# Patient Record
Sex: Female | Born: 1955 | Race: White | Hispanic: No | Smoking: Current every day smoker
Health system: Southern US, Community
[De-identification: ages and names within clinical notes are randomized; demographics above are authoritative.]

## PROBLEM LIST (undated history)

## (undated) DIAGNOSIS — K219 Gastro-esophageal reflux disease without esophagitis: Secondary | ICD-10-CM

## (undated) DIAGNOSIS — F32A Depression, unspecified: Secondary | ICD-10-CM

## (undated) DIAGNOSIS — Z22322 Carrier or suspected carrier of Methicillin resistant Staphylococcus aureus: Secondary | ICD-10-CM

## (undated) DIAGNOSIS — F419 Anxiety disorder, unspecified: Secondary | ICD-10-CM

## (undated) DIAGNOSIS — K589 Irritable bowel syndrome without diarrhea: Secondary | ICD-10-CM

## (undated) DIAGNOSIS — I8390 Asymptomatic varicose veins of unspecified lower extremity: Secondary | ICD-10-CM

## (undated) DIAGNOSIS — R0602 Shortness of breath: Secondary | ICD-10-CM

## (undated) DIAGNOSIS — J189 Pneumonia, unspecified organism: Secondary | ICD-10-CM

## (undated) DIAGNOSIS — N200 Calculus of kidney: Secondary | ICD-10-CM

## (undated) DIAGNOSIS — B182 Chronic viral hepatitis C: Secondary | ICD-10-CM

## (undated) DIAGNOSIS — L409 Psoriasis, unspecified: Secondary | ICD-10-CM

## (undated) DIAGNOSIS — K7460 Unspecified cirrhosis of liver: Secondary | ICD-10-CM

## (undated) DIAGNOSIS — F329 Major depressive disorder, single episode, unspecified: Secondary | ICD-10-CM

## (undated) DIAGNOSIS — M1990 Unspecified osteoarthritis, unspecified site: Secondary | ICD-10-CM

## (undated) DIAGNOSIS — M797 Fibromyalgia: Secondary | ICD-10-CM

## (undated) DIAGNOSIS — I10 Essential (primary) hypertension: Secondary | ICD-10-CM

## (undated) HISTORY — PX: COLONOSCOPY W/ BIOPSIES AND POLYPECTOMY: SHX1376

## (undated) HISTORY — PX: DIAGNOSTIC LAPAROSCOPY: SUR761

## (undated) HISTORY — PX: APPENDECTOMY: SHX54

## (undated) HISTORY — PX: ABDOMINAL HYSTERECTOMY: SHX81

## (undated) HISTORY — PX: HYSTEROSCOPY: SHX211

---

## 2001-06-13 ENCOUNTER — Emergency Department (HOSPITAL_COMMUNITY): Admission: EM | Admit: 2001-06-13 | Discharge: 2001-06-13 | Payer: Self-pay | Admitting: *Deleted

## 2001-06-13 ENCOUNTER — Encounter: Payer: Self-pay | Admitting: *Deleted

## 2001-08-30 ENCOUNTER — Emergency Department (HOSPITAL_COMMUNITY): Admission: EM | Admit: 2001-08-30 | Discharge: 2001-08-30 | Payer: Self-pay | Admitting: Emergency Medicine

## 2002-04-11 ENCOUNTER — Emergency Department (HOSPITAL_COMMUNITY): Admission: EM | Admit: 2002-04-11 | Discharge: 2002-04-11 | Payer: Self-pay | Admitting: *Deleted

## 2002-10-23 ENCOUNTER — Emergency Department (HOSPITAL_COMMUNITY): Admission: EM | Admit: 2002-10-23 | Discharge: 2002-10-24 | Payer: Self-pay | Admitting: *Deleted

## 2002-10-23 ENCOUNTER — Encounter: Payer: Self-pay | Admitting: *Deleted

## 2003-05-30 ENCOUNTER — Emergency Department (HOSPITAL_COMMUNITY): Admission: EM | Admit: 2003-05-30 | Discharge: 2003-05-30 | Payer: Self-pay | Admitting: Emergency Medicine

## 2007-07-27 ENCOUNTER — Emergency Department (HOSPITAL_COMMUNITY): Admission: EM | Admit: 2007-07-27 | Discharge: 2007-07-28 | Payer: Self-pay | Admitting: Emergency Medicine

## 2010-01-22 ENCOUNTER — Emergency Department (HOSPITAL_COMMUNITY): Admission: EM | Admit: 2010-01-22 | Discharge: 2010-01-22 | Payer: Self-pay | Admitting: Emergency Medicine

## 2010-01-24 ENCOUNTER — Emergency Department (HOSPITAL_COMMUNITY): Admission: EM | Admit: 2010-01-24 | Discharge: 2010-01-24 | Payer: Self-pay | Admitting: Emergency Medicine

## 2010-01-26 ENCOUNTER — Emergency Department (HOSPITAL_COMMUNITY): Admission: EM | Admit: 2010-01-26 | Discharge: 2010-01-27 | Payer: Self-pay | Admitting: Emergency Medicine

## 2010-01-29 ENCOUNTER — Emergency Department (HOSPITAL_COMMUNITY): Admission: EM | Admit: 2010-01-29 | Discharge: 2010-01-30 | Payer: Self-pay | Admitting: Emergency Medicine

## 2010-02-12 ENCOUNTER — Emergency Department (HOSPITAL_COMMUNITY): Admission: EM | Admit: 2010-02-12 | Discharge: 2010-02-12 | Payer: Self-pay | Admitting: Emergency Medicine

## 2010-02-12 ENCOUNTER — Ambulatory Visit: Payer: Self-pay | Admitting: Vascular Surgery

## 2010-02-12 ENCOUNTER — Ambulatory Visit (HOSPITAL_COMMUNITY): Admission: RE | Admit: 2010-02-12 | Discharge: 2010-02-12 | Payer: Self-pay | Admitting: Emergency Medicine

## 2010-02-12 ENCOUNTER — Encounter (INDEPENDENT_AMBULATORY_CARE_PROVIDER_SITE_OTHER): Payer: Self-pay | Admitting: Emergency Medicine

## 2010-10-20 LAB — DIFFERENTIAL
Basophils Absolute: 0.1 10*3/uL (ref 0.0–0.1)
Basophils Relative: 1 % (ref 0–1)
Lymphocytes Relative: 24 % (ref 12–46)
Monocytes Absolute: 0.9 10*3/uL (ref 0.1–1.0)
Neutro Abs: 7.5 10*3/uL (ref 1.7–7.7)
Neutrophils Relative %: 67 % (ref 43–77)

## 2010-10-20 LAB — POCT I-STAT, CHEM 8
BUN: 9 mg/dL (ref 6–23)
Calcium, Ion: 1.09 mmol/L — ABNORMAL LOW (ref 1.12–1.32)
Chloride: 105 mEq/L (ref 96–112)
Creatinine, Ser: 0.6 mg/dL (ref 0.4–1.2)
Glucose, Bld: 93 mg/dL (ref 70–99)
HCT: 48 % — ABNORMAL HIGH (ref 36.0–46.0)
Hemoglobin: 16.3 g/dL — ABNORMAL HIGH (ref 12.0–15.0)
Potassium: 4.5 mEq/L (ref 3.5–5.1)
Sodium: 139 mEq/L (ref 135–145)
TCO2: 24 mmol/L (ref 0–100)

## 2010-10-20 LAB — CBC
HCT: 45.9 % (ref 36.0–46.0)
Hemoglobin: 15 g/dL (ref 12.0–15.0)
MCV: 92.4 fL (ref 78.0–100.0)
Platelets: 194 10*3/uL (ref 150–400)
RDW: 14 % (ref 11.5–15.5)

## 2010-10-20 LAB — COMPREHENSIVE METABOLIC PANEL
Albumin: 3.8 g/dL (ref 3.5–5.2)
BUN: 8 mg/dL (ref 6–23)
Creatinine, Ser: 0.65 mg/dL (ref 0.4–1.2)
Glucose, Bld: 126 mg/dL — ABNORMAL HIGH (ref 70–99)
Total Bilirubin: 1.4 mg/dL — ABNORMAL HIGH (ref 0.3–1.2)
Total Protein: 8.8 g/dL — ABNORMAL HIGH (ref 6.0–8.3)

## 2010-10-20 LAB — CULTURE, ROUTINE-ABSCESS

## 2010-11-29 ENCOUNTER — Emergency Department (HOSPITAL_COMMUNITY): Payer: Self-pay

## 2010-11-29 ENCOUNTER — Emergency Department (HOSPITAL_COMMUNITY)
Admission: EM | Admit: 2010-11-29 | Discharge: 2010-11-29 | Disposition: A | Discharge status: Home / Self Care | Payer: Self-pay | Attending: Emergency Medicine | Admitting: Emergency Medicine

## 2010-11-29 DIAGNOSIS — Y92009 Unspecified place in unspecified non-institutional (private) residence as the place of occurrence of the external cause: Secondary | ICD-10-CM | POA: Insufficient documentation

## 2010-11-29 DIAGNOSIS — W108XXA Fall (on) (from) other stairs and steps, initial encounter: Secondary | ICD-10-CM | POA: Insufficient documentation

## 2010-11-29 DIAGNOSIS — S8990XA Unspecified injury of unspecified lower leg, initial encounter: Secondary | ICD-10-CM | POA: Insufficient documentation

## 2010-11-29 DIAGNOSIS — M25469 Effusion, unspecified knee: Secondary | ICD-10-CM | POA: Insufficient documentation

## 2011-03-10 ENCOUNTER — Encounter: Payer: Self-pay | Admitting: Oncology

## 2011-03-10 ENCOUNTER — Emergency Department (HOSPITAL_COMMUNITY): Payer: Self-pay

## 2011-03-10 ENCOUNTER — Emergency Department (HOSPITAL_COMMUNITY)
Admission: EM | Admit: 2011-03-10 | Discharge: 2011-03-10 | Disposition: A | Discharge status: Home / Self Care | Payer: Self-pay | Attending: Emergency Medicine | Admitting: Emergency Medicine

## 2011-03-10 DIAGNOSIS — J438 Other emphysema: Secondary | ICD-10-CM | POA: Insufficient documentation

## 2011-03-10 DIAGNOSIS — S63619A Unspecified sprain of unspecified finger, initial encounter: Secondary | ICD-10-CM

## 2011-03-10 DIAGNOSIS — Z760 Encounter for issue of repeat prescription: Secondary | ICD-10-CM | POA: Insufficient documentation

## 2011-03-10 DIAGNOSIS — K589 Irritable bowel syndrome without diarrhea: Secondary | ICD-10-CM | POA: Insufficient documentation

## 2011-03-10 DIAGNOSIS — M79609 Pain in unspecified limb: Secondary | ICD-10-CM | POA: Insufficient documentation

## 2011-03-10 DIAGNOSIS — S6390XA Sprain of unspecified part of unspecified wrist and hand, initial encounter: Secondary | ICD-10-CM | POA: Insufficient documentation

## 2011-03-10 HISTORY — DX: Psoriasis, unspecified: L40.9

## 2011-03-10 HISTORY — DX: Unspecified osteoarthritis, unspecified site: M19.90

## 2011-03-10 HISTORY — DX: Chronic viral hepatitis C: B18.2

## 2011-03-10 HISTORY — DX: Gastro-esophageal reflux disease without esophagitis: K21.9

## 2011-03-10 HISTORY — DX: Irritable bowel syndrome, unspecified: K58.9

## 2011-03-10 HISTORY — DX: Carrier or suspected carrier of methicillin resistant Staphylococcus aureus: Z22.322

## 2011-03-10 MED ORDER — IBUPROFEN 800 MG PO TABS
800.0000 mg | ORAL_TABLET | Freq: Once | ORAL | Status: AC
  Administered 2011-03-10: 800 mg via ORAL
  Filled 2011-03-10: qty 1

## 2011-03-10 MED ORDER — OXYCODONE HCL 5 MG PO TABS: 5.0000 mg | ORAL_TABLET | ORAL | Status: AC | PRN

## 2011-03-10 MED ORDER — ALBUTEROL SULFATE HFA 108 (90 BASE) MCG/ACT IN AERS: 1.0000 | INHALATION_SPRAY | Freq: Four times a day (QID) | RESPIRATORY_TRACT | Status: DC | PRN

## 2011-03-10 MED ORDER — OXYCODONE HCL 5 MG PO TABS
5.0000 mg | ORAL_TABLET | Freq: Once | ORAL | Status: AC
  Administered 2011-03-10: 5 mg via ORAL
  Filled 2011-03-10: qty 1

## 2011-03-10 MED ORDER — CEPHALEXIN 500 MG PO CAPS: 500.0000 mg | ORAL_CAPSULE | Freq: Four times a day (QID) | ORAL | Status: AC
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
## 2011-03-10 NOTE — ED Provider Notes (Signed)
Medical screening examination/treatment/procedure(s) were performed by non-physician practitioner and as supervising physician I was immediately available for consultation/collaboration.   Tammatha Cobb M Stephania Macfarlane, DO 03/10/11 1723 

## 2012-08-24 ENCOUNTER — Emergency Department (HOSPITAL_COMMUNITY): Payer: Medicaid Other

## 2012-08-24 ENCOUNTER — Encounter (HOSPITAL_COMMUNITY): Payer: Self-pay | Admitting: *Deleted

## 2012-08-24 ENCOUNTER — Emergency Department (HOSPITAL_COMMUNITY)
Admission: EM | Admit: 2012-08-24 | Discharge: 2012-08-24 | Disposition: A | Discharge status: Home / Self Care | Payer: Medicaid Other | Attending: Emergency Medicine | Admitting: Emergency Medicine

## 2012-08-24 DIAGNOSIS — Z872 Personal history of diseases of the skin and subcutaneous tissue: Secondary | ICD-10-CM | POA: Insufficient documentation

## 2012-08-24 DIAGNOSIS — R197 Diarrhea, unspecified: Secondary | ICD-10-CM | POA: Insufficient documentation

## 2012-08-24 DIAGNOSIS — R109 Unspecified abdominal pain: Secondary | ICD-10-CM | POA: Insufficient documentation

## 2012-08-24 DIAGNOSIS — K219 Gastro-esophageal reflux disease without esophagitis: Secondary | ICD-10-CM | POA: Insufficient documentation

## 2012-08-24 DIAGNOSIS — Z8614 Personal history of Methicillin resistant Staphylococcus aureus infection: Secondary | ICD-10-CM | POA: Insufficient documentation

## 2012-08-24 DIAGNOSIS — Z8739 Personal history of other diseases of the musculoskeletal system and connective tissue: Secondary | ICD-10-CM | POA: Insufficient documentation

## 2012-08-24 DIAGNOSIS — M545 Low back pain, unspecified: Secondary | ICD-10-CM | POA: Insufficient documentation

## 2012-08-24 DIAGNOSIS — B182 Chronic viral hepatitis C: Secondary | ICD-10-CM | POA: Insufficient documentation

## 2012-08-24 DIAGNOSIS — J449 Chronic obstructive pulmonary disease, unspecified: Secondary | ICD-10-CM

## 2012-08-24 DIAGNOSIS — J4489 Other specified chronic obstructive pulmonary disease: Secondary | ICD-10-CM | POA: Insufficient documentation

## 2012-08-24 DIAGNOSIS — F17200 Nicotine dependence, unspecified, uncomplicated: Secondary | ICD-10-CM | POA: Insufficient documentation

## 2012-08-24 DIAGNOSIS — K589 Irritable bowel syndrome without diarrhea: Secondary | ICD-10-CM | POA: Insufficient documentation

## 2012-08-24 DIAGNOSIS — R112 Nausea with vomiting, unspecified: Secondary | ICD-10-CM | POA: Insufficient documentation

## 2012-08-24 LAB — COMPREHENSIVE METABOLIC PANEL
ALT: 71 U/L — ABNORMAL HIGH (ref 0–35)
AST: 95 U/L — ABNORMAL HIGH (ref 0–37)
Alkaline Phosphatase: 90 U/L (ref 39–117)
CO2: 26 mEq/L (ref 19–32)
Calcium: 9.3 mg/dL (ref 8.4–10.5)
Chloride: 101 mEq/L (ref 96–112)
GFR calc Af Amer: >90 mL/min (ref >90)
GFR calc non Af Amer: >90 mL/min (ref >90)
Glucose, Bld: 89 mg/dL (ref 70–99)
Potassium: 4.1 mEq/L (ref 3.5–5.1)
Sodium: 137 mEq/L (ref 135–145)

## 2012-08-24 LAB — CBC WITH DIFFERENTIAL/PLATELET
Basophils Absolute: 0 10*3/uL (ref 0.0–0.1)
Eosinophils Relative: 1 % (ref 0–5)
Lymphocytes Relative: 33 % (ref 12–46)
Lymphs Abs: 1.5 10*3/uL (ref 0.7–4.0)
MCV: 94 fL (ref 78.0–100.0)
Neutro Abs: 2.7 10*3/uL (ref 1.7–7.7)
Neutrophils Relative %: 57 % (ref 43–77)
Platelets: 109 10*3/uL — ABNORMAL LOW (ref 150–400)
RBC: 4.53 MIL/uL (ref 3.87–5.11)
RDW: 14.2 % (ref 11.5–15.5)
WBC: 4.7 10*3/uL (ref 4.0–10.5)

## 2012-08-24 LAB — D-DIMER, QUANTITATIVE: D-Dimer, Quant: <0.27 ug/mL-FEU (ref 0.00–0.48)

## 2012-08-24 LAB — URINALYSIS, ROUTINE W REFLEX MICROSCOPIC
Hgb urine dipstick: NEGATIVE
Leukocytes, UA: NEGATIVE
Nitrite: NEGATIVE
Protein, ur: NEGATIVE mg/dL
Specific Gravity, Urine: 1.025 (ref 1.005–1.030)
Urobilinogen, UA: 2 mg/dL — ABNORMAL HIGH (ref 0.0–1.0)

## 2012-08-24 MED ORDER — IBUPROFEN 800 MG PO TABS: 800.0000 mg | ORAL_TABLET | Freq: Three times a day (TID) | ORAL | Status: DC

## 2012-08-24 MED ORDER — MORPHINE SULFATE 4 MG/ML IJ SOLN
4.0000 mg | Freq: Once | INTRAMUSCULAR | Status: AC
  Administered 2012-08-24: 4 mg via INTRAVENOUS
  Filled 2012-08-24: qty 1

## 2012-08-24 MED ORDER — ONDANSETRON HCL 4 MG/2ML IJ SOLN
4.0000 mg | Freq: Once | INTRAMUSCULAR | Status: AC
  Administered 2012-08-24: 4 mg via INTRAVENOUS
  Filled 2012-08-24: qty 2

## 2012-08-24 MED ORDER — OXYCODONE-ACETAMINOPHEN 5-325 MG PO TABS: 2.0000 | ORAL_TABLET | ORAL | Status: DC | PRN

## 2012-08-24 MED ORDER — SODIUM CHLORIDE 0.9 % IV BOLUS (SEPSIS)
1000.0000 mL | Freq: Once | INTRAVENOUS | Status: AC
  Administered 2012-08-24: 1000 mL via INTRAVENOUS

## 2012-08-24 MED ORDER — ALBUTEROL SULFATE HFA 108 (90 BASE) MCG/ACT IN AERS
2.0000 | INHALATION_SPRAY | Freq: Once | RESPIRATORY_TRACT | Status: AC
  Administered 2012-08-24: 2 via RESPIRATORY_TRACT
  Filled 2012-08-24: qty 6.7

## 2012-08-24 MED ORDER — ALBUTEROL SULFATE HFA 108 (90 BASE) MCG/ACT IN AERS: 2.0000 | INHALATION_SPRAY | RESPIRATORY_TRACT | Status: DC | PRN

## 2012-08-24 NOTE — ED Notes (Signed)
abd pain, and pain in knees,  Cough, no vomiting in last 3 days.  Diarrhea today.  No  injury

## 2012-08-24 NOTE — ED Notes (Signed)
MD at bedside. 

## 2012-08-24 NOTE — ED Provider Notes (Signed)
History  This chart was scribed for Glynn Octave, MD by Erskine Emery, ED Scribe. This patient was seen in room [facility]16A[facility] and the patient's care was started at 15:19.   CSN: [id number]  Arrival date & time 08/24/12  1133   First MD Initiated Contact with Patient 08/24/12 1519      Chief Complaint  Patient presents with  . Abdominal Pain    (Consider location/radiation/quality/duration/timing/severity/associated sxs/prior treatment) The history is provided by the patient. No language interpreter was used.  Kristen Marsh is a 57 y.o. female who presents to the Emergency Department complaining of intermittent pain in the lower back that radiates in a straight line with sharp pain into the abdomen for about 3 days. Pt reports the pain is worse in the mornings. She also reports some back pain that radiates into the shoulders and chest that is constant for the last couple days. Pt reports some headache when waking every morning, a cough, emesis (last episode was Saturday--4 days ago), and weakness but denies any associated fever, dysuira, hematuria, vaginal bleeding or vaginal discharge, or known associated injury. She has a h/o COPD, emphysema, arthritis, right knee pain, hepatitis C (from shooting up a few years ago), bronchitis, and kidneys stones many years ago. She thinks she may have DM but has never been formally diagnosed. She had an evaluation for possible cholecyst infection a few years ago but they did not operate. She has had an appendectomy and hysterectomy but she still has her ovaries.  Pt just got medicaid and doesn't have PCP yet.  Past Medical History  Diagnosis Date  . Osteoarthritis   . Hep C w/o coma, chronic   . Emphysema   . GERD (gastroesophageal reflux disease)   . IBS (irritable bowel syndrome)   . Psoriasis   . MRSA carrier     Past Surgical History  Procedure Date  . Hysteroscopy   . Appendectomy   . Abdominal hysterectomy     History reviewed. No  pertinent family history.  History  Substance Use Topics  . Smoking status: Current Every Day Smoker  . Smokeless tobacco: Not on file  . Alcohol Use: Yes    OB History    Grav Para Term Preterm Abortions TAB SAB Ect Mult Living                  Review of Systems A complete 10 system review of systems was obtained and all systems are negative except as noted in the HPI and PMH.    Allergies  Hydrocodone-acetaminophen  Home Medications   Current Outpatient Rx  Name  Route  Sig  Dispense  Refill  . ALBUTEROL SULFATE HFA 108 (90 BASE) MCG/ACT IN AERS   Inhalation   Inhale 1-2 puffs into the lungs every 6 (six) hours as needed for wheezing.   1 Inhaler   0   . ASPIRIN-SALICYLAMIDE-CAFFEINE 650-195-33.3 MG PO PACK   Oral   Take 1 packet by mouth daily as needed. For pain         . CIMETIDINE 200 MG PO TABS   Oral   Take 200 mg by mouth daily as needed. For acid reducer         . ESOMEPRAZOLE MAGNESIUM 40 MG PO CPDR   Oral   Take 40 mg by mouth daily before breakfast.           BP 152/84  Pulse 82  Temp 98 F (36.7 C) (Oral)  Resp 20  SpO2 95%  Physical Exam  Nursing note and vitals reviewed. Constitutional: She is oriented to person, place, and time. She appears well-developed and well-nourished. No distress.  HENT:  Head: Normocephalic and atraumatic.  Eyes: EOM are normal. Pupils are equal, round, and reactive to light.  Neck: Normal range of motion. Neck supple. No tracheal deviation present.  Cardiovascular: Normal rate.   Pulmonary/Chest: Effort normal. No respiratory distress. She has no wheezes. She exhibits tenderness.       Lungs are clear. Reproducible right sided chest pain.  Abdominal: Soft. She exhibits no distension. There is tenderness.       Right CVA tenderness. Hepatamegaly with RUQ tenderness.  Musculoskeletal: Normal range of motion. She exhibits no edema.       5/5 strength in bilateral lower extremities. Ankle plantar and  dorsiflexion intact. Great toe extension intact bilaterally. +2 DP and PT pulses. +2 patellar reflexes bilaterally. Normal gait.  Neurological: She is alert and oriented to person, place, and time.  Skin: Skin is warm and dry.  Psychiatric: She has a normal mood and affect.    ED Course  Procedures (including critical care time) DIAGNOSTIC STUDIES: Oxygen Saturation is 95% on room air, adequate by my interpretation.    COORDINATION OF CARE: 15:25--I evaluated the patient and we discussed a treatment plan including X-ray of the chest, cholecyst ultrasound, urinalysis, blood work, and IV nausea medication to which the pt agreed. Pt requests an inhaler for her cough and a list of doctors that could be her new PCP.  17:51--I rechecked the pt and notified her of the results of her exams, including no pneumonia or kidney stones. I told her she needs to stop smoking and get a PCP. I explained that we would send her home with some pain medication and antiinflammatories.   Labs Reviewed  URINALYSIS, ROUTINE W REFLEX MICROSCOPIC - Abnormal; Notable for the following:    Urobilinogen, UA 2.0 (*)     All other components within normal limits  CBC WITH DIFFERENTIAL - Abnormal; Notable for the following:    Platelets 109 (*)     All other components within normal limits  COMPREHENSIVE METABOLIC PANEL - Abnormal; Notable for the following:    Total Protein 8.5 (*)     AST 95 (*)     ALT 71 (*)     Total Bilirubin 1.3 (*)     All other components within normal limits  LIPASE, BLOOD  TROPONIN I  D-DIMER, QUANTITATIVE   Dg Chest 2 View  08/24/2012  *RADIOLOGY REPORT*  Clinical Data: Cough  CHEST - 2 VIEW  Comparison: None.  Findings: Cardiomediastinal silhouette is unremarkable.  No acute infiltrate or pleural effusion.  No pulmonary edema.  Bony thorax is unremarkable.  IMPRESSION: No active disease.   Original Report Authenticated By: Natasha Mead, M.D.    US Abdomen Limited Ruq  08/24/2012   *RADIOLOGY REPORT*  Clinical Data:  57 year old female with right-sided abdominal pain.  LIMITED ABDOMINAL ULTRASOUND - RIGHT UPPER QUADRANT  Comparison:  None  Findings:  Gallbladder:  A tiny amount of sludge is noted. There is no evidence of cholelithiasis or cholecystitis.  Common bile duct:  There is no evidence of intrahepatic or extrahepatic biliary dilatation.  The visualized CBD is unremarkable measuring 1.6 mm in greatest diameter.  Liver:  Mildly heterogeneous increased parenchymal echogenicity is noted. The liver is enlarged measuring 19 cm in cranial caudal dimension. No focal hepatic masses are identified.  There is no evidence  of ascites within the upper right abdomen.  IMPRESSION: No evidence of acute abnormality.  Tiny amount of gallbladder sludge without evidence of cholecystitis or cholelithiasis.  Hepatomegaly with probable fatty infiltration of the liver.                    Original Report Authenticated By: Harmon Pier, M.D.      No diagnosis found.    MDM  3 history of right flank pain it radiates to the lower abdomen associated with nausea and loose stools. Denies dysuria, hematuria, vaginal bleeding or discharge. Remote history of kidney stones.  Coughing with chest pain, hx COPD. Atypical for ACS and PE. Hepatomegaly with hepatitis C history. Neuro exam nonfocal.   Urinalysis negative. No evidence of hematuria. Suspect back pain musculoskeletal as it is reproducible. She has equal strength in her lower extremities, no bowel or bladder incontinence, no saddle anesthesia or other red flags. Abdomen is soft and benign. Ultrasound was obtained given her hepatomegaly. Gallbladder appears to be normal.  Chest x-ray is clear she has a history of COPD without acute exacerbation. We'll continue her home medications. Referral for PCP given.   Date: 08/24/2012  Rate: 70  Rhythm: sinus arrhythmia  QRS Axis: normal  Intervals: normal  ST/T Wave abnormalities: normal  Conduction  Disutrbances:none  Narrative Interpretation:   Old EKG Reviewed: unchanged   I personally performed the services described in this documentation, which was scribed in my presence. The recorded information has been reviewed and is accurate.      Glynn Octave, MD 08/24/12 [id number]

## 2013-01-14 ENCOUNTER — Ambulatory Visit (INDEPENDENT_AMBULATORY_CARE_PROVIDER_SITE_OTHER): Payer: Medicaid Other | Admitting: General Practice

## 2013-01-14 ENCOUNTER — Encounter: Payer: Self-pay | Admitting: General Practice

## 2013-01-14 VITALS — BP 156/91 | HR 78 | Temp 97.4°F | Ht 62.75 in | Wt 140.0 lb

## 2013-01-14 DIAGNOSIS — J309 Allergic rhinitis, unspecified: Secondary | ICD-10-CM

## 2013-01-14 DIAGNOSIS — R079 Chest pain, unspecified: Secondary | ICD-10-CM

## 2013-01-14 DIAGNOSIS — Z7689 Persons encountering health services in other specified circumstances: Secondary | ICD-10-CM

## 2013-01-14 DIAGNOSIS — Z7189 Other specified counseling: Secondary | ICD-10-CM

## 2013-01-14 DIAGNOSIS — M129 Arthropathy, unspecified: Secondary | ICD-10-CM

## 2013-01-14 DIAGNOSIS — M1990 Unspecified osteoarthritis, unspecified site: Secondary | ICD-10-CM

## 2013-01-14 LAB — COMPLETE METABOLIC PANEL WITH GFR
ALT: 93 U/L — ABNORMAL HIGH (ref 0–35)
AST: 120 U/L — ABNORMAL HIGH (ref 0–37)
Alkaline Phosphatase: 98 U/L (ref 39–117)
CO2: 26 mEq/L (ref 19–32)
Creat: 0.67 mg/dL (ref 0.50–1.10)
GFR, Est African American: >89 mL/min
Total Bilirubin: 1.2 mg/dL (ref 0.3–1.2)

## 2013-01-14 LAB — POCT CBC
Lymph, poc: 2.6 (ref 0.6–3.4)
MCH, POC: 31.5 pg — AB (ref 27–31.2)
MCHC: 34.6 g/dL (ref 31.8–35.4)
MPV: 7.9 fL (ref 0–99.8)
POC Granulocyte: 7 — AB (ref 2–6.9)
POC LYMPH PERCENT: 25.7 %L (ref 10–50)
Platelet Count, POC: 133 10*3/uL — AB (ref 142–424)
RDW, POC: 15 %
WBC: 10.1 10*3/uL (ref 4.6–10.2)

## 2013-01-14 LAB — VITAMIN B12: Vitamin B-12: 905 pg/mL (ref 211–911)

## 2013-01-14 MED ORDER — MELOXICAM 7.5 MG PO TABS: 7.5000 mg | ORAL_TABLET | Freq: Every day | ORAL | Status: DC

## 2013-01-14 MED ORDER — OMEPRAZOLE 20 MG PO CPDR: 20.0000 mg | DELAYED_RELEASE_CAPSULE | Freq: Every day | ORAL | Status: DC

## 2013-01-14 MED ORDER — CETIRIZINE HCL 10 MG PO TABS: 10.0000 mg | ORAL_TABLET | Freq: Every day | ORAL | Status: DC

## 2013-01-14 NOTE — Patient Instructions (Signed)
Degenerative Arthritis You have osteoarthritis. This is the wear and tear arthritis that comes with aging. It is also called degenerative arthritis. This is common in people past middle age. It is caused by stress on the joints. The large weight bearing joints of the lower extremities are most often affected. The knees, hips, back, neck, and hands can become painful, swollen, and stiff. This is the most common type of arthritis. It comes on with age, carrying too much weight, or from an injury. Treatment includes resting the sore joint until the pain and swelling improve. Crutches or a walker may be needed for severe flares. Only take over-the-counter or prescription medicines for pain, discomfort, or fever as directed by your caregiver. Local heat therapy may improve motion. Cortisone shots into the joint are sometimes used to reduce pain and swelling during flares. Osteoarthritis is usually not crippling and progresses slowly. There are things you can do to decrease pain:  Avoid high impact activities.  Exercise regularly.  Low impact exercises such as walking, biking and swimming help to keep the muscles strong and keep normal joint function.  Stretching helps to keep your range of motion.  Lose weight if you are overweight. This reduces joint stress. In severe cases when you have pain at rest or increasing disability, joint surgery may be helpful. See your caregiver for follow-up treatment as recommended.  SEEK IMMEDIATE MEDICAL CARE IF:   You have severe joint pain.  Marked swelling and redness in your joint develops.  You develop a high fever. Document Released: 07/21/2005 Document Revised: 10/13/2011 Document Reviewed: 12/21/2006 [facility] Patient Information 2014 [place], [place]. Allergic Rhinitis Allergic rhinitis is when the mucous membranes in the nose respond to allergens. Allergens are particles in the air that cause your body to have an allergic reaction. This causes you to  release allergic antibodies. Through a chain of events, these eventually cause you to release histamine into the blood stream (hence the use of antihistamines). Although meant to be protective to the body, it is this release that causes your discomfort, such as frequent sneezing, congestion and an itchy runny nose.  CAUSES  The pollen allergens may come from grasses, trees, and weeds. This is seasonal allergic rhinitis, or "hay fever." Other allergens cause year-round allergic rhinitis (perennial allergic rhinitis) such as house dust mite allergen, pet dander and mold spores.  SYMPTOMS   Nasal stuffiness (congestion).  Runny, itchy nose with sneezing and tearing of the eyes.  There is often an itching of the mouth, eyes and ears. It cannot be cured, but it can be controlled with medications. DIAGNOSIS  If you are unable to determine the offending allergen, skin or blood testing may find it. TREATMENT   Avoid the allergen.  Medications and allergy shots (immunotherapy) can help.  Hay fever may often be treated with antihistamines in pill or nasal spray forms. Antihistamines block the effects of histamine. There are over-the-counter medicines that may help with nasal congestion and swelling around the eyes. Check with your caregiver before taking or giving this medicine. If the treatment above does not work, there are many new medications your caregiver can prescribe. Stronger medications may be used if initial measures are ineffective. Desensitizing injections can be used if medications and avoidance fails. Desensitization is when a patient is given ongoing shots until the body becomes less sensitive to the allergen. Make sure you follow up with your caregiver if problems continue. SEEK MEDICAL CARE IF:   You develop fever (more than 100.5  F (38.1 C).  You develop a cough that does not stop easily (persistent).  You have shortness of breath.  You start wheezing.  Symptoms interfere  with normal daily activities. Document Released: 04/15/2001 Document Revised: 10/13/2011 Document Reviewed: 10/25/2008 [facility] Patient Information 2014 [place], [place].

## 2013-01-14 NOTE — Progress Notes (Signed)
  Subjective:    Patient ID: Kristen Marsh, female    DOB: Sep 04, 1955, 57 y.o.   MRN: [id number]  HPI Presents today for establishment of care. She denies having a primary physician in many years and has recently been visiting urgent cares/emergency rooms. She reports being COPD and is currently using proventil. She reports smoking one pack daily. She denies trying any otc treatment for smoking cessation. Reports having a burning sensation after eating. Also belching. She reports a history of GERD. Reports having a headache at times, runny nose, and periodic watering of eyes. She reports taking antihistamines in the past. Reports being more stiff in morning and begins to loosen up as day goes on. Reports being seen recently by spine center/ortho and [facility]. She is currently receiving physical therapy, per patient for back pain.  Reports having a history of being a daily drinker of alcohol. Currently reports drinking mixed drink twice weekly.    Review of Systems  Constitutional: Negative for fever and chills.  HENT: Positive for neck pain and neck stiffness.        Reports seeing an orthopedic for neck and back pain, reports being effective  Respiratory: Positive for cough. Negative for chest tightness.   Cardiovascular: Negative for chest pain and palpitations.  Gastrointestinal: Negative for abdominal pain and blood in stool.  Genitourinary: Negative for dysuria, hematuria, flank pain and difficulty urinating.  Musculoskeletal: Positive for back pain.  Neurological: Negative for dizziness, weakness, numbness and headaches.       Objective:   Physical Exam  Constitutional: She is oriented to person, place, and time. She appears well-developed and well-nourished.  HENT:  Head: Normocephalic and atraumatic.  Right Ear: External ear normal.  Left Ear: External ear normal.  Eyes: EOM are normal.  Cardiovascular: Normal rate, regular rhythm and normal heart sounds.    Pulmonary/Chest: Effort normal. No respiratory distress. She exhibits no tenderness.  Abdominal: Soft. Bowel sounds are normal. She exhibits no distension. There is no tenderness.  Musculoskeletal:  Some deformity noted to fingers from arthritis. weak grip with bilateral hands/fingers,   Neurological: She is alert and oriented to person, place, and time.  Skin: Skin is warm and dry.  Psychiatric: She has a normal mood and affect.          Assessment & Plan:  -Provider will request medical records from [facility] in [place] and [place]  -Smoking cessation discussed (needs to try OTC treatment prior to prescription)  1. Establishing care with new doctor, encounter for - omeprazole (PRILOSEC) 20 MG capsule; Take 1 capsule (20 mg total) by mouth daily.  Dispense: 30 capsule; Refill: 0 - POCT CBC - COMPLETE METABOLIC PANEL WITH GFR - Vitamin D 25 hydroxy - Vitamin B12 - NMR Lipoprofile with Lipids  2. Osteoarthrosis, unspecified whether generalized or localized, involving lower leg - meloxicam (MOBIC) 7.5 MG tablet; Take 1 tablet (7.5 mg total) by mouth daily.  Dispense: 30 tablet; Refill: 0  3. Allergic rhinitis - cetirizine (ZYRTEC ALLERGY) 10 MG tablet; Take 1 tablet (10 mg total) by mouth daily.  Dispense: 30 tablet; Refill: 0  -discussed healthy eating habits -discussed regular exercise -will follow up after medical records received -RTO if symptoms worsen -Patient verbalized understanding -Coralie Keens, FNP-C

## 2013-01-17 LAB — NMR LIPOPROFILE WITH LIPIDS
HDL Size: 10.8 nm (ref >=9.2)
LDL (calc): 34 mg/dL (ref <100)
LDL Particle Number: <300 nmol/L (ref <1000)
Large VLDL-P: 2.5 nmol/L (ref <=2.7)
Triglycerides: 45 mg/dL (ref <150)
VLDL Size: 67.5 nm — ABNORMAL HIGH (ref <=46.6)

## 2013-01-24 ENCOUNTER — Telehealth: Payer: Self-pay | Admitting: General Practice

## 2013-01-25 NOTE — Telephone Encounter (Signed)
Derry Skill working on

## 2013-01-25 NOTE — Telephone Encounter (Signed)
Kristen Marsh working on this 

## 2013-02-01 ENCOUNTER — Other Ambulatory Visit: Payer: Self-pay | Admitting: General Practice

## 2013-02-01 ENCOUNTER — Telehealth: Payer: Self-pay | Admitting: General Practice

## 2013-02-01 DIAGNOSIS — M1990 Unspecified osteoarthritis, unspecified site: Secondary | ICD-10-CM

## 2013-02-01 MED ORDER — MELOXICAM 15 MG PO TABS: 15.0000 mg | ORAL_TABLET | Freq: Every day | ORAL | Status: DC

## 2013-02-01 NOTE — Telephone Encounter (Signed)
Please advise 

## 2013-02-01 NOTE — Telephone Encounter (Signed)
Please inform patient that she is taking mobic and it has been increased to 15mg  daily. She shouldn't take both ibuprofen and mobic.

## 2013-02-01 NOTE — Telephone Encounter (Signed)
Spoke with niece and she is aware

## 2013-02-14 ENCOUNTER — Ambulatory Visit: Payer: Medicaid Other | Admitting: General Practice

## 2013-02-15 ENCOUNTER — Other Ambulatory Visit: Payer: Self-pay | Admitting: General Practice

## 2013-02-15 DIAGNOSIS — R748 Abnormal levels of other serum enzymes: Secondary | ICD-10-CM

## 2013-02-15 NOTE — Progress Notes (Signed)
Patient states that she has an appt on Friday and wants to wait until then.

## 2013-02-17 ENCOUNTER — Other Ambulatory Visit: Payer: Self-pay | Admitting: General Practice

## 2013-02-18 ENCOUNTER — Telehealth: Payer: Self-pay | Admitting: General Practice

## 2013-02-18 ENCOUNTER — Ambulatory Visit (INDEPENDENT_AMBULATORY_CARE_PROVIDER_SITE_OTHER): Payer: Medicaid Other | Admitting: General Practice

## 2013-02-18 ENCOUNTER — Encounter: Payer: Self-pay | Admitting: General Practice

## 2013-02-18 VITALS — BP 157/96 | HR 88 | Temp 98.2°F | Wt 150.0 lb

## 2013-02-18 DIAGNOSIS — Z7689 Persons encountering health services in other specified circumstances: Secondary | ICD-10-CM

## 2013-02-18 DIAGNOSIS — M797 Fibromyalgia: Secondary | ICD-10-CM

## 2013-02-18 DIAGNOSIS — M1990 Unspecified osteoarthritis, unspecified site: Secondary | ICD-10-CM

## 2013-02-18 DIAGNOSIS — J309 Allergic rhinitis, unspecified: Secondary | ICD-10-CM

## 2013-02-18 DIAGNOSIS — K219 Gastro-esophageal reflux disease without esophagitis: Secondary | ICD-10-CM

## 2013-02-18 DIAGNOSIS — Z7189 Other specified counseling: Secondary | ICD-10-CM

## 2013-02-18 DIAGNOSIS — IMO0001 Reserved for inherently not codable concepts without codable children: Secondary | ICD-10-CM

## 2013-02-18 DIAGNOSIS — M129 Arthropathy, unspecified: Secondary | ICD-10-CM

## 2013-02-18 DIAGNOSIS — R748 Abnormal levels of other serum enzymes: Secondary | ICD-10-CM

## 2013-02-18 DIAGNOSIS — I10 Essential (primary) hypertension: Secondary | ICD-10-CM

## 2013-02-18 MED ORDER — PREGABALIN 75 MG PO CAPS: 75.0000 mg | ORAL_CAPSULE | Freq: Two times a day (BID) | ORAL | Status: DC

## 2013-02-18 MED ORDER — HYDROCHLOROTHIAZIDE 25 MG PO TABS: 25.0000 mg | ORAL_TABLET | Freq: Every day | ORAL | Status: DC

## 2013-02-18 MED ORDER — OMEPRAZOLE 20 MG PO CPDR: 20.0000 mg | DELAYED_RELEASE_CAPSULE | Freq: Every day | ORAL | Status: DC

## 2013-02-18 MED ORDER — CETIRIZINE HCL 10 MG PO TABS: 10.0000 mg | ORAL_TABLET | Freq: Every day | ORAL | Status: DC

## 2013-02-18 NOTE — Patient Instructions (Addendum)

## 2013-02-18 NOTE — Progress Notes (Signed)
  Subjective:    Patient ID: Kristen Marsh, female    DOB: 08-27-1955, 57 y.o.   MRN: [id number]  HPI Patient presents today for follow up. She reports being seen by rheumatologist and has a follow up appointment on August 4. Patient reports rheumatologist gave her an injection in left shoulder for pain, but no oral medication. Patient reports having an appointment with a GI specialist next week. She reports having pain from fibromyalgia and she is taking was taking ultram, but it made bad and swell. She reports the swelling in her face has resolved. She reports stopping the tramadol. Reports pain to be 10 on 1-10 scale.     Review of Systems  Constitutional: Negative for fever and chills.  HENT: Negative for neck pain.   Respiratory: Negative for chest tightness and shortness of breath.   Cardiovascular: Negative for chest pain and palpitations.  Gastrointestinal: Positive for abdominal pain. Negative for nausea, vomiting, blood in stool and abdominal distention.       Periodic abdominal pain, but scheduled to see GI next week  Musculoskeletal: Positive for myalgias and arthralgias.       Generalized body aches from arthritis and fibromyalgia  Neurological: Negative for dizziness, weakness and headaches.       Objective:   Physical Exam  Constitutional: She is oriented to person, place, and time. She appears well-developed and well-nourished.  HENT:  Head: Normocephalic and atraumatic.  Right Ear: External ear normal.  Left Ear: External ear normal.  Mouth/Throat: Oropharynx is clear and moist.  Cardiovascular: Normal rate, regular rhythm and normal heart sounds.   Pulmonary/Chest: Effort normal and breath sounds normal. No respiratory distress. She exhibits no tenderness.  Musculoskeletal: She exhibits tenderness.  Generalized pain noted upon palpation   Neurological: She is alert and oriented to person, place, and time.  Skin: Skin is warm and dry.  Psychiatric: She has a normal  mood and affect.          Assessment & Plan:  1. Allergic rhinitis  2. Essential hypertension, benign - hydrochlorothiazide (HYDRODIURIL) 25 MG tablet; Take 1 tablet (25 mg total) by mouth daily.  Dispense: 30 tablet; Refill: 0  3. GERD (gastroesophageal reflux disease) - omeprazole (PRILOSEC) 20 MG capsule; Take 1 capsule (20 mg total) by mouth daily.  Dispense: 30 capsule; Refill: 3  4. Arthritis  5. Fibromyalgia - pregabalin (LYRICA) 75 MG capsule; Take 1 capsule (75 mg total) by mouth 2 (two) times daily.  Dispense: 60 capsule; Refill: 0  6. Elevated liver enzymes - Acute Hep Panel & Hep B Surface Ab -Maintain blood pressure diary -RTO for follow up in one week for evaluation of lyrica -side effects discussed about lyrica -healthy eating habits discussed -Patient and caregiver verbalized understanding Coralie Keens, FNP-C

## 2013-02-19 LAB — ACUTE HEP PANEL AND HEP B SURFACE AB: Hep B C IgM: NEGATIVE

## 2013-02-22 NOTE — Telephone Encounter (Signed)
Please inform patient that Lyrica was prescribed for her on 7/18, during last visit. thx

## 2013-02-22 NOTE — Telephone Encounter (Signed)
Pt aware.

## 2013-03-03 ENCOUNTER — Ambulatory Visit: Payer: Medicaid Other | Admitting: General Practice

## 2013-06-07 ENCOUNTER — Other Ambulatory Visit: Payer: Self-pay

## 2013-06-07 DIAGNOSIS — I10 Essential (primary) hypertension: Secondary | ICD-10-CM

## 2013-06-07 MED ORDER — HYDROCHLOROTHIAZIDE 25 MG PO TABS: 25.0000 mg | ORAL_TABLET | Freq: Every day | ORAL | Status: DC

## 2013-08-08 ENCOUNTER — Ambulatory Visit: Payer: Medicaid Other | Admitting: General Practice

## 2013-08-16 ENCOUNTER — Encounter: Payer: Self-pay | Admitting: General Practice

## 2013-08-16 ENCOUNTER — Ambulatory Visit (INDEPENDENT_AMBULATORY_CARE_PROVIDER_SITE_OTHER): Payer: Medicaid Other

## 2013-08-16 ENCOUNTER — Encounter (INDEPENDENT_AMBULATORY_CARE_PROVIDER_SITE_OTHER): Payer: Self-pay

## 2013-08-16 ENCOUNTER — Ambulatory Visit (INDEPENDENT_AMBULATORY_CARE_PROVIDER_SITE_OTHER): Payer: Medicaid Other | Admitting: General Practice

## 2013-08-16 VITALS — BP 149/86 | HR 109 | Temp 98.4°F | Ht 62.75 in | Wt 167.0 lb

## 2013-08-16 DIAGNOSIS — R5381 Other malaise: Secondary | ICD-10-CM

## 2013-08-16 DIAGNOSIS — R109 Unspecified abdominal pain: Secondary | ICD-10-CM

## 2013-08-16 DIAGNOSIS — J029 Acute pharyngitis, unspecified: Secondary | ICD-10-CM

## 2013-08-16 DIAGNOSIS — M797 Fibromyalgia: Secondary | ICD-10-CM

## 2013-08-16 DIAGNOSIS — R5383 Other fatigue: Secondary | ICD-10-CM

## 2013-08-16 DIAGNOSIS — IMO0001 Reserved for inherently not codable concepts without codable children: Secondary | ICD-10-CM

## 2013-08-16 DIAGNOSIS — IMO0002 Reserved for concepts with insufficient information to code with codable children: Secondary | ICD-10-CM

## 2013-08-16 DIAGNOSIS — R252 Cramp and spasm: Secondary | ICD-10-CM

## 2013-08-16 DIAGNOSIS — R1011 Right upper quadrant pain: Secondary | ICD-10-CM

## 2013-08-16 DIAGNOSIS — K7460 Unspecified cirrhosis of liver: Secondary | ICD-10-CM

## 2013-08-16 DIAGNOSIS — K219 Gastro-esophageal reflux disease without esophagitis: Secondary | ICD-10-CM

## 2013-08-16 LAB — POCT UA - MICROSCOPIC ONLY
CASTS, UR, LPF, POC: NEGATIVE
CRYSTALS, UR, HPF, POC: NEGATIVE
Mucus, UA: NEGATIVE
YEAST UA: NEGATIVE

## 2013-08-16 LAB — POCT URINALYSIS DIPSTICK
Bilirubin, UA: NEGATIVE
GLUCOSE UA: NEGATIVE
Ketones, UA: NEGATIVE
Leukocytes, UA: NEGATIVE
NITRITE UA: NEGATIVE
SPEC GRAV UA: 1.01
UROBILINOGEN UA: NEGATIVE
pH, UA: 6

## 2013-08-16 MED ORDER — KETOROLAC TROMETHAMINE 60 MG/2ML IM SOLN
60.0000 mg | Freq: Once | INTRAMUSCULAR | Status: AC
  Administered 2013-08-16: 60 mg via INTRAMUSCULAR

## 2013-08-16 NOTE — Progress Notes (Signed)
Subjective:    Patient ID: Kristen Marsh, female    DOB: 05-06-1956, 58 y.o.   MRN: [id number]  Abdominal Pain This is a recurrent problem. Episode onset: onset 3-4 months ago. The onset quality is sudden. The problem occurs 2 to 4 times per day. The problem has been waxing and waning. The pain is located in the RUQ. The pain is at a severity of 6/10. The quality of the pain is sharp and aching. The abdominal pain radiates to the right flank and left flank. Pertinent negatives include no constipation, diarrhea, fever, frequency, headaches or nausea. The pain is aggravated by coughing. The pain is relieved by nothing. There is no history of colon cancer or GERD.       Review of Systems  Constitutional: Negative for fever and chills.  Respiratory: Negative for chest tightness and shortness of breath.   Cardiovascular: Negative for chest pain and palpitations.  Gastrointestinal: Positive for abdominal pain. Negative for nausea, diarrhea and constipation.  Genitourinary: Negative for frequency.  Neurological: Negative for dizziness, weakness and headaches.       Objective:   Physical Exam  Constitutional: She is oriented to person, place, and time. She appears well-developed and well-nourished.  Cardiovascular: Regular rhythm and normal heart sounds.  Tachycardia present.   Pulmonary/Chest: Effort normal and breath sounds normal. No respiratory distress. She exhibits no tenderness.  Abdominal: Soft. Bowel sounds are normal. She exhibits no distension. There is tenderness in the right upper quadrant.  Neurological: She is alert and oriented to person, place, and time.  Skin: Skin is warm and dry.  Psychiatric: She has a normal mood and affect.      Results for orders placed in visit on 08/16/13  POCT URINALYSIS DIPSTICK      Result Value Range   Color, UA gold     Clarity, UA clear     Glucose, UA negative     Bilirubin, UA negative     Ketones, UA negative     Spec Grav, UA  1.010     Blood, UA trace     pH, UA 6.0     Protein, UA trace     Urobilinogen, UA negative     Nitrite, UA negative     Leukocytes, UA Negative    POCT UA - MICROSCOPIC ONLY      Result Value Range   WBC, Ur, HPF, POC 1-3     RBC, urine, microscopic rare     Bacteria, U Microscopic few     Mucus, UA negative     Epithelial cells, urine per micros few     Crystals, Ur, HPF, POC negative     Casts, Ur, LPF, POC negative     Yeast, UA negative     [facility] reading (PRIMARY) by Erby Pian, FNP-C, no acute changes on abdominal xray. [facility] reading (PRIMARY) by Erby Pian, FNP-C, no active disease process noted on chest xray.    Assessment & Plan:  1. Abdominal pain, right upper quadrant  - DG Chest 2 View; Future - DG Abd 1 View; Future - ketorolac (TORADOL) injection 60 mg; Inject 2 mLs (60 mg total) into the muscle once. - NM Hepatobiliary; Future  2. Flank pain  - POCT urinalysis dipstick - POCT UA - Microscopic Only  3. Muscle cramps  - CMP14+EGFR  4. Sore throat  - POCT rapid strep A  5. Malaise and fatigue  - Thyroid Panel With TSH - Vitamin B12 -  RTO if symptoms worsen -Maintain scheduled appointments with gastroenterology and rheumatology -may seek emergency medical treatment  -Patient and guardian verbalized understanding Erby Pian, FNP-C

## 2013-08-16 NOTE — Patient Instructions (Signed)
Abdominal Pain, Adult °Many things can cause abdominal pain. Usually, abdominal pain is not caused by a disease and will improve without treatment. It can often be observed and treated at home. Your health care provider will do a physical exam and possibly order blood tests and X-rays to help determine the seriousness of your pain. However, in many cases, more time must pass before a clear cause of the pain can be found. Before that point, your health care provider may not know if you need more testing or further treatment. °HOME CARE INSTRUCTIONS  °Monitor your abdominal pain for any changes. The following actions may help to alleviate any discomfort you are experiencing: °· Only take over-the-counter or prescription medicines as directed by your health care provider. °· Do not take laxatives unless directed to do so by your health care provider. °· Try a clear liquid diet (broth, tea, or water) as directed by your health care provider. Slowly move to a bland diet as tolerated. °SEEK MEDICAL CARE IF: °· You have unexplained abdominal pain. °· You have abdominal pain associated with nausea or diarrhea. °· You have pain when you urinate or have a bowel movement. °· You experience abdominal pain that wakes you in the night. °· You have abdominal pain that is worsened or improved by eating food. °· You have abdominal pain that is worsened with eating fatty foods. °SEEK IMMEDIATE MEDICAL CARE IF:  °· Your pain does not go away within 2 hours. °· You have a fever. °· You keep throwing up (vomiting). °· Your pain is felt only in portions of the abdomen, such as the right side or the left lower portion of the abdomen. °· You pass bloody or black tarry stools. °MAKE SURE YOU: °· Understand these instructions.   °· Will watch your condition.   °· Will get help right away if you are not doing well or get worse.   °Document Released: 04/30/2005 Document Revised: 05/11/2013 Document Reviewed: 03/30/2013 °Exit[employer]® Patient  Information ©2014 [employer]. ° °

## 2013-08-18 DIAGNOSIS — K219 Gastro-esophageal reflux disease without esophagitis: Secondary | ICD-10-CM | POA: Insufficient documentation

## 2013-08-18 DIAGNOSIS — M797 Fibromyalgia: Secondary | ICD-10-CM | POA: Insufficient documentation

## 2013-08-18 DIAGNOSIS — K7460 Unspecified cirrhosis of liver: Secondary | ICD-10-CM | POA: Insufficient documentation

## 2013-08-18 DIAGNOSIS — M5030 Other cervical disc degeneration, unspecified cervical region: Secondary | ICD-10-CM | POA: Insufficient documentation

## 2013-08-18 DIAGNOSIS — R252 Cramp and spasm: Secondary | ICD-10-CM | POA: Insufficient documentation

## 2013-08-18 LAB — CMP14+EGFR
ALBUMIN: 3.4 g/dL — AB (ref 3.5–5.5)
ALT: 137 IU/L — AB (ref 0–32)
AST: 274 IU/L — ABNORMAL HIGH (ref 0–40)
Albumin/Globulin Ratio: 0.8 — ABNORMAL LOW (ref 1.1–2.5)
Alkaline Phosphatase: 137 IU/L — ABNORMAL HIGH (ref 39–117)
BILIRUBIN TOTAL: 1.5 mg/dL — AB (ref 0.0–1.2)
BUN/Creatinine Ratio: 11 (ref 9–23)
BUN: 7 mg/dL (ref 6–24)
CALCIUM: 8.7 mg/dL (ref 8.7–10.2)
CHLORIDE: 102 mmol/L (ref 97–108)
CO2: 25 mmol/L (ref 18–29)
Creatinine, Ser: 0.65 mg/dL (ref 0.57–1.00)
GFR calc non Af Amer: 100 mL/min/{1.73_m2} (ref >59)
GFR, EST AFRICAN AMERICAN: 116 mL/min/{1.73_m2} (ref >59)
GLUCOSE: 88 mg/dL (ref 65–99)
Globulin, Total: 4.5 g/dL (ref 1.5–4.5)
POTASSIUM: 4.4 mmol/L (ref 3.5–5.2)
Sodium: 139 mmol/L (ref 134–144)
TOTAL PROTEIN: 7.9 g/dL (ref 6.0–8.5)

## 2013-08-19 LAB — THYROID PANEL WITH TSH
FREE THYROXINE INDEX: 2.1 (ref 1.2–4.9)
T3 Uptake Ratio: 27 % (ref 24–39)
T4, Total: 7.9 ug/dL (ref 4.5–12.0)
TSH: 1.99 u[IU]/mL (ref 0.450–4.500)

## 2013-08-19 LAB — SPECIMEN STATUS REPORT

## 2013-08-19 LAB — VITAMIN B12: Vitamin B-12: >1999 pg/mL — ABNORMAL HIGH (ref 211–946)

## 2013-08-24 ENCOUNTER — Telehealth: Payer: Self-pay | Admitting: General Practice

## 2013-08-25 ENCOUNTER — Other Ambulatory Visit: Payer: Self-pay

## 2013-08-25 DIAGNOSIS — R109 Unspecified abdominal pain: Secondary | ICD-10-CM

## 2013-08-31 ENCOUNTER — Telehealth: Payer: Self-pay | Admitting: *Deleted

## 2013-09-06 ENCOUNTER — Encounter (HOSPITAL_COMMUNITY)
Admission: RE | Admit: 2013-09-06 | Discharge: 2013-09-06 | Disposition: A | Discharge status: Home / Self Care | Payer: Medicaid Other | Source: Ambulatory Visit | Attending: General Practice | Admitting: General Practice

## 2013-09-06 DIAGNOSIS — R109 Unspecified abdominal pain: Secondary | ICD-10-CM

## 2013-09-06 MED ORDER — TECHNETIUM TC 99M MEBROFENIN IV KIT
5.5000 | PACK | Freq: Once | INTRAVENOUS | Status: AC | PRN
  Administered 2013-09-06: 6 via INTRAVENOUS

## 2013-09-06 MED ORDER — SINCALIDE 5 MCG IJ SOLR: 0.0200 ug/kg | Freq: Once | INTRAMUSCULAR | Status: DC

## 2013-09-08 ENCOUNTER — Telehealth: Payer: Self-pay | Admitting: General Practice

## 2013-09-08 NOTE — Telephone Encounter (Signed)
Aware,no results yet.

## 2013-10-03 ENCOUNTER — Telehealth: Payer: Self-pay | Admitting: General Practice

## 2013-10-03 NOTE — Telephone Encounter (Signed)
Patient stated she was extremely bloated having a lot of abdominal pain and bruising really bad. I advised patient to go to the ER for evaluation.

## 2013-10-04 ENCOUNTER — Emergency Department (HOSPITAL_COMMUNITY)
Admission: EM | Admit: 2013-10-04 | Discharge: 2013-10-04 | Disposition: A | Discharge status: Home / Self Care | Payer: Medicaid Other | Attending: Emergency Medicine | Admitting: Emergency Medicine

## 2013-10-04 ENCOUNTER — Encounter (HOSPITAL_COMMUNITY): Payer: Self-pay | Admitting: Emergency Medicine

## 2013-10-04 DIAGNOSIS — Z90710 Acquired absence of both cervix and uterus: Secondary | ICD-10-CM | POA: Insufficient documentation

## 2013-10-04 DIAGNOSIS — F17200 Nicotine dependence, unspecified, uncomplicated: Secondary | ICD-10-CM | POA: Insufficient documentation

## 2013-10-04 DIAGNOSIS — K219 Gastro-esophageal reflux disease without esophagitis: Secondary | ICD-10-CM | POA: Insufficient documentation

## 2013-10-04 DIAGNOSIS — K7460 Unspecified cirrhosis of liver: Secondary | ICD-10-CM | POA: Insufficient documentation

## 2013-10-04 DIAGNOSIS — Z872 Personal history of diseases of the skin and subcutaneous tissue: Secondary | ICD-10-CM | POA: Insufficient documentation

## 2013-10-04 DIAGNOSIS — IMO0001 Reserved for inherently not codable concepts without codable children: Secondary | ICD-10-CM | POA: Insufficient documentation

## 2013-10-04 DIAGNOSIS — Z9089 Acquired absence of other organs: Secondary | ICD-10-CM | POA: Insufficient documentation

## 2013-10-04 DIAGNOSIS — Z8709 Personal history of other diseases of the respiratory system: Secondary | ICD-10-CM | POA: Insufficient documentation

## 2013-10-04 DIAGNOSIS — M1990 Unspecified osteoarthritis, unspecified site: Secondary | ICD-10-CM | POA: Insufficient documentation

## 2013-10-04 DIAGNOSIS — R109 Unspecified abdominal pain: Secondary | ICD-10-CM

## 2013-10-04 DIAGNOSIS — Z8619 Personal history of other infectious and parasitic diseases: Secondary | ICD-10-CM | POA: Insufficient documentation

## 2013-10-04 DIAGNOSIS — Z79899 Other long term (current) drug therapy: Secondary | ICD-10-CM | POA: Insufficient documentation

## 2013-10-04 DIAGNOSIS — Z8614 Personal history of Methicillin resistant Staphylococcus aureus infection: Secondary | ICD-10-CM | POA: Insufficient documentation

## 2013-10-04 DIAGNOSIS — Z791 Long term (current) use of non-steroidal anti-inflammatories (NSAID): Secondary | ICD-10-CM | POA: Insufficient documentation

## 2013-10-04 HISTORY — DX: Fibromyalgia: M79.7

## 2013-10-04 LAB — COMPREHENSIVE METABOLIC PANEL
ALT: 60 U/L — ABNORMAL HIGH (ref 0–35)
AST: 132 U/L — ABNORMAL HIGH (ref 0–37)
Albumin: 2.7 g/dL — ABNORMAL LOW (ref 3.5–5.2)
Alkaline Phosphatase: 155 U/L — ABNORMAL HIGH (ref 39–117)
BUN: 8 mg/dL (ref 6–23)
CO2: 30 mEq/L (ref 19–32)
Calcium: 8.6 mg/dL (ref 8.4–10.5)
Chloride: 105 mEq/L (ref 96–112)
Creatinine, Ser: 0.58 mg/dL (ref 0.50–1.10)
GFR calc Af Amer: >90 mL/min (ref >90)
GFR calc non Af Amer: >90 mL/min (ref >90)
Glucose, Bld: 95 mg/dL (ref 70–99)
Potassium: 4.5 mEq/L (ref 3.7–5.3)
Sodium: 140 mEq/L (ref 137–147)
Total Bilirubin: 1.7 mg/dL — ABNORMAL HIGH (ref 0.3–1.2)
Total Protein: 8.2 g/dL (ref 6.0–8.3)

## 2013-10-04 LAB — CBC WITH DIFFERENTIAL/PLATELET
Basophils Absolute: 0 10*3/uL (ref 0.0–0.1)
Basophils Relative: 0 % (ref 0–1)
Eosinophils Absolute: 0.1 10*3/uL (ref 0.0–0.7)
Eosinophils Relative: 1 % (ref 0–5)
HCT: 40.7 % (ref 36.0–46.0)
Hemoglobin: 13.5 g/dL (ref 12.0–15.0)
Lymphocytes Relative: 17 % (ref 12–46)
Lymphs Abs: 1.4 10*3/uL (ref 0.7–4.0)
MCH: 32.1 pg (ref 26.0–34.0)
MCHC: 33.2 g/dL (ref 30.0–36.0)
MCV: 96.9 fL (ref 78.0–100.0)
Monocytes Absolute: 0.9 10*3/uL (ref 0.1–1.0)
Monocytes Relative: 11 % (ref 3–12)
Neutro Abs: 6.1 10*3/uL (ref 1.7–7.7)
Neutrophils Relative %: 71 % (ref 43–77)
Platelets: 94 10*3/uL — ABNORMAL LOW (ref 150–400)
RBC: 4.2 MIL/uL (ref 3.87–5.11)
RDW: 16.4 % — ABNORMAL HIGH (ref 11.5–15.5)
WBC: 8.5 10*3/uL (ref 4.0–10.5)

## 2013-10-04 LAB — LIPASE, BLOOD: Lipase: 44 U/L (ref 11–59)

## 2013-10-04 LAB — AMYLASE: Amylase: 63 U/L (ref 0–105)

## 2013-10-04 MED ORDER — HYDROMORPHONE HCL PF 1 MG/ML IJ SOLN
1.0000 mg | Freq: Once | INTRAMUSCULAR | Status: AC
  Administered 2013-10-04: 1 mg via INTRAMUSCULAR
  Filled 2013-10-04: qty 1

## 2013-10-04 MED ORDER — POTASSIUM CHLORIDE CRYS ER 20 MEQ PO TBCR
20.0000 meq | EXTENDED_RELEASE_TABLET | Freq: Once | ORAL | Status: AC
  Administered 2013-10-04: 20 meq via ORAL
  Filled 2013-10-04: qty 1

## 2013-10-04 MED ORDER — FUROSEMIDE 40 MG PO TABS
40.0000 mg | ORAL_TABLET | Freq: Once | ORAL | Status: AC
  Administered 2013-10-04: 40 mg via ORAL
  Filled 2013-10-04: qty 1

## 2013-10-04 MED ORDER — FUROSEMIDE 20 MG PO TABS: 20.0000 mg | ORAL_TABLET | Freq: Two times a day (BID) | ORAL | Status: DC

## 2013-10-04 MED ORDER — POTASSIUM CHLORIDE 20 MEQ PO PACK: 20.0000 meq | PACK | Freq: Once | ORAL | Status: DC

## 2013-10-04 MED ORDER — ONDANSETRON 4 MG PO TBDP
4.0000 mg | ORAL_TABLET | Freq: Once | ORAL | Status: AC
  Administered 2013-10-04: 4 mg via ORAL
  Filled 2013-10-04: qty 1

## 2013-10-04 NOTE — ED Provider Notes (Signed)
CSN: [id number]     Arrival date & time 10/04/13  1332 History   First MD Initiated Contact with Patient 10/04/13 1543     Chief Complaint  Patient presents with  . Abdominal Pain     (Consider location/radiation/quality/duration/timing/severity/associated sxs/prior Treatment) HPI   58 year old female with abdominal pain. Past history of cirrhosis due to hepatitis C diagnosed 20y ago. She has never undergone treatment for hepatitis C virus. She was diagnosed with cirrhosis earlier this year by CT scan. More recently, she underwent abdominal ultrasound in September 2014 which showed cirrhosis without ascites or suspicious liver lesions. Upper endoscopy was performed on March 17, 2013 which revealed portal hypertensive gastropathy and gastritis. There was no evidence of gastric or esophageal varices. Gastric biopsies did not reveal evidence of Helicobacter pylori. Colonoscopy was performed at that time and revealed extensive diverticulosis and a sessile serrated polyp which was removed.  She did have a very slightly elevated alpha-fetoprotein of 11.8. Subsequent MRI abdomen in December 2014 which showed cirrhotic changes, no concerning lesions otherwise.  Her AST and ALTs have also been chronically elevated. Normal hepatobiliary scan with normal ejection fraction 09/2013. Followed by GI in [facility]. Has been having persistent abdominal pain. Pain diffuse, but worse in upper abdomen. Doesn't particularly localize.   Past Medical History  Diagnosis Date  . Osteoarthritis   . Hep C w/o coma, chronic   . Emphysema   . GERD (gastroesophageal reflux disease)   . IBS (irritable bowel syndrome)   . Psoriasis   . MRSA carrier   . Fibromyalgia    Past Surgical History  Procedure Laterality Date  . Hysteroscopy    . Appendectomy    . Abdominal hysterectomy     Family History  Problem Relation Age of Onset  . Cancer Mother     esophageal  . Cancer Brother    History  Substance Use Topics   . Smoking status: Current Every Day Smoker  . Smokeless tobacco: Not on file  . Alcohol Use: Yes   OB History   Grav Para Term Preterm Abortions TAB SAB Ect Mult Living                 Review of Systems  All systems reviewed and negative, other than as noted in HPI.   Allergies  Hydrocodone-acetaminophen; Meloxicam; and Montelukast  Home Medications   Current Outpatient Rx  Name  Route  Sig  Dispense  Refill  . albuterol (PROVENTIL HFA;VENTOLIN HFA) 108 (90 BASE) MCG/ACT inhaler   Inhalation   Inhale 2 puffs into the lungs every 4 (four) hours as needed for wheezing.   1 Inhaler   0   . Aspirin-Salicylamide-Caffeine (BC FAST PAIN RELIEF) 650-195-33.3 MG PACK   Oral   Take 1 packet by mouth daily as needed. For pain         . cimetidine (TAGAMET) 200 MG tablet   Oral   Take 200 mg by mouth daily as needed. For acid reducer         . esomeprazole (NEXIUM) 40 MG capsule   Oral   Take 40 mg by mouth daily before breakfast.         . hydrochlorothiazide (HYDRODIURIL) 25 MG tablet   Oral   Take 1 tablet (25 mg total) by mouth daily.   30 tablet   2   . ibuprofen (ADVIL,MOTRIN) 800 MG tablet   Oral   Take 1 tablet (800 mg total) by mouth 3 (three) times daily.  21 tablet   0   . meloxicam (MOBIC) 15 MG tablet   Oral   Take 1 tablet (15 mg total) by mouth daily.   30 tablet   3   . omeprazole (PRILOSEC) 20 MG capsule   Oral   Take 1 capsule (20 mg total) by mouth daily.   30 capsule   3   . oxyCODONE-acetaminophen (PERCOCET/ROXICET) 5-325 MG per tablet   Oral   Take 2 tablets by mouth every 4 (four) hours as needed for pain.   15 tablet   0   . pregabalin (LYRICA) 75 MG capsule   Oral   Take 1 capsule (75 mg total) by mouth 2 (two) times daily.   60 capsule   0    BP 148/75  Pulse 109  Temp(Src) 98.1 F (36.7 C) (Oral)  Resp 20  Ht 5\' 2"  (1.575 m)  Wt 174 lb (78.926 kg)  BMI 31.82 kg/m2  SpO2 99% Physical Exam  Nursing note and  vitals reviewed. Constitutional: She appears well-developed and well-nourished. No distress.  HENT:  Head: Normocephalic and atraumatic.  Eyes: Conjunctivae are normal. Right eye exhibits no discharge. Left eye exhibits no discharge.  Neck: Neck supple.  Cardiovascular: Normal rate, regular rhythm and normal heart sounds.  Exam reveals no gallop and no friction rub.   No murmur heard. Pulmonary/Chest: Effort normal and breath sounds normal. No respiratory distress.  Abdominal: Soft. She exhibits distension. There is tenderness.  Mild diffuse tenderness with rebound or guarding. Distended.  Musculoskeletal: She exhibits no edema and no tenderness.  Trace, pitting lower extremity edema  Neurological: She is alert.  Skin: Skin is warm and dry.  spider angiomas  Psychiatric: She has a normal mood and affect. Her behavior is normal. Thought content normal.    ED Course  Procedures (including critical care time) Labs Review Labs Reviewed  CBC WITH DIFFERENTIAL - Abnormal; Notable for the following:    RDW 16.4 (*)    Platelets 94 (*)    All other components within normal limits  COMPREHENSIVE METABOLIC PANEL - Abnormal; Notable for the following:    Albumin 2.7 (*)    AST 132 (*)    ALT 60 (*)    Alkaline Phosphatase 155 (*)    Total Bilirubin 1.7 (*)    All other components within normal limits  AMYLASE  LIPASE, BLOOD   Imaging Review No results found.   EKG Interpretation None      MDM   Final diagnoses:  Abdominal pain  Cirrhosis of liver    [age]-year-old female with abdominal pain and distention. Somewhat chronic in nature. Extensive workup as noted in history of present illness. Workup today fairly unremarkable considering this. Symptoms improved with treatment. We'll give a short course of diuretic for her mild peripheral edema. Outpatient follow back up with her PCP or gastroenterologist. Return precautions were discussed.    Virgel Manifold, MD 10/07/13 [phone]

## 2013-10-04 NOTE — ED Notes (Signed)
RUQ pain for 1 month , seeing Md and has had mult tests for this. N/v, last bm to day  Frequent belching.

## 2013-10-04 NOTE — ED Notes (Addendum)
Pt c/o intermittent RUQ pain x1 month. Pt describes pain and tightness and "twisting". Pt reports normal BMs and urinary symptoms. Pt states she has been seeing GI doctor but "they are unable to find out what's wrong". Pt denies v/d.

## 2013-10-04 NOTE — Discharge Instructions (Signed)
Abdominal Pain, Adult Many things can cause abdominal pain. Usually, abdominal pain is not caused by a disease and will improve without treatment. It can often be observed and treated at home. Your health care provider will do a physical exam and possibly order blood tests and X-rays to help determine the seriousness of your pain. However, in many cases, more time must pass before a clear cause of the pain can be found. Before that point, your health care provider may not know if you need more testing or further treatment. HOME CARE INSTRUCTIONS  Monitor your abdominal pain for any changes. The following actions may help to alleviate any discomfort you are experiencing:  Only take over-the-counter or prescription medicines as directed by your health care provider.  Do not take laxatives unless directed to do so by your health care provider.  Try a clear liquid diet (broth, tea, or water) as directed by your health care provider. Slowly move to a bland diet as tolerated. SEEK MEDICAL CARE IF:  You have unexplained abdominal pain.  You have abdominal pain associated with nausea or diarrhea.  You have pain when you urinate or have a bowel movement.  You experience abdominal pain that wakes you in the night.  You have abdominal pain that is worsened or improved by eating food.  You have abdominal pain that is worsened with eating fatty foods. SEEK IMMEDIATE MEDICAL CARE IF:   Your pain does not go away within 2 hours.  You have a fever.  You keep throwing up (vomiting).  Your pain is felt only in portions of the abdomen, such as the right side or the left lower portion of the abdomen.  You pass bloody or black tarry stools. MAKE SURE YOU:  Understand these instructions.   Will watch your condition.   Will get help right away if you are not doing well or get worse.  Document Released: 04/30/2005 Document Revised: 05/11/2013 Document Reviewed: 03/30/2013 [facility] Patient  Information 2014 [place].  Cirrhosis Cirrhosis is a condition of scarring of the liver which is caused when the liver has tried repairing itself following damage. This damage may come from a previous infection such as one of the forms of hepatitis (usually hepatitis C), or the damage may come from being injured by toxins. The main toxin that causes this damage is alcohol. The scarring of the liver from use of alcohol is irreversible. That means the liver cannot return to normal even though alcohol is not used any more. The main danger of hepatitis C infection is that it may cause long-lasting (chronic) liver disease, and this also may lead to cirrhosis. This complication is progressive and irreversible. CAUSES  Prior to available blood tests, hepatitis C could be contracted by blood transfusions. Since testing of blood has improved, this is now unlikely. This infection can also be contracted through intravenous drug use and the sharing of needles. It can also be contracted through sexual relationships. The injury caused by alcohol comes from too much use. It is not a few drinks that poison the liver, but years of misuse. Usually there will be some signs and symptoms early with scarring of the liver that suggest the development of better habits. Alcohol should never be used while using acetaminophen. A small dose of both taken together may cause irreversible damage to the liver. HOME CARE INSTRUCTIONS  There is no specific treatment for cirrhosis. However, there are things you can do to avoid making the condition worse.  Rest  as needed.  Eat a well-balanced diet. Your caregiver can help you with suggestions.  Vitamin supplements including vitamins A, K, D, and thiamine can help.  A low-salt diet, water restriction, or diuretic medicine may be needed to reduce fluid retention.  Avoid alcohol. This can be extremely toxic if combined with acetaminophen.  Avoid drugs which are toxic to the  liver. Some of these include isoniazid, methyldopa, acetaminophen, anabolic steroids (muscle-building drugs), erythromycin, and oral contraceptives (birth control pills). Check with your caregiver to make sure medicines you are presently taking will not be harmful.  Periodic blood tests may be required. Follow your caregiver's advice regarding the timing of these.  Milk thistle is an herbal remedy which does protect the liver against toxins. However, it will not help once the liver has been scarred. SEEK MEDICAL CARE IF:  You have increasing fatigue or weakness.  You develop swelling of the hands, feet, legs, or face.  You vomit bright red blood, or a coffee ground appearing material.  You have blood in your stools, or the stools turn black and tarry.  You have a fever.  You develop loss of appetite, or have nausea and vomiting.  You develop jaundice.  You develop easy bruising or bleeding.  You have worsening of any of the problems you are concerned about. Document Released: 07/21/2005 Document Revised: 10/13/2011 Document Reviewed: 03/08/2008 [facility] Patient Information 2014 [place].

## 2013-10-14 ENCOUNTER — Telehealth: Payer: Self-pay | Admitting: Family Medicine

## 2013-10-14 NOTE — Telephone Encounter (Signed)
appt given for in the am  

## 2013-10-15 ENCOUNTER — Ambulatory Visit (INDEPENDENT_AMBULATORY_CARE_PROVIDER_SITE_OTHER): Payer: Medicaid Other | Admitting: Nurse Practitioner

## 2013-10-15 VITALS — BP 178/94 | HR 111 | Temp 97.6°F | Ht 62.0 in | Wt 177.6 lb

## 2013-10-15 DIAGNOSIS — J449 Chronic obstructive pulmonary disease, unspecified: Secondary | ICD-10-CM

## 2013-10-15 DIAGNOSIS — F1020 Alcohol dependence, uncomplicated: Secondary | ICD-10-CM

## 2013-10-15 DIAGNOSIS — K7460 Unspecified cirrhosis of liver: Secondary | ICD-10-CM

## 2013-10-15 DIAGNOSIS — IMO0001 Reserved for inherently not codable concepts without codable children: Secondary | ICD-10-CM

## 2013-10-15 DIAGNOSIS — I10 Essential (primary) hypertension: Secondary | ICD-10-CM

## 2013-10-15 DIAGNOSIS — K219 Gastro-esophageal reflux disease without esophagitis: Secondary | ICD-10-CM

## 2013-10-15 MED ORDER — METHYLPREDNISOLONE ACETATE 80 MG/ML IJ SUSP
80.0000 mg | Freq: Once | INTRAMUSCULAR | Status: AC
  Administered 2013-10-15: 80 mg via INTRAMUSCULAR

## 2013-10-15 MED ORDER — OMEPRAZOLE 40 MG PO CPDR: 40.0000 mg | DELAYED_RELEASE_CAPSULE | Freq: Every day | ORAL | Status: DC

## 2013-10-15 MED ORDER — FUROSEMIDE 40 MG PO TABS: 40.0000 mg | ORAL_TABLET | Freq: Every day | ORAL | Status: DC

## 2013-10-15 MED ORDER — ALBUTEROL SULFATE HFA 108 (90 BASE) MCG/ACT IN AERS: 2.0000 | INHALATION_SPRAY | RESPIRATORY_TRACT | Status: DC | PRN

## 2013-10-15 MED ORDER — PREDNISONE 20 MG PO TABS: ORAL_TABLET | ORAL | Status: DC

## 2013-10-15 MED ORDER — BUDESONIDE-FORMOTEROL FUMARATE 160-4.5 MCG/ACT IN AERO: 2.0000 | INHALATION_SPRAY | Freq: Two times a day (BID) | RESPIRATORY_TRACT | Status: DC

## 2013-10-15 MED ORDER — LISINOPRIL 40 MG PO TABS: 40.0000 mg | ORAL_TABLET | Freq: Every day | ORAL | Status: DC

## 2013-10-15 NOTE — Patient Instructions (Signed)
Cirrhosis  Cirrhosis is a condition of scarring of the liver which is caused when the liver has tried repairing itself following damage. This damage may come from a previous infection such as one of the forms of hepatitis (usually hepatitis C), or the damage may come from being injured by toxins. The main toxin that causes this damage is alcohol. The scarring of the liver from use of alcohol is irreversible. That means the liver cannot return to normal even though alcohol is not used any more. The main danger of hepatitis C infection is that it may cause long-lasting (chronic) liver disease, and this also may lead to cirrhosis. This complication is progressive and irreversible.  CAUSES   Prior to available blood tests, hepatitis C could be contracted by blood transfusions. Since testing of blood has improved, this is now unlikely. This infection can also be contracted through intravenous drug use and the sharing of needles. It can also be contracted through sexual relationships. The injury caused by alcohol comes from too much use. It is not a few drinks that poison the liver, but years of misuse. Usually there will be some signs and symptoms early with scarring of the liver that suggest the development of better habits. Alcohol should never be used while using acetaminophen. A small dose of both taken together may cause irreversible damage to the liver.  HOME CARE INSTRUCTIONS   There is no specific treatment for cirrhosis. However, there are things you can do to avoid making the condition worse.  · Rest as needed.  · Eat a well-balanced diet. Your caregiver can help you with suggestions.  · Vitamin supplements including vitamins A, K, D, and thiamine can help.  · A low-salt diet, water restriction, or diuretic medicine may be needed to reduce fluid retention.  · Avoid alcohol. This can be extremely toxic if combined with acetaminophen.  · Avoid drugs which are toxic to the liver. Some of these include isoniazid,  methyldopa, acetaminophen, anabolic steroids (muscle-building drugs), erythromycin, and oral contraceptives (birth control pills). Check with your caregiver to make sure medicines you are presently taking will not be harmful.  · Periodic blood tests may be required. Follow your caregiver's advice regarding the timing of these.  · Milk thistle is an herbal remedy which does protect the liver against toxins. However, it will not help once the liver has been scarred.  SEEK MEDICAL CARE IF:  · You have increasing fatigue or weakness.  · You develop swelling of the hands, feet, legs, or face.  · You vomit bright red blood, or a coffee ground appearing material.  · You have blood in your stools, or the stools turn black and tarry.  · You have a fever.  · You develop loss of appetite, or have nausea and vomiting.  · You develop jaundice.  · You develop easy bruising or bleeding.  · You have worsening of any of the problems you are concerned about.  Document Released: 07/21/2005 Document Revised: 10/13/2011 Document Reviewed: 03/08/2008  ExitCare® Patient Information ©2014 [employer].

## 2013-10-15 NOTE — Addendum Note (Signed)
Addended by: Chevis Pretty on: 10/15/2013 08:53 AM   Modules accepted: Orders

## 2013-10-15 NOTE — Progress Notes (Signed)
   Subjective:    Patient ID: Kristen Marsh, female    DOB: Jul 04, 1956, 58 y.o.   MRN: [id number]  HPI  Patient brought in by Niece- Started having SOB last night- has been coughing for awhile and actually lost her voice last week. SHe has a long history of COPD and only inhaler she has is albuterol. She is gaining weight for chirrosis of the liver and still continues to drink alcohol- consumes 2-4 24oz of beer a day.    Review of Systems  Constitutional: Positive for appetite change. Negative for fever and chills.  HENT: Positive for sore throat and voice change. Negative for congestion and rhinorrhea.   Respiratory: Positive for cough, shortness of breath and wheezing.   Cardiovascular: Negative.   Gastrointestinal: Negative.   Genitourinary: Negative.   All other systems reviewed and are negative.       Objective:   Physical Exam  Constitutional: She is oriented to person, place, and time. She appears well-developed and well-nourished.  HENT:  Right Ear: External ear normal.  Left Ear: External ear normal.  Nose: Nose normal.  Mouth/Throat: Oropharynx is clear and moist.  Eyes: Pupils are equal, round, and reactive to light.  Neck: Normal range of motion. Neck supple.  Cardiovascular: Regular rhythm and normal heart sounds.   tachycardia  Pulmonary/Chest: She has no wheezes (with dininished breath sounds bil lower lobes.).  Abdominal: Soft. Bowel sounds are normal. There is tenderness (diffuse).  Ascites noted  Neurological: She is alert and oriented to person, place, and time.  Skin: Skin is warm and dry.  Psychiatric: She has a normal mood and affect. Her behavior is normal. Judgment and thought content normal.   BP 178/94  Pulse 111  Temp(Src) 97.6 F (36.4 C) (Oral)  Ht 5\' 2"  (1.575 m)  Wt 177 lb 9.6 oz (80.559 kg)  BMI 32.48 kg/m2  SpO2 95%        Assessment & Plan:  1. COPD bronchitis Albuterol HFA as needed - albuterol (PROVENTIL HFA;VENTOLIN HFA) 108  (90 BASE) MCG/ACT inhaler; Inhale 2 puffs into the lungs every 4 (four) hours as needed for wheezing.  Dispense: 1 Inhaler; Refill: 0 - budesonide-formoterol (SYMBICORT) 160-4.5 MCG/ACT inhaler; Inhale 2 puffs into the lungs 2 (two) times daily.  Dispense: 6 Inhaler; Refill: 3 - methylPREDNISolone acetate (DEPO-MEDROL) injection 80 mg; Inject 1 mL (80 mg total) into the muscle once.  2. Cirrhosis of liver Keep follow up appointments with GI doctor Daily weights - furosemide (LASIX) 40 MG tablet; Take 1 tablet (40 mg total) by mouth daily.  Dispense: 30 tablet; Refill: 3  3. Alcoholism /alcohol abuse STOP DRINKING  4. Hypertension Avoid NA+ in diet - lisinopril (PRINIVIL,ZESTRIL) 40 MG tablet; Take 1 tablet (40 mg total) by mouth daily.  Dispense: 30 tablet; Refill: 3  5. GERD (gastroesophageal reflux disease) Avoid spicy and fatty foods - omeprazole (PRILOSEC) 40 MG capsule; Take 1 capsule (40 mg total) by mouth daily.  Dispense: 30 capsule; Refill: 3  Follow up [place], FNP

## 2013-10-17 ENCOUNTER — Telehealth: Payer: Self-pay | Admitting: Nurse Practitioner

## 2013-10-17 ENCOUNTER — Ambulatory Visit: Payer: Medicaid Other | Admitting: Nurse Practitioner

## 2013-10-17 NOTE — Telephone Encounter (Signed)
appt for wed

## 2013-10-19 ENCOUNTER — Ambulatory Visit (INDEPENDENT_AMBULATORY_CARE_PROVIDER_SITE_OTHER): Payer: Medicaid Other | Admitting: Nurse Practitioner

## 2013-10-19 ENCOUNTER — Encounter: Payer: Self-pay | Admitting: Nurse Practitioner

## 2013-10-19 VITALS — BP 104/63 | HR 98 | Temp 97.2°F | Ht <= 58 in | Wt 169.4 lb

## 2013-10-19 DIAGNOSIS — F1020 Alcohol dependence, uncomplicated: Secondary | ICD-10-CM

## 2013-10-19 DIAGNOSIS — J040 Acute laryngitis: Secondary | ICD-10-CM

## 2013-10-19 DIAGNOSIS — I10 Essential (primary) hypertension: Secondary | ICD-10-CM | POA: Insufficient documentation

## 2013-10-19 DIAGNOSIS — IMO0001 Reserved for inherently not codable concepts without codable children: Secondary | ICD-10-CM

## 2013-10-19 DIAGNOSIS — J449 Chronic obstructive pulmonary disease, unspecified: Secondary | ICD-10-CM

## 2013-10-19 DIAGNOSIS — K7460 Unspecified cirrhosis of liver: Secondary | ICD-10-CM

## 2013-10-19 MED ORDER — HYDROXYZINE PAMOATE 25 MG PO CAPS: 25.0000 mg | ORAL_CAPSULE | Freq: Three times a day (TID) | ORAL | Status: DC | PRN

## 2013-10-19 NOTE — Progress Notes (Signed)
   Subjective:    Patient ID: Kristen Marsh, female    DOB: 1956-06-01, 58 y.o.   MRN: [id number]  HPI  Patient brought in by niece again today for follow up- She was seen 10/15/13 with: - COPD exacerbation- was given symbicort inhaler to be used BID and a steroid shot- she is still hoarce today - her breathing is much better- slight cough- hasn't need albuterol but has been doing breathing treatment. -Hypertension- was put on lisinopril 40 mg and blood pressure 656 Systolic today. No side effects. - alchololism- patient says that she has only had 2 small beers since last seen -cirrhosis of liver with ascites- was given lasix to be taken daily- patient has lost 4 lbs since her visit and says that her belly isn't as tight.    Review of Systems  HENT: Positive for voice change.   Respiratory: Negative.   Cardiovascular: Negative.   Genitourinary: Negative.   Musculoskeletal: Negative.   Neurological: Negative.   Psychiatric/Behavioral: Negative.   All other systems reviewed and are negative.       Objective:   Physical Exam  Constitutional: She is oriented to person, place, and time. She appears well-developed and well-nourished.  HENT:  Right Ear: External ear normal.  Left Ear: External ear normal.  Nose: Nose normal.  Mouth/Throat: Oropharynx is clear and moist.  Voice is a whisper  Eyes: Pupils are equal, round, and reactive to light.  Neck: Normal range of motion. Neck supple.  Cardiovascular: Normal rate, regular rhythm and normal heart sounds.   Pulmonary/Chest: Effort normal and breath sounds normal.  Musculoskeletal: Normal range of motion.  Neurological: She is alert and oriented to person, place, and time.  Skin: Skin is warm.  Psychiatric: She has a normal mood and affect. Her behavior is normal. Judgment and thought content normal.    BP 104/63  Pulse 98  Temp(Src) 97.2 F (36.2 C) (Oral)  Ht 2' (0.61 m)  Wt 169 lb 6.4 oz (76.839 kg)  BMI 206.50 kg/m2  PF  62 L/min       Assessment & Plan:  1. COPD bronchitis Continue symbicort as rx Nebulizer as needed  2. Cirrhosis of liver Continue lasix as rx Avoid alcohol  3. Alcoholism /alcohol abuse Meds ordered this encounter  Medications  . nortriptyline (PAMELOR) 25 MG capsule    Sig: Take 25 mg by mouth at bedtime.  . hydrOXYzine (VISTARIL) 25 MG capsule    Sig: Take 1 capsule (25 mg total) by mouth 3 (three) times daily as needed.    Dispense:  90 capsule    Refill:  0    Order Specific Question:  Supervising Provider    Answer:  Chipper Herb [[id number]]  vistaril will help with shakes Suggested anabue but patient refuses for now  4. Hypertension Continue lisinpril as ordered Low NA diet  5. Laryngitis Limit talking - Ambulatory referral to ENT     STOP SMOKING Orders Placed This Encounter  Procedures  . CMP14+EGFR  . NMR, lipoprofile  . Ambulatory referral to ENT    Referral Priority:  Routine    Referral Type:  Consultation    Referral Reason:  Specialty Services Required    Requested Specialty:  Otolaryngology    Number of Visits Requested:  [place], [place]

## 2013-10-19 NOTE — Patient Instructions (Signed)

## 2013-10-21 LAB — CMP14+EGFR
ALK PHOS: 143 IU/L — AB (ref 39–117)
ALT: 62 IU/L — AB (ref 0–32)
AST: 113 IU/L — ABNORMAL HIGH (ref 0–40)
Albumin/Globulin Ratio: 0.7 — ABNORMAL LOW (ref 1.1–2.5)
Albumin: 3.3 g/dL — ABNORMAL LOW (ref 3.5–5.5)
BILIRUBIN TOTAL: 1.3 mg/dL — AB (ref 0.0–1.2)
BUN / CREAT RATIO: 16 (ref 9–23)
BUN: 12 mg/dL (ref 6–24)
CHLORIDE: 100 mmol/L (ref 97–108)
CO2: 22 mmol/L (ref 18–29)
Calcium: 8.1 mg/dL — ABNORMAL LOW (ref 8.7–10.2)
Creatinine, Ser: 0.75 mg/dL (ref 0.57–1.00)
GFR calc non Af Amer: 89 mL/min/{1.73_m2} (ref >59)
GFR, EST AFRICAN AMERICAN: 103 mL/min/{1.73_m2} (ref >59)
Globulin, Total: 4.8 g/dL — ABNORMAL HIGH (ref 1.5–4.5)
Glucose: 104 mg/dL — ABNORMAL HIGH (ref 65–99)
Potassium: 3.4 mmol/L — ABNORMAL LOW (ref 3.5–5.2)
Sodium: 138 mmol/L (ref 134–144)
Total Protein: 8.1 g/dL (ref 6.0–8.5)

## 2013-10-21 LAB — NMR, LIPOPROFILE
Cholesterol: 122 mg/dL (ref <200)
HDL Cholesterol by NMR: 60 mg/dL (ref >=40)
HDL Particle Number: 13.7 umol/L — ABNORMAL LOW (ref >=30.5)
LDLC SERPL CALC-MCNC: 53 mg/dL (ref <100)
LP-IR SCORE: 29 (ref <=45)
Small LDL Particle Number: <90 nmol/L (ref <=527)
Triglycerides by NMR: 44 mg/dL (ref <150)

## 2013-11-21 ENCOUNTER — Ambulatory Visit: Payer: Medicaid Other | Admitting: Nurse Practitioner

## 2013-11-29 ENCOUNTER — Telehealth: Payer: Self-pay | Admitting: General Practice

## 2013-11-29 NOTE — Telephone Encounter (Signed)
She does not know what medicine it is that she needs and will call back if needed.

## 2013-11-30 ENCOUNTER — Telehealth: Payer: Self-pay | Admitting: Nurse Practitioner

## 2013-11-30 NOTE — Telephone Encounter (Signed)
Patient aware.

## 2013-11-30 NOTE — Telephone Encounter (Signed)
Can only get pain meds from one doctor will have to wait until they get back to [place].

## 2013-12-07 ENCOUNTER — Telehealth: Payer: Self-pay | Admitting: Nurse Practitioner

## 2013-12-07 NOTE — Telephone Encounter (Signed)
appt given for tomorrow

## 2013-12-08 ENCOUNTER — Ambulatory Visit (INDEPENDENT_AMBULATORY_CARE_PROVIDER_SITE_OTHER): Payer: Medicaid Other | Admitting: Nurse Practitioner

## 2013-12-08 ENCOUNTER — Encounter: Payer: Self-pay | Admitting: Nurse Practitioner

## 2013-12-08 VITALS — BP 93/54 | HR 105 | Temp 98.6°F | Ht 62.0 in | Wt 171.2 lb

## 2013-12-08 DIAGNOSIS — B370 Candidal stomatitis: Secondary | ICD-10-CM

## 2013-12-08 MED ORDER — NYSTATIN 100000 UNIT/ML MT SUSP: 5.0000 mL | Freq: Four times a day (QID) | OROMUCOSAL | Status: DC

## 2013-12-08 NOTE — Progress Notes (Addendum)
   Subjective:    Patient ID: Kristen Marsh, female    DOB: 01-Nov-1955, 58 y.o.   MRN: [id number]  HPI Patient in c/o sore throat- went to er and was given antibioitc and prednisone- Her strep was negativeand her mono was negative- She has an appointment with ENT Monday- She has seen ENT in the past for the samething and they said if it happened again that they would need to do bx. White film in the back of throat.    Review of Systems  Constitutional: Negative.   HENT: Positive for sore throat and voice change.   Respiratory: Negative.   Cardiovascular: Negative.   Psychiatric/Behavioral: Negative.   All other systems reviewed and are negative.      Objective:   Physical Exam  Constitutional: She is oriented to person, place, and time. She appears well-developed and well-nourished.  HENT:  Right Ear: Hearing, tympanic membrane, external ear and ear canal normal.  Left Ear: Hearing, tympanic membrane, external ear and ear canal normal.  Nose: Mucosal edema present.  Mouth/Throat: Oropharynx is clear and moist and mucous membranes are normal.  White film all over the back of her throat. Voice hoarce  Cardiovascular: Normal rate, regular rhythm and normal heart sounds.   Pulmonary/Chest: Effort normal and breath sounds normal.  Neurological: She is alert and oriented to person, place, and time.  Skin: Skin is warm and dry.  Psychiatric: She has a normal mood and affect. Her behavior is normal. Judgment and thought content normal.   BP 93/54  Pulse 105  Temp(Src) 98.6 F (37 C) (Oral)  Ht 5\' 2"  (1.575 m)  Wt 171 lb 3.2 oz (77.656 kg)  BMI 31.31 kg/m2        Assessment & Plan:   1. Oral thrush    Meds ordered this encounter  Medications  . DISCONTD: Hydrocodone-Acetaminophen 5-300 MG TABS    Sig: Take 5-300 mg by mouth.  . furosemide (LASIX) 40 MG tablet    Sig: Take 40 mg by mouth.  . nystatin (MYCOSTATIN) 100000 UNIT/ML suspension    Sig: Take 5 mLs (500,000  Units total) by mouth 4 (four) times daily. Swish and swallow    Dispense:  60 mL    Refill:  0    Order Specific Question:  Supervising Provider    Answer:  Chipper Herb [[id number]]   Eat yogart RTO prn  Mary-Margaret Hassell Done, FNP

## 2013-12-08 NOTE — Patient Instructions (Signed)
Thrush, Adult  Thrush, also called oral candidiasis, is a fungal infection that develops in the mouth and throat and on the tongue. It causes white patches to form on the mouth and tongue. Thrush is most common in older adults, but it can occur at any age.  Many cases of thrush are mild, but this infection can also be more serious. Thrush can be a recurring problem for people who have chronic illnesses or who take medicines that limit the body's ability to fight infection. Because these people have difficulty fighting infections, the fungus that causes thrush can spread throughout the body. This can cause life-threatening blood or organ infections. CAUSES  Thrush is usually caused by a yeast called Candida albicans. This fungus is normally present in small amounts in the mouth and on other mucous membranes. It usually causes no harm. However, when conditions are present that allow the fungus to grow uncontrolled, it invades surrounding tissues and becomes an infection. Less often, other Candida species can also lead to thrush.  RISK FACTORS Thrush is more likely to develop in the following people:  People with an impaired ability to fight infection (weakened immune system).   Older adults.   People with HIV.   People with diabetes.   People with dry mouth (xerostomia).   Pregnant women.   People with poor dental care, especially those who have false teeth.   People who use antibiotic medicines.  SIGNS AND SYMPTOMS  Thrush can be a mild infection that causes no symptoms. If symptoms develop, they may include:   A burning feeling in the mouth and throat. This can occur at the start of a thrush infection.   White patches that adhere to the mouth and tongue. The tissue around the patches may be red, raw, and painful. If rubbed (during tooth brushing, for example), the patches and the tissue of the mouth may bleed easily.   A bad taste in the mouth or difficulty tasting foods.    Cottony feeling in the mouth.   Pain during eating and swallowing. DIAGNOSIS  Your health care provider can usually diagnose thrush by looking in your mouth and asking you questions about your health.  TREATMENT  Medicines that help prevent the growth of fungi (antifungals) are the standard treatment for thrush. These medicines are either applied directly to the affected area (topical) or swallowed (oral). The treatment will depend on the severity of the condition.  Mild Thrush Mild cases of thrush may clear up with the use of an antifungal mouth rinse or lozenges. Treatment usually lasts about 14 days.  Moderate to Severe Thrush  More severe thrush infections that have spread to the esophagus are treated with an oral antifungal medicine. A topical antifungal medicine may also be used.   For some severe infections, a treatment period longer than 14 days may be needed.   Oral antifungal medicines are almost never used during pregnancy because the fetus may be harmed. However, if a pregnant woman has a rare, severe thrush infection that has spread to her blood, oral antifungal medicines may be used. In this case, the risk of harm to the mother and fetus from the severe thrush infection may be greater than the risk posed by the use of antifungal medicines.  Persistent or Recurrent Thrush For cases of thrush that do not go away or keep coming back, treatment may involve the following:   Treatment may be needed twice as long as the symptoms last.   Treatment will   include both oral and topical antifungal medicines.   People with weakened immune systems can take an antifungal medicine on a continuous basis to prevent thrush infections.  It is important to treat conditions that make you more likely to get thrush, such as diabetes or HIV.  HOME CARE INSTRUCTIONS   Only take over-the-counter or prescription medicine as directed by your health care provider. Talk to your health care  provider about an over-the-counter medicine called gentian violet, which kills bacteria and fungi.   Eat plain, unflavored yogurt as directed by your health care provider. Check the label to make sure the yogurt contains live cultures. This yogurt can help healthy bacteria grow in the mouth that can stop the growth of the fungus that causes thrush.   Try these measures to help reduce the discomfort of thrush:   Drink cold liquids such as water or iced tea.   Try flavored ice treats or frozen juices.   Eat foods that are easy to swallow, such as gelatin, ice cream, or custard.   If the patches in your mouth are painful, try drinking from a straw.   Rinse your mouth several times a day with a warm saltwater rinse. You can make the saltwater mixture with 1 tsp (6 g) of salt in 8 fl oz (0.2 L) of warm water.   If you wear dentures, remove the dentures before going to bed, brush them vigorously, and soak them in a cleaning solution as directed by your health care provider.   Women who are breastfeeding should clean their nipples with an antifungal medicine as directed by their health care provider. Dry the nipples after breastfeeding. Applying lanolin-containing body lotion may help relieve nipple soreness.  SEEK MEDICAL CARE IF:  Your symptoms are getting worse or are not improving within 7 days of starting treatment.   You have symptoms of spreading infection, such as white patches on the skin outside of the mouth.   You are nursing and you have redness, burning, or pain in the nipples that is not relieved with treatment.  MAKE SURE YOU:  Understand these instructions.  Will watch your condition.  Will get help right away if you are not doing well or get worse. Document Released: 04/15/2004 Document Revised: 05/11/2013 Document Reviewed: 02/21/2013 ExitCare Patient Information 2014 [employer].  

## 2014-01-19 ENCOUNTER — Telehealth: Payer: Self-pay | Admitting: Nurse Practitioner

## 2014-01-19 ENCOUNTER — Other Ambulatory Visit: Payer: Self-pay | Admitting: Nurse Practitioner

## 2014-01-19 MED ORDER — FUROSEMIDE 40 MG PO TABS: 40.0000 mg | ORAL_TABLET | Freq: Every day | ORAL | Status: DC

## 2014-01-19 NOTE — Telephone Encounter (Signed)
done

## 2014-03-09 ENCOUNTER — Ambulatory Visit (INDEPENDENT_AMBULATORY_CARE_PROVIDER_SITE_OTHER): Payer: Medicaid Other | Admitting: Family

## 2014-03-09 ENCOUNTER — Ambulatory Visit (INDEPENDENT_AMBULATORY_CARE_PROVIDER_SITE_OTHER): Payer: Medicaid Other

## 2014-03-09 ENCOUNTER — Encounter: Payer: Self-pay | Admitting: Family

## 2014-03-09 ENCOUNTER — Telehealth: Payer: Self-pay | Admitting: *Deleted

## 2014-03-09 VITALS — BP 129/82 | HR 93 | Temp 98.8°F | Ht 62.0 in | Wt 157.2 lb

## 2014-03-09 DIAGNOSIS — K5900 Constipation, unspecified: Secondary | ICD-10-CM

## 2014-03-09 DIAGNOSIS — N390 Urinary tract infection, site not specified: Secondary | ICD-10-CM

## 2014-03-09 DIAGNOSIS — R109 Unspecified abdominal pain: Secondary | ICD-10-CM

## 2014-03-09 LAB — POCT UA - MICROSCOPIC ONLY
Casts, Ur, LPF, POC: NEGATIVE
Crystals, Ur, HPF, POC: NEGATIVE
Mucus, UA: NEGATIVE
Yeast, UA: NEGATIVE

## 2014-03-09 LAB — POCT URINALYSIS DIPSTICK
BILIRUBIN UA: NEGATIVE
Blood, UA: NEGATIVE
GLUCOSE UA: NEGATIVE
KETONES UA: NEGATIVE
Leukocytes, UA: NEGATIVE
Nitrite, UA: NEGATIVE
Protein, UA: NEGATIVE
SPEC GRAV UA: 1.01
Urobilinogen, UA: NEGATIVE
pH, UA: 6.5

## 2014-03-09 MED ORDER — NALOXEGOL OXALATE 25 MG PO TABS: 25.0000 mg | ORAL_TABLET | ORAL | Status: DC

## 2014-03-09 MED ORDER — CIPROFLOXACIN HCL 500 MG PO TABS: 500.0000 mg | ORAL_TABLET | Freq: Two times a day (BID) | ORAL | Status: DC

## 2014-03-09 NOTE — Patient Instructions (Signed)
Urinary Tract Infection  Urinary tract infections (UTIs) can develop anywhere along your urinary tract. Your urinary tract is your body's drainage system for removing wastes and extra water. Your urinary tract includes two kidneys, two ureters, a bladder, and a urethra. Your kidneys are a pair of bean-shaped organs. Each kidney is about the size of your fist. They are located below your ribs, one on each side of your spine.  CAUSES  Infections are caused by microbes, which are microscopic organisms, including fungi, viruses, and bacteria. These organisms are so small that they can only be seen through a microscope. Bacteria are the microbes that most commonly cause UTIs.  SYMPTOMS   Symptoms of UTIs may vary by age and gender of the patient and by the location of the infection. Symptoms in young women typically include a frequent and intense urge to urinate and a painful, burning feeling in the bladder or urethra during urination. Older women and men are more likely to be tired, shaky, and weak and have muscle aches and abdominal pain. A fever may mean the infection is in your kidneys. Other symptoms of a kidney infection include pain in your back or sides below the ribs, nausea, and vomiting.  DIAGNOSIS  To diagnose a UTI, your caregiver will ask you about your symptoms. Your caregiver also will ask to provide a urine sample. The urine sample will be tested for bacteria and white blood cells. White blood cells are made by your body to help fight infection.  TREATMENT   Typically, UTIs can be treated with medication. Because most UTIs are caused by a bacterial infection, they usually can be treated with the use of antibiotics. The choice of antibiotic and length of treatment depend on your symptoms and the type of bacteria causing your infection.  HOME CARE INSTRUCTIONS  · If you were prescribed antibiotics, take them exactly as your caregiver instructs you. Finish the medication even if you feel better after  you have only taken some of the medication.  · Drink enough water and fluids to keep your urine clear or pale yellow.  · Avoid caffeine, tea, and carbonated beverages. They tend to irritate your bladder.  · Empty your bladder often. Avoid holding urine for long periods of time.  · Empty your bladder before and after sexual intercourse.  · After a bowel movement, women should cleanse from front to back. Use each tissue only once.  SEEK MEDICAL CARE IF:   · You have back pain.  · You develop a fever.  · Your symptoms do not begin to resolve within 3 days.  SEEK IMMEDIATE MEDICAL CARE IF:   · You have severe back pain or lower abdominal pain.  · You develop chills.  · You have nausea or vomiting.  · You have continued burning or discomfort with urination.  MAKE SURE YOU:   · Understand these instructions.  · Will watch your condition.  · Will get help right away if you are not doing well or get worse.  Document Released: 04/30/2005 Document Revised: 01/20/2012 Document Reviewed: 08/29/2011  ExitCare® Patient Information ©2015 [employer]. This information is not intended to replace advice given to you by your health care provider. Make sure you discuss any questions you have with your health care provider.  Constipation  Constipation is when a person has fewer than three bowel movements a week, has difficulty having a bowel movement, or has stools that are dry, hard, or larger than normal. As people grow   older, constipation is more common. If you try to fix constipation with medicines that make you have a bowel movement (laxatives), the problem may get worse. Long-term laxative use may cause the muscles of the colon to become weak. A low-fiber diet, not taking in enough fluids, and taking certain medicines may make constipation worse.   CAUSES   · Certain medicines, such as antidepressants, pain medicine, iron supplements, antacids, and water pills.    · Certain diseases, such as diabetes, irritable bowel syndrome  (IBS), thyroid disease, or depression.    · Not drinking enough water.    · Not eating enough fiber-rich foods.    · Stress or travel.    · Lack of physical activity or exercise.    · Ignoring the urge to have a bowel movement.    · Using laxatives too much.    SIGNS AND SYMPTOMS   · Having fewer than three bowel movements a week.    · Straining to have a bowel movement.    · Having stools that are hard, dry, or larger than normal.    · Feeling full or bloated.    · Pain in the lower abdomen.    · Not feeling relief after having a bowel movement.    DIAGNOSIS   Your health care provider will take a medical history and perform a physical exam. Further testing may be done for severe constipation. Some tests may include:  · A barium enema X-ray to examine your rectum, colon, and, sometimes, your small intestine.    · A sigmoidoscopy to examine your lower colon.    · A colonoscopy to examine your entire colon.  TREATMENT   Treatment will depend on the severity of your constipation and what is causing it. Some dietary treatments include drinking more fluids and eating more fiber-rich foods. Lifestyle treatments may include regular exercise. If these diet and lifestyle recommendations do not help, your health care provider may recommend taking over-the-counter laxative medicines to help you have bowel movements. Prescription medicines may be prescribed if over-the-counter medicines do not work.   HOME CARE INSTRUCTIONS   · Eat foods that have a lot of fiber, such as fruits, vegetables, whole grains, and beans.  · Limit foods high in fat and processed sugars, such as french fries, hamburgers, cookies, candies, and soda.    · A fiber supplement may be added to your diet if you cannot get enough fiber from foods.    · Drink enough fluids to keep your urine clear or pale yellow.    · Exercise regularly or as directed by your health care provider.    · Go to the restroom when you have the urge to go. Do not hold it.    · Only  take over-the-counter or prescription medicines as directed by your health care provider. Do not take other medicines for constipation without talking to your health care provider first.    SEEK IMMEDIATE MEDICAL CARE IF:   · You have bright red blood in your stool.    · Your constipation lasts for more than 4 days or gets worse.    · You have abdominal or rectal pain.    · You have thin, pencil-like stools.    · You have unexplained weight loss.  MAKE SURE YOU:   · Understand these instructions.  · Will watch your condition.  · Will get help right away if you are not doing well or get worse.  Document Released: 04/18/2004 Document Revised: 07/26/2013 Document Reviewed: 05/02/2013  ExitCare® Patient Information ©2015 [employer]. This information is not intended to replace advice given to you by

## 2014-03-09 NOTE — Progress Notes (Signed)
Subjective:    Patient ID: Kristen Marsh, female    DOB: 21-Jun-1956, 58 y.o.   MRN: [id number]  Abdominal Pain This is a new problem. The current episode started in the past 7 days (Saturday). The onset quality is sudden. The problem occurs constantly. The problem has been gradually worsening. The pain is located in the suprapubic region. The pain is at a severity of 10/10. The pain is moderate. The quality of the pain is sharp. The abdominal pain radiates to the RLQ and LLQ. Associated symptoms include dysuria, frequency, hematuria and nausea. Pertinent negatives include no fever, headaches or vomiting. The pain is aggravated by certain positions, movement and urination. The pain is relieved by being still. She has tried acetaminophen and oral narcotic analgesics for the symptoms. The treatment provided moderate relief.      Review of Systems  Constitutional: Negative.  Negative for fever.  HENT: Negative.   Eyes: Negative.   Respiratory: Negative.  Negative for shortness of breath.   Cardiovascular: Negative.  Negative for palpitations.  Gastrointestinal: Positive for nausea and abdominal pain. Negative for vomiting.  Endocrine: Negative.   Genitourinary: Positive for dysuria, frequency and hematuria.  Musculoskeletal: Negative.   Neurological: Negative.  Negative for headaches.  Hematological: Negative.   Psychiatric/Behavioral: Negative.   All other systems reviewed and are negative.      Objective:   Physical Exam  Vitals reviewed. Constitutional: She is oriented to person, place, and time. She appears well-developed and well-nourished. No distress.  Cardiovascular: Normal rate, regular rhythm, normal heart sounds and intact distal pulses.   No murmur heard. Pulmonary/Chest: Effort normal and breath sounds normal. No respiratory distress. She has no wheezes.  Abdominal: Soft. Bowel sounds are normal. She exhibits no distension. There is no tenderness.  Musculoskeletal:  Normal range of motion. She exhibits no edema and no tenderness.  Neurological: She is alert and oriented to person, place, and time. She has normal reflexes. No cranial nerve deficit.  Skin: Skin is warm and dry.  Psychiatric: She has a normal mood and affect. Her behavior is normal. Judgment and thought content normal.   Results for orders placed in visit on 03/09/14  POCT UA - MICROSCOPIC ONLY      Result Value Ref Range   WBC, Ur, HPF, POC 15-20     RBC, urine, microscopic 5-7     Bacteria, U Microscopic occ     Mucus, UA neg     Epithelial cells, urine per micros mod     Crystals, Ur, HPF, POC neg     Casts, Ur, LPF, POC neg     Yeast, UA neg    POCT URINALYSIS DIPSTICK      Result Value Ref Range   Color, UA yellow     Clarity, UA clear     Glucose, UA neg     Bilirubin, UA neg     Ketones, UA neg     Spec Grav, UA 1.010     Blood, UA neg     pH, UA 6.5     Protein, UA neg     Urobilinogen, UA negative     Nitrite, UA neg     Leukocytes, UA Negative     KUB-Colon full of stool- Preliminary reading by Evelina Dun, FNP [facility]    BP 129/82  Pulse 93  Temp(Src) 98.8 F (37.1 C) (Oral)  Ht 5\' 2"  (1.575 m)  Wt 157 lb 3.2 oz (71.305 kg)  BMI 28.74  kg/m2     Assessment & Plan:  1. Abdominal pain, unspecified site - DG Abd 1 View; Future - POCT UA - Microscopic Only - POCT urinalysis dipstick  2. Urinary tract infection, site not specified -Force fluids AZO over the counter X2 days RTO prn - ciprofloxacin (CIPRO) 500 MG tablet; Take 1 tablet (500 mg total) by mouth 2 (two) times daily.  Dispense: 20 tablet; Refill: 0  3. Constipation, unspecified constipation type -May need to decrease pain medications -Use mag citrate and MOM until pt is cleaned out - Naloxegol Oxalate (MOVANTIK) 25 MG TABS; Take 25 mg by mouth every morning.  Dispense: 30 tablet; Refill: [place], FNP

## 2014-03-09 NOTE — Telephone Encounter (Signed)
Kristen Marsh, [facility] will cover amitiza and linzess, i sent this to them but I am relatively certain they will not cover without her trying these two, i thought you might want to give her something before we hear back from them.  Thanks!

## 2014-03-13 ENCOUNTER — Other Ambulatory Visit (HOSPITAL_COMMUNITY): Payer: Self-pay | Admitting: Otolaryngology

## 2014-03-13 MED ORDER — LUBIPROSTONE 24 MCG PO CAPS: 24.0000 ug | ORAL_CAPSULE | Freq: Two times a day (BID) | ORAL | Status: DC

## 2014-03-13 NOTE — Telephone Encounter (Signed)
Amitiza RX sent to pharmacy

## 2014-03-20 ENCOUNTER — Encounter (HOSPITAL_COMMUNITY): Payer: Self-pay | Admitting: Pharmacy Technician

## 2014-03-20 NOTE — H&P (Signed)
03/22/14 1:26 PM  Kristen Marsh  PREOPERATIVE HISTORY AND PHYSICAL  CHIEF COMPLAINT: nasal crusting, false vocal fold lesions  HISTORY: This is a 58 year old who presented with nasal crusting, false vocal fold lesions and hoarseness.  She now presents for nasal endoscopy with biopsy and direct laryngoscopy with biopsy.  Dr. Simeon Craft, [place] has discussed the risks (bleeding, airway swelling, epistaxis, dental injury, risks of general anesthesia, etc.), benefits, and alternatives of this procedure. The patient understands the risks and would like to proceed with the procedure. The chances of success of the procedure are >50% and the patient understands this. I personally performed an examination of the patient within 24 hours of the procedure.  PAST MEDICAL HISTORY: Past Medical History  Diagnosis Date  . Osteoarthritis   . Hep C w/o coma, chronic   . Emphysema   . GERD (gastroesophageal reflux disease)   . IBS (irritable bowel syndrome)   . Psoriasis   . MRSA carrier   . Fibromyalgia     PAST SURGICAL HISTORY: Past Surgical History  Procedure Laterality Date  . Hysteroscopy    . Appendectomy    . Abdominal hysterectomy      MEDICATIONS: No current facility-administered medications on file prior to encounter.   Current Outpatient Prescriptions on File Prior to Encounter  Medication Sig Dispense Refill  . albuterol (PROVENTIL HFA;VENTOLIN HFA) 108 (90 BASE) MCG/ACT inhaler Inhale 2 puffs into the lungs every 4 (four) hours as needed for wheezing.  1 Inhaler  0  . ciprofloxacin (CIPRO) 500 MG tablet Take 1 tablet (500 mg total) by mouth 2 (two) times daily.  20 tablet  0  . dicyclomine (BENTYL) 20 MG tablet Take 20 mg by mouth every 6 (six) hours.      . furosemide (LASIX) 40 MG tablet Take 1 tablet (40 mg total) by mouth daily.  30 tablet  2  . lubiprostone (AMITIZA) 24 MCG capsule Take 1 capsule (24 mcg total) by mouth 2 (two) times daily with a meal.  60 capsule  6  .  Naloxegol Oxalate (MOVANTIK) 25 MG TABS Take 25 mg by mouth every morning.  30 tablet  6  . nystatin (MYCOSTATIN) 100000 UNIT/ML suspension Take 5 mLs (500,000 Units total) by mouth 4 (four) times daily. Swish and swallow  60 mL  0  . omeprazole (PRILOSEC) 40 MG capsule Take 1 capsule (40 mg total) by mouth daily.  30 capsule  3  . oxyCODONE-acetaminophen (PERCOCET/ROXICET) 5-325 MG per tablet Take 1 tablet by mouth every 6 (six) hours as needed for severe pain.      Marland Kitchen senna-docusate (SENOKOT-S) 8.6-50 MG per tablet Take 1 tablet by mouth daily.        ALLERGIES: Allergies  Allergen Reactions  . Codeine Itching  . Hydrocodone-Acetaminophen Itching  . Meloxicam Swelling    Face swelling  . Montelukast Cough      SOCIAL HISTORY: History   Social History  . Marital Status: Divorced    Spouse Name: N/A    Number of Children: N/A  . Years of Education: N/A   Occupational History  . Not on file.   Social History Main Topics  . Smoking status: Current Every Day Smoker  . Smokeless tobacco: Not on file  . Alcohol Use: Yes  . Drug Use: No  . Sexual Activity: Yes    Birth Control/ Protection: Surgical   Other Topics Concern  . Not on file   Social History Narrative  . No narrative on file  FAMILY HISTORY: Family History  Problem Relation Age of Onset  . Cancer Mother     esophageal  . Cancer Brother     REVIEW OF SYSTEMS:  HEENT:hoarseness, otherwise negative x 12 systems except per HPI   PHYSICAL EXAM:  GENERAL:  NAD VITAL SIGNS:   Filed Vitals:   03/22/14 1150  BP: 120/78  Pulse: 107  Temp: 98.3 F (36.8 C)  Resp: 16   SKIN:  Warm, dry HEENT:  Nasal crusting and hoarseness NECK:  supple LYMPH:  No palpable Lymphadenopathy ABDOMEN:  soft MUSCULOSKELETAL: normal strength PSYCH:  Normal affect NEUROLOGIC:  CN 2-12 intact and symmetric   ASSESSMENT AND PLAN: Plan to proceed with nasal endoscopy with biopsy and direct laryngoscopy with biopsy.  Patient understands the risks, benefits, and alternatives. Informed written consent signed, witnessed, and on chart. Kristen Marsh 1:26 PM 03/22/2014

## 2014-03-21 ENCOUNTER — Encounter (HOSPITAL_COMMUNITY): Payer: Self-pay | Admitting: *Deleted

## 2014-03-21 NOTE — Progress Notes (Signed)
03/21/14 1555  OBSTRUCTIVE SLEEP APNEA  Have you ever been diagnosed with sleep apnea through a sleep study? No  Do you snore loudly (loud enough to be heard through closed doors)?  1  Do you often feel tired, fatigued, or sleepy during the daytime? 1  Has anyone observed you stop breathing during your sleep? 0  Do you have, or are you being treated for high blood pressure? 1  BMI more than 35 kg/m2? 0  Age over 56 years old? 1  Gender: 0  Obstructive Sleep Apnea Score 4

## 2014-03-21 NOTE — Progress Notes (Signed)
Spoke with Linus Orn, CMA to make MD aware that pt stated that she took [facility] powder this morning for a headache.

## 2014-03-21 NOTE — Progress Notes (Signed)
Pt denies chest pain and being under the care of a cardiologist but stated that she gets SOB at times with exertion. Pt stated that she was diagnosed with " high blood pressure but stopped taking the medicine." Pt denied having an EKG within the last year. Pt denies having a stress test, echo, and cardiac cath. Pt made aware to stop  taking Aspirin, vitamins, and herbal medications. Do not take any NSAIDs ie: Ibuprofen, Advil, Naproxen or any medication containing Aspirin.

## 2014-03-22 ENCOUNTER — Ambulatory Visit (HOSPITAL_COMMUNITY): Payer: Medicaid Other | Admitting: Certified Registered Nurse Anesthetist

## 2014-03-22 ENCOUNTER — Encounter (HOSPITAL_COMMUNITY)
Admission: RE | Disposition: A | Discharge status: Home / Self Care | Payer: Self-pay | Source: Ambulatory Visit | Attending: Otolaryngology

## 2014-03-22 ENCOUNTER — Ambulatory Visit (HOSPITAL_COMMUNITY)
Admission: RE | Admit: 2014-03-22 | Discharge: 2014-03-22 | Disposition: A | Discharge status: Home / Self Care | Payer: Medicaid Other | Source: Ambulatory Visit | Attending: Otolaryngology | Admitting: Otolaryngology

## 2014-03-22 ENCOUNTER — Encounter (HOSPITAL_COMMUNITY): Payer: Medicaid Other | Admitting: Certified Registered Nurse Anesthetist

## 2014-03-22 ENCOUNTER — Other Ambulatory Visit: Payer: Self-pay | Admitting: Family Medicine

## 2014-03-22 DIAGNOSIS — B1920 Unspecified viral hepatitis C without hepatic coma: Secondary | ICD-10-CM | POA: Diagnosis not present

## 2014-03-22 DIAGNOSIS — J438 Other emphysema: Secondary | ICD-10-CM | POA: Diagnosis not present

## 2014-03-22 DIAGNOSIS — J310 Chronic rhinitis: Secondary | ICD-10-CM | POA: Diagnosis not present

## 2014-03-22 DIAGNOSIS — J329 Chronic sinusitis, unspecified: Secondary | ICD-10-CM | POA: Diagnosis not present

## 2014-03-22 DIAGNOSIS — Z79899 Other long term (current) drug therapy: Secondary | ICD-10-CM | POA: Diagnosis not present

## 2014-03-22 DIAGNOSIS — R490 Dysphonia: Secondary | ICD-10-CM | POA: Diagnosis present

## 2014-03-22 DIAGNOSIS — J370 Chronic laryngitis: Secondary | ICD-10-CM | POA: Insufficient documentation

## 2014-03-22 DIAGNOSIS — M1990 Unspecified osteoarthritis, unspecified site: Secondary | ICD-10-CM | POA: Diagnosis not present

## 2014-03-22 DIAGNOSIS — IMO0001 Reserved for inherently not codable concepts without codable children: Secondary | ICD-10-CM | POA: Insufficient documentation

## 2014-03-22 DIAGNOSIS — F17200 Nicotine dependence, unspecified, uncomplicated: Secondary | ICD-10-CM | POA: Insufficient documentation

## 2014-03-22 DIAGNOSIS — L408 Other psoriasis: Secondary | ICD-10-CM | POA: Diagnosis not present

## 2014-03-22 DIAGNOSIS — Z885 Allergy status to narcotic agent status: Secondary | ICD-10-CM | POA: Insufficient documentation

## 2014-03-22 DIAGNOSIS — Z90710 Acquired absence of both cervix and uterus: Secondary | ICD-10-CM | POA: Diagnosis not present

## 2014-03-22 DIAGNOSIS — K589 Irritable bowel syndrome without diarrhea: Secondary | ICD-10-CM | POA: Insufficient documentation

## 2014-03-22 DIAGNOSIS — J343 Hypertrophy of nasal turbinates: Secondary | ICD-10-CM | POA: Diagnosis not present

## 2014-03-22 DIAGNOSIS — K219 Gastro-esophageal reflux disease without esophagitis: Secondary | ICD-10-CM | POA: Insufficient documentation

## 2014-03-22 DIAGNOSIS — G4730 Sleep apnea, unspecified: Secondary | ICD-10-CM

## 2014-03-22 DIAGNOSIS — Z888 Allergy status to other drugs, medicaments and biological substances status: Secondary | ICD-10-CM | POA: Diagnosis not present

## 2014-03-22 HISTORY — DX: Anxiety disorder, unspecified: F41.9

## 2014-03-22 HISTORY — DX: Pneumonia, unspecified organism: J18.9

## 2014-03-22 HISTORY — DX: Essential (primary) hypertension: I10

## 2014-03-22 HISTORY — DX: Shortness of breath: R06.02

## 2014-03-22 HISTORY — PX: DIRECT LARYNGOSCOPY: SHX5326

## 2014-03-22 HISTORY — DX: Calculus of kidney: N20.0

## 2014-03-22 LAB — COMPREHENSIVE METABOLIC PANEL
ALT: 38 U/L — ABNORMAL HIGH (ref 0–35)
ANION GAP: 17 — AB (ref 5–15)
AST: 123 U/L — ABNORMAL HIGH (ref 0–37)
Albumin: 2.5 g/dL — ABNORMAL LOW (ref 3.5–5.2)
Alkaline Phosphatase: 113 U/L (ref 39–117)
BUN: 3 mg/dL — ABNORMAL LOW (ref 6–23)
CO2: 21 meq/L (ref 19–32)
CREATININE: 0.55 mg/dL (ref 0.50–1.10)
Calcium: 7.9 mg/dL — ABNORMAL LOW (ref 8.4–10.5)
Chloride: 105 mEq/L (ref 96–112)
GFR calc Af Amer: >90 mL/min (ref >90)
Glucose, Bld: 94 mg/dL (ref 70–99)
Potassium: 3.2 mEq/L — ABNORMAL LOW (ref 3.7–5.3)
Sodium: 143 mEq/L (ref 137–147)
Total Bilirubin: 2.1 mg/dL — ABNORMAL HIGH (ref 0.3–1.2)
Total Protein: 7.4 g/dL (ref 6.0–8.3)

## 2014-03-22 LAB — CBC
HCT: 34.8 % — ABNORMAL LOW (ref 36.0–46.0)
Hemoglobin: 11.7 g/dL — ABNORMAL LOW (ref 12.0–15.0)
MCH: 31 pg (ref 26.0–34.0)
MCHC: 33.6 g/dL (ref 30.0–36.0)
MCV: 92.1 fL (ref 78.0–100.0)
Platelets: 101 10*3/uL — ABNORMAL LOW (ref 150–400)
RBC: 3.78 MIL/uL — AB (ref 3.87–5.11)
RDW: 16.6 % — ABNORMAL HIGH (ref 11.5–15.5)
WBC: 5.4 10*3/uL (ref 4.0–10.5)

## 2014-03-22 SURGERY — LARYNGOSCOPY, DIRECT
Anesthesia: General | Site: Mouth

## 2014-03-22 MED ORDER — MIDAZOLAM HCL 5 MG/5ML IJ SOLN
INTRAMUSCULAR | Status: DC | PRN
  Administered 2014-03-22: 2 mg via INTRAVENOUS

## 2014-03-22 MED ORDER — ONDANSETRON HCL 4 MG/2ML IJ SOLN: 4.0000 mg | Freq: Once | INTRAMUSCULAR | Status: DC | PRN

## 2014-03-22 MED ORDER — CEFAZOLIN SODIUM 1-5 GM-% IV SOLN: 1.0000 g | Freq: Once | INTRAVENOUS | Status: DC

## 2014-03-22 MED ORDER — CEFAZOLIN SODIUM-DEXTROSE 2-3 GM-% IV SOLR
2.0000 g | INTRAVENOUS | Status: AC
  Administered 2014-03-22: 1 g via INTRAVENOUS
  Filled 2014-03-22: qty 50

## 2014-03-22 MED ORDER — LIDOCAINE HCL (CARDIAC) 20 MG/ML IV SOLN
INTRAVENOUS | Status: DC | PRN
  Administered 2014-03-22: 100 mg via INTRAVENOUS

## 2014-03-22 MED ORDER — EPINEPHRINE HCL (NASAL) 0.1 % NA SOLN
NASAL | Status: AC
  Filled 2014-03-22: qty 30

## 2014-03-22 MED ORDER — LIDOCAINE HCL 4 % MT SOLN
OROMUCOSAL | Status: DC | PRN
  Administered 2014-03-22: 4 mL via TOPICAL

## 2014-03-22 MED ORDER — PROPOFOL 10 MG/ML IV BOLUS
INTRAVENOUS | Status: AC
  Filled 2014-03-22: qty 20

## 2014-03-22 MED ORDER — DEXAMETHASONE SODIUM PHOSPHATE 10 MG/ML IJ SOLN
INTRAMUSCULAR | Status: AC
  Filled 2014-03-22: qty 1

## 2014-03-22 MED ORDER — FENTANYL CITRATE 0.05 MG/ML IJ SOLN
INTRAMUSCULAR | Status: DC | PRN
  Administered 2014-03-22 (×5): 50 ug via INTRAVENOUS

## 2014-03-22 MED ORDER — LIDOCAINE HCL (CARDIAC) 20 MG/ML IV SOLN
INTRAVENOUS | Status: AC
  Filled 2014-03-22: qty 5

## 2014-03-22 MED ORDER — EPINEPHRINE HCL (NASAL) 0.1 % NA SOLN
NASAL | Status: DC | PRN
  Administered 2014-03-22: 30 mL via TOPICAL

## 2014-03-22 MED ORDER — NEOSTIGMINE METHYLSULFATE 10 MG/10ML IV SOLN
INTRAVENOUS | Status: DC | PRN
  Administered 2014-03-22: 3 mg via INTRAVENOUS

## 2014-03-22 MED ORDER — HYDROMORPHONE HCL PF 1 MG/ML IJ SOLN: 0.2500 mg | INTRAMUSCULAR | Status: DC | PRN

## 2014-03-22 MED ORDER — GLYCOPYRROLATE 0.2 MG/ML IJ SOLN
INTRAMUSCULAR | Status: DC | PRN
  Administered 2014-03-22: 0.4 mg via INTRAVENOUS

## 2014-03-22 MED ORDER — ONDANSETRON HCL 4 MG/2ML IJ SOLN
INTRAMUSCULAR | Status: DC | PRN
  Administered 2014-03-22 (×2): 4 mg via INTRAVENOUS

## 2014-03-22 MED ORDER — ROCURONIUM BROMIDE 100 MG/10ML IV SOLN
INTRAVENOUS | Status: DC | PRN
  Administered 2014-03-22: 40 mg via INTRAVENOUS

## 2014-03-22 MED ORDER — DEXAMETHASONE SODIUM PHOSPHATE 10 MG/ML IJ SOLN
10.0000 mg | Freq: Once | INTRAMUSCULAR | Status: AC
  Administered 2014-03-22: 10 mg via INTRAVENOUS

## 2014-03-22 MED ORDER — OXYMETAZOLINE HCL 0.05 % NA SOLN
NASAL | Status: AC
Start: 2014-03-22 — End: 2014-03-22
  Filled 2014-03-22: qty 15

## 2014-03-22 MED ORDER — STERILE WATER FOR IRRIGATION IR SOLN
Status: DC | PRN
  Administered 2014-03-22: 1000 mL

## 2014-03-22 MED ORDER — FENTANYL CITRATE 0.05 MG/ML IJ SOLN
INTRAMUSCULAR | Status: AC
  Filled 2014-03-22: qty 5

## 2014-03-22 MED ORDER — OXYCODONE HCL 5 MG/5ML PO SOLN: 5.0000 mg | Freq: Once | ORAL | Status: DC | PRN

## 2014-03-22 MED ORDER — AMINOCAPROIC ACID SOLUTION 5% (50 MG/ML)
5.0000 mL | ORAL | Status: DC
  Filled 2014-03-22: qty 100

## 2014-03-22 MED ORDER — LACTATED RINGERS IV SOLN
INTRAVENOUS | Status: DC | PRN
  Administered 2014-03-22 (×2): via INTRAVENOUS

## 2014-03-22 MED ORDER — PROPOFOL 10 MG/ML IV BOLUS
INTRAVENOUS | Status: DC | PRN
  Administered 2014-03-22: 200 mg via INTRAVENOUS

## 2014-03-22 MED ORDER — MIDAZOLAM HCL 2 MG/2ML IJ SOLN
INTRAMUSCULAR | Status: AC
  Filled 2014-03-22: qty 2

## 2014-03-22 MED ORDER — LACTATED RINGERS IV SOLN
INTRAVENOUS | Status: DC
  Administered 2014-03-22: 50 mL/h via INTRAVENOUS

## 2014-03-22 MED ORDER — ROCURONIUM BROMIDE 50 MG/5ML IV SOLN
INTRAVENOUS | Status: AC
  Filled 2014-03-22: qty 1

## 2014-03-22 MED ORDER — OXYCODONE HCL 5 MG PO TABS: 5.0000 mg | ORAL_TABLET | Freq: Once | ORAL | Status: DC | PRN

## 2014-03-22 SURGICAL SUPPLY — 21 items
CANISTER SUCTION 2500CC (MISCELLANEOUS) ×3 IMPLANT
COAGULATOR SUCT 6 FR SWTCH (ELECTROSURGICAL) ×1
COAGULATOR SUCT SWTCH 10FR 6 (ELECTROSURGICAL) ×2 IMPLANT
CONT SPEC 4OZ CLIKSEAL STRL BL (MISCELLANEOUS) ×3 IMPLANT
COVER MAYO STAND STRL (DRAPES) ×3 IMPLANT
COVER TABLE BACK 60X90 (DRAPES) ×3 IMPLANT
DRAPE PROXIMA HALF (DRAPES) ×3 IMPLANT
GAUZE SPONGE 4X4 12PLY STRL (GAUZE/BANDAGES/DRESSINGS) ×3 IMPLANT
GLOVE SURG SS PI 7.0 STRL IVOR (GLOVE) ×3 IMPLANT
GLOVE SURG SS PI 7.5 STRL IVOR (GLOVE) ×3 IMPLANT
GOWN STRL REUS W/ TWL LRG LVL3 (GOWN DISPOSABLE) ×2 IMPLANT
GOWN STRL REUS W/TWL LRG LVL3 (GOWN DISPOSABLE) ×4
GUARD TEETH (MISCELLANEOUS) ×3 IMPLANT
MARKER SKIN DUAL TIP RULER LAB (MISCELLANEOUS) IMPLANT
NS IRRIG 1000ML POUR BTL (IV SOLUTION) ×3 IMPLANT
PAD ARMBOARD 7.5X6 YLW CONV (MISCELLANEOUS) ×3 IMPLANT
PATTIES SURGICAL .5 X1 (DISPOSABLE) ×3 IMPLANT
SOLUTION ANTI FOG 6CC (MISCELLANEOUS) ×3 IMPLANT
TOWEL OR 17X24 6PK STRL BLUE (TOWEL DISPOSABLE) ×3 IMPLANT
TUBE CONNECTING 12'X1/4 (SUCTIONS) ×1
TUBE CONNECTING 12X1/4 (SUCTIONS) ×2 IMPLANT

## 2014-03-22 NOTE — Anesthesia Procedure Notes (Signed)
Procedure Name: Intubation Date/Time: 03/22/2014 1:59 PM Performed by: Trixie Deis A Pre-anesthesia Checklist: Patient identified, Timeout performed, Emergency Drugs available, Suction available and Patient being monitored Patient Re-evaluated:Patient Re-evaluated prior to inductionOxygen Delivery Method: Circle system utilized Preoxygenation: Pre-oxygenation with 100% oxygen Intubation Type: IV induction Ventilation: Mask ventilation without difficulty and Oral airway inserted - appropriate to patient size Laryngoscope Size: Mac and 3 Grade View: Grade I Tube type: Oral Tube size: 7.0 mm Number of attempts: 1 Airway Equipment and Method: Stylet Placement Confirmation: ETT inserted through vocal cords under direct vision,  breath sounds checked- equal and bilateral and positive ETCO2 Secured at: 21 cm Tube secured with: Tape Dental Injury: Teeth and Oropharynx as per pre-operative assessment

## 2014-03-22 NOTE — Anesthesia Postprocedure Evaluation (Signed)
Anesthesia Post Note  Patient: Kristen Marsh  Procedure(s) Performed: Procedure(s) (LRB): DIRECT LARYNGOSCOPY (N/A)  Anesthesia type: general  Patient location: PACU  Post pain: Pain level controlled  Post assessment: Patient's Cardiovascular Status Stable  Last Vitals:  Filed Vitals:   03/22/14 1515  BP: 163/95  Pulse: 114  Temp: 36.7 C  Resp: 24    Post vital signs: Reviewed and stable  Level of consciousness: sedated  Complications: No apparent anesthesia complications

## 2014-03-22 NOTE — Op Note (Signed)
DATE OF OPERATION: @8 /19/2015 Surgeon: Ruby Cola Procedure Performed: [phone] direct laryngoscopy with biopsy 31237-[id number] nasal endoscopy with inferior turbinate biopsy  PREOPERATIVE DIAGNOSIS: smoker, chronic hoarseness and chronic laryngitis and rhinitis with mucosal changes POSTOPERATIVE DIAGNOSIS: smoker, chronic hoarseness and chronic laryngitis and rhinitis with mucosal changes  SURGEON: Ruby Cola ANESTHESIA: General endotracheal.  ESTIMATED BLOOD LOSS: less than 25 mL.  DRAINS: none SPECIMENS: right false and true vocal cord biopsy-negative for malignancy on frozen section, left false vocal cord biopsy negative for malignancy on frozen, right nasal inferior turbinate biopsy for permanent pathology INDICATIONS: The patient is a 58yo F smoker with chronic hoarseness and chronic laryngitis and rhinitis with mucosal changes that did not resolve with oral steroids. DESCRIPTION OF OPERATION: The patient was brought to the operating room and was placed in the supine position and was placed under general endotracheal anesthesia by anesthesiology.   Direct laryngoscopy was performed using the Dedo laryngoscope and the 27mm rigid endoscope. She was a grade I view. The tongue base and posterior pharynx were normal. The false vocal folds and true vocal folds showed chronic mucosal changes that were improved from her office flexible laryngoscopy. Biopsies were performed using the straight biopsy forceps of the right false and true vocal folds, which were benign on frozen, and the left false vocal fold, also benign on frozen section. Hemostasis was obtained using topical 1:1000 epinephrine pledgets. Once hemostasis was noted the pledgets and the endoscope and Dedo laryngoscope were all removed along with the tooth guard.   Rigid nasal endoscopy was performed bilaterally with the 11mm rigid endoscope. This showed improved chronic inferior turbinate and septal mucosal inflammation. A right inferior  turbinate biopsy was taken using the straight biopsy forceps and sent for permanent pathology. Hemostasis was then obtained with the suction bovie and 0 degree scope.  The patient was turned back to anesthesia and awakened from anesthesia and extubated without difficulty. The patient tolerated the procedure well with no immediate complications and was taken to the postoperative recovery area in good condition.   Dr. Ruby Cola was present and performed the entire procedure. 03/22/2014  3:10 PM Ruby Cola

## 2014-03-22 NOTE — Discharge Instructions (Addendum)
Regular diet, up ad lib, follow up with Dr. Simeon Craft in 1 to 2 weeks. Rx for prednisone taper on chart. Use OTC tylenol as needed for pain.  What to eat:  For your first meals, you should eat lightly; only small meals initially.  If you do not have nausea, you may eat larger meals.  Avoid spicy, greasy and heavy food.    General Anesthesia, Adult, Care After  Refer to this sheet in the next few weeks. These instructions provide you with information on caring for yourself after your procedure. Your health care provider may also give you more specific instructions. Your treatment has been planned according to current medical practices, but problems sometimes occur. Call your health care provider if you have any problems or questions after your procedure.  WHAT TO EXPECT AFTER THE PROCEDURE  After the procedure, it is typical to experience:  Sleepiness.  Nausea and vomiting. HOME CARE INSTRUCTIONS  For the first 24 hours after general anesthesia:  Have a responsible person with you.  Do not drive a car. If you are alone, do not take public transportation.  Do not drink alcohol.  Do not take medicine that has not been prescribed by your health care provider.  Do not sign important papers or make important decisions.  You may resume a normal diet and activities as directed by your health care provider.  Change bandages (dressings) as directed.  If you have questions or problems that seem related to general anesthesia, call the hospital and ask for the anesthetist or anesthesiologist on call. SEEK MEDICAL CARE IF:  You have nausea and vomiting that continue the day after anesthesia.  You develop a rash. SEEK IMMEDIATE MEDICAL CARE IF:  You have difficulty breathing.  You have chest pain.  You have any allergic problems. Document Released: 10/27/2000 Document Revised: 03/23/2013 Document Reviewed: 02/03/2013  [facility] Patient Information 2014 [place], [place].

## 2014-03-22 NOTE — Anesthesia Preprocedure Evaluation (Addendum)
Anesthesia Evaluation  Patient identified by MRN, date of birth, ID band Patient awake    Reviewed: Allergy & Precautions, H&P , NPO status , Patient's Chart, lab work & pertinent test results  Airway Mallampati: I TM Distance: >3 FB Neck ROM: Full    Dental  (+) Teeth Intact, Dental Advisory Given   Pulmonary Current Smoker,  breath sounds clear to auscultation        Cardiovascular hypertension, Pt. on medications Rhythm:Regular Rate:Normal     Neuro/Psych    GI/Hepatic GERD-  Controlled and Medicated,  Endo/Other    Renal/GU      Musculoskeletal   Abdominal   Peds  Hematology   Anesthesia Other Findings   Reproductive/Obstetrics                          Anesthesia Physical Anesthesia Plan  ASA: III  Anesthesia Plan: General   Post-op Pain Management:    Induction: Intravenous  Airway Management Planned: Oral ETT  Additional Equipment:   Intra-op Plan:   Post-operative Plan: Extubation in OR  Informed Consent: I have reviewed the patients History and Physical, chart, labs and discussed the procedure including the risks, benefits and alternatives for the proposed anesthesia with the patient or authorized representative who has indicated his/her understanding and acceptance.   Dental advisory given  Plan Discussed with: CRNA, Anesthesiologist and Surgeon  Anesthesia Plan Comments:         Anesthesia Quick Evaluation

## 2014-03-22 NOTE — Transfer of Care (Signed)
Immediate Anesthesia Transfer of Care Note  Patient: Kristen Marsh  Procedure(s) Performed: Procedure(s) with comments: DIRECT LARYNGOSCOPY (N/A) - With biopsy  Patient Location: PACU  Anesthesia Type:General  Level of Consciousness: awake, alert  and oriented  Airway & Oxygen Therapy: Patient Spontanous Breathing and Patient connected to nasal cannula oxygen  Post-op Assessment: Report given to PACU RN, Post -op Vital signs reviewed and stable and Patient moving all extremities  Post vital signs: Reviewed and stable  Complications: No apparent anesthesia complications

## 2014-03-23 ENCOUNTER — Encounter (HOSPITAL_COMMUNITY): Payer: Self-pay | Admitting: Otolaryngology

## 2014-03-24 ENCOUNTER — Telehealth: Payer: Self-pay | Admitting: *Deleted

## 2014-03-24 DIAGNOSIS — IMO0001 Reserved for inherently not codable concepts without codable children: Secondary | ICD-10-CM

## 2014-03-24 MED ORDER — ALBUTEROL SULFATE HFA 108 (90 BASE) MCG/ACT IN AERS: 2.0000 | INHALATION_SPRAY | RESPIRATORY_TRACT | Status: DC | PRN

## 2014-03-24 NOTE — Telephone Encounter (Signed)
Received fax from pharmacy requesting refill on Proair but current med list states she is on Proventil. Please advise on refill

## 2014-03-24 NOTE — Addendum Note (Signed)
Addended by: Evelina Dun A on: 03/24/2014 04:32 PM   Modules accepted: Orders

## 2014-03-29 ENCOUNTER — Emergency Department (HOSPITAL_COMMUNITY): Payer: Medicaid Other

## 2014-03-29 ENCOUNTER — Encounter (HOSPITAL_COMMUNITY): Payer: Self-pay | Admitting: Emergency Medicine

## 2014-03-29 ENCOUNTER — Emergency Department (HOSPITAL_COMMUNITY)
Admission: EM | Admit: 2014-03-29 | Discharge: 2014-03-29 | Disposition: A | Discharge status: Home / Self Care | Payer: Medicaid Other | Attending: Emergency Medicine | Admitting: Emergency Medicine

## 2014-03-29 DIAGNOSIS — R21 Rash and other nonspecific skin eruption: Secondary | ICD-10-CM | POA: Insufficient documentation

## 2014-03-29 DIAGNOSIS — F17200 Nicotine dependence, unspecified, uncomplicated: Secondary | ICD-10-CM | POA: Diagnosis not present

## 2014-03-29 DIAGNOSIS — R142 Eructation: Secondary | ICD-10-CM

## 2014-03-29 DIAGNOSIS — K589 Irritable bowel syndrome without diarrhea: Secondary | ICD-10-CM | POA: Diagnosis not present

## 2014-03-29 DIAGNOSIS — Z8614 Personal history of Methicillin resistant Staphylococcus aureus infection: Secondary | ICD-10-CM | POA: Diagnosis not present

## 2014-03-29 DIAGNOSIS — Z87442 Personal history of urinary calculi: Secondary | ICD-10-CM | POA: Insufficient documentation

## 2014-03-29 DIAGNOSIS — K219 Gastro-esophageal reflux disease without esophagitis: Secondary | ICD-10-CM | POA: Diagnosis not present

## 2014-03-29 DIAGNOSIS — R109 Unspecified abdominal pain: Secondary | ICD-10-CM | POA: Diagnosis present

## 2014-03-29 DIAGNOSIS — M1990 Unspecified osteoarthritis, unspecified site: Secondary | ICD-10-CM | POA: Insufficient documentation

## 2014-03-29 DIAGNOSIS — R141 Gas pain: Secondary | ICD-10-CM | POA: Insufficient documentation

## 2014-03-29 DIAGNOSIS — Z8701 Personal history of pneumonia (recurrent): Secondary | ICD-10-CM | POA: Diagnosis not present

## 2014-03-29 DIAGNOSIS — Z79899 Other long term (current) drug therapy: Secondary | ICD-10-CM | POA: Diagnosis not present

## 2014-03-29 DIAGNOSIS — Z872 Personal history of diseases of the skin and subcutaneous tissue: Secondary | ICD-10-CM | POA: Insufficient documentation

## 2014-03-29 DIAGNOSIS — J438 Other emphysema: Secondary | ICD-10-CM | POA: Diagnosis not present

## 2014-03-29 DIAGNOSIS — I10 Essential (primary) hypertension: Secondary | ICD-10-CM | POA: Insufficient documentation

## 2014-03-29 DIAGNOSIS — F411 Generalized anxiety disorder: Secondary | ICD-10-CM | POA: Insufficient documentation

## 2014-03-29 DIAGNOSIS — IMO0001 Reserved for inherently not codable concepts without codable children: Secondary | ICD-10-CM | POA: Insufficient documentation

## 2014-03-29 DIAGNOSIS — R188 Other ascites: Secondary | ICD-10-CM | POA: Diagnosis not present

## 2014-03-29 DIAGNOSIS — K7460 Unspecified cirrhosis of liver: Secondary | ICD-10-CM

## 2014-03-29 DIAGNOSIS — B182 Chronic viral hepatitis C: Secondary | ICD-10-CM | POA: Diagnosis not present

## 2014-03-29 DIAGNOSIS — R143 Flatulence: Secondary | ICD-10-CM | POA: Diagnosis not present

## 2014-03-29 DIAGNOSIS — R1084 Generalized abdominal pain: Secondary | ICD-10-CM

## 2014-03-29 LAB — CBC WITH DIFFERENTIAL/PLATELET
BASOS PCT: 0 % (ref 0–1)
Basophils Absolute: 0 10*3/uL (ref 0.0–0.1)
EOS PCT: 0 % (ref 0–5)
Eosinophils Absolute: 0 10*3/uL (ref 0.0–0.7)
HEMATOCRIT: 35.7 % — AB (ref 36.0–46.0)
HEMOGLOBIN: 11.9 g/dL — AB (ref 12.0–15.0)
LYMPHS PCT: 11 % — AB (ref 12–46)
Lymphs Abs: 0.9 10*3/uL (ref 0.7–4.0)
MCH: 30.7 pg (ref 26.0–34.0)
MCHC: 33.3 g/dL (ref 30.0–36.0)
MCV: 92.2 fL (ref 78.0–100.0)
Monocytes Absolute: 0.8 10*3/uL (ref 0.1–1.0)
Monocytes Relative: 9 % (ref 3–12)
Neutro Abs: 6.9 10*3/uL (ref 1.7–7.7)
Neutrophils Relative %: 80 % — ABNORMAL HIGH (ref 43–77)
Platelets: ADEQUATE 10*3/uL (ref 150–400)
RBC: 3.87 MIL/uL (ref 3.87–5.11)
RDW: 17.5 % — ABNORMAL HIGH (ref 11.5–15.5)
SMEAR REVIEW: ADEQUATE
WBC: 8.6 10*3/uL (ref 4.0–10.5)

## 2014-03-29 LAB — URINALYSIS, ROUTINE W REFLEX MICROSCOPIC
BILIRUBIN URINE: NEGATIVE
GLUCOSE, UA: NEGATIVE mg/dL
Hgb urine dipstick: NEGATIVE
Ketones, ur: NEGATIVE mg/dL
LEUKOCYTES UA: NEGATIVE
Nitrite: NEGATIVE
PH: 8.5 — AB (ref 5.0–8.0)
Protein, ur: NEGATIVE mg/dL
SPECIFIC GRAVITY, URINE: 1.015 (ref 1.005–1.030)
Urobilinogen, UA: 1 mg/dL (ref 0.0–1.0)

## 2014-03-29 LAB — COMPREHENSIVE METABOLIC PANEL
ALK PHOS: 105 U/L (ref 39–117)
ALT: 43 U/L — ABNORMAL HIGH (ref 0–35)
AST: 82 U/L — AB (ref 0–37)
Albumin: 2.5 g/dL — ABNORMAL LOW (ref 3.5–5.2)
Anion gap: 12 (ref 5–15)
BILIRUBIN TOTAL: 3.1 mg/dL — AB (ref 0.3–1.2)
BUN: 6 mg/dL (ref 6–23)
CHLORIDE: 103 meq/L (ref 96–112)
CO2: 22 meq/L (ref 19–32)
Calcium: 8.4 mg/dL (ref 8.4–10.5)
Creatinine, Ser: 0.59 mg/dL (ref 0.50–1.10)
GFR calc Af Amer: >90 mL/min (ref >90)
GFR calc non Af Amer: >90 mL/min (ref >90)
Glucose, Bld: 110 mg/dL — ABNORMAL HIGH (ref 70–99)
POTASSIUM: 4.3 meq/L (ref 3.7–5.3)
SODIUM: 137 meq/L (ref 137–147)
Total Protein: 7.2 g/dL (ref 6.0–8.3)

## 2014-03-29 LAB — PROTIME-INR
INR: 1.66 — AB (ref 0.00–1.49)
Prothrombin Time: 19.6 seconds — ABNORMAL HIGH (ref 11.6–15.2)

## 2014-03-29 LAB — AMMONIA: Ammonia: 56 umol/L (ref 11–60)

## 2014-03-29 LAB — LACTIC ACID, PLASMA: Lactic Acid, Venous: 1.9 mmol/L (ref 0.5–2.2)

## 2014-03-29 MED ORDER — HYDROMORPHONE HCL PF 1 MG/ML IJ SOLN
1.0000 mg | INTRAMUSCULAR | Status: DC | PRN
  Administered 2014-03-29: 1 mg via INTRAVENOUS
  Filled 2014-03-29: qty 1

## 2014-03-29 MED ORDER — LACTULOSE 10 GM/15ML PO SOLN: 10.0000 g | Freq: Two times a day (BID) | ORAL | Status: DC

## 2014-03-29 MED ORDER — ONDANSETRON HCL 4 MG/2ML IJ SOLN
4.0000 mg | Freq: Once | INTRAMUSCULAR | Status: AC
  Administered 2014-03-29: 4 mg via INTRAVENOUS
  Filled 2014-03-29: qty 2

## 2014-03-29 MED ORDER — FUROSEMIDE 40 MG PO TABS
40.0000 mg | ORAL_TABLET | Freq: Two times a day (BID) | ORAL | Status: DC
Start: 2014-03-29 — End: 2014-05-22

## 2014-03-29 MED ORDER — SPIRONOLACTONE 25 MG PO TABS: 25.0000 mg | ORAL_TABLET | Freq: Every day | ORAL | Status: DC

## 2014-03-29 NOTE — ED Notes (Signed)
Pt admits to being seen at a couple different facilities for chronic abd pain related to cirrhosis.  Pt states she has been hurting worse for the past several days, denies vomiting or diarrhea

## 2014-03-29 NOTE — Discharge Instructions (Signed)
Follow up with your Primary care physician. Do not stop your chronic medications.  Abdominal Pain Many things can cause abdominal pain. Usually, abdominal pain is not caused by a disease and will improve without treatment. It can often be observed and treated at home. Your health care provider will do a physical exam and possibly order blood tests and X-rays to help determine the seriousness of your pain. However, in many cases, more time must pass before a clear cause of the pain can be found. Before that point, your health care provider may not know if you need more testing or further treatment. HOME CARE INSTRUCTIONS  Monitor your abdominal pain for any changes. The following actions may help to alleviate any discomfort you are experiencing:  Only take over-the-counter or prescription medicines as directed by your health care provider.  Do not take laxatives unless directed to do so by your health care provider.  Try a clear liquid diet (broth, tea, or water) as directed by your health care provider. Slowly move to a bland diet as tolerated. SEEK MEDICAL CARE IF:  You have unexplained abdominal pain.  You have abdominal pain associated with nausea or diarrhea.  You have pain when you urinate or have a bowel movement.  You experience abdominal pain that wakes you in the night.  You have abdominal pain that is worsened or improved by eating food.  You have abdominal pain that is worsened with eating fatty foods.  You have a fever. SEEK IMMEDIATE MEDICAL CARE IF:   Your pain does not go away within 2 hours.  You keep throwing up (vomiting).  Your pain is felt only in portions of the abdomen, such as the right side or the left lower portion of the abdomen.  You pass bloody or black tarry stools. MAKE SURE YOU:  Understand these instructions.   Will watch your condition.   Will get help right away if you are not doing well or get worse.  Document Released: 04/30/2005  Document Revised: 07/26/2013 Document Reviewed: 03/30/2013 [facility] Patient Information 2015 [place], [place]. This information is not intended to replace advice given to you by your health care provider. Make sure you discuss any questions you have with your health care provider.  Cirrhosis Cirrhosis is a condition of scarring of the liver which is caused when the liver has tried repairing itself following damage. This damage may come from a previous infection such as one of the forms of hepatitis (usually hepatitis C), or the damage may come from being injured by toxins. The main toxin that causes this damage is alcohol. The scarring of the liver from use of alcohol is irreversible. That means the liver cannot return to normal even though alcohol is not used any more. The main danger of hepatitis C infection is that it may cause long-lasting (chronic) liver disease, and this also may lead to cirrhosis. This complication is progressive and irreversible. CAUSES  Prior to available blood tests, hepatitis C could be contracted by blood transfusions. Since testing of blood has improved, this is now unlikely. This infection can also be contracted through intravenous drug use and the sharing of needles. It can also be contracted through sexual relationships. The injury caused by alcohol comes from too much use. It is not a few drinks that poison the liver, but years of misuse. Usually there will be some signs and symptoms early with scarring of the liver that suggest the development of better habits. Alcohol should never be used while  using acetaminophen. A small dose of both taken together may cause irreversible damage to the liver. HOME CARE INSTRUCTIONS  There is no specific treatment for cirrhosis. However, there are things you can do to avoid making the condition worse.  Rest as needed.  Eat a well-balanced diet. Your caregiver can help you with suggestions.  Vitamin supplements including vitamins A, K,  D, and thiamine can help.  A low-salt diet, water restriction, or diuretic medicine may be needed to reduce fluid retention.  Avoid alcohol. This can be extremely toxic if combined with acetaminophen.  Avoid drugs which are toxic to the liver. Some of these include isoniazid, methyldopa, acetaminophen, anabolic steroids (muscle-building drugs), erythromycin, and oral contraceptives (birth control pills). Check with your caregiver to make sure medicines you are presently taking will not be harmful.  Periodic blood tests may be required. Follow your caregiver's advice regarding the timing of these.  Milk thistle is an herbal remedy which does protect the liver against toxins. However, it will not help once the liver has been scarred. SEEK MEDICAL CARE IF:  You have increasing fatigue or weakness.  You develop swelling of the hands, feet, legs, or face.  You vomit bright red blood, or a coffee ground appearing material.  You have blood in your stools, or the stools turn black and tarry.  You have a fever.  You develop loss of appetite, or have nausea and vomiting.  You develop jaundice.  You develop easy bruising or bleeding.  You have worsening of any of the problems you are concerned about. Document Released: 07/21/2005 Document Revised: 10/13/2011 Document Reviewed: 03/08/2008 [facility] Patient Information 2015 [place], [place]. This information is not intended to replace advice given to you by your health care provider. Make sure you discuss any questions you have with your health care provider.

## 2014-03-29 NOTE — ED Notes (Signed)
Pt ambulated to BR without difficulty

## 2014-03-29 NOTE — ED Provider Notes (Addendum)
CSN: [id number]     Arrival date & time 03/29/14  2040 History  This chart was scribed for Tanna Furry, MD by Randa Evens, ED Scribe. This patient was seen in room APA15/APA15 and the patient's care was started at 9:15 PM.      Chief Complaint  Patient presents with  . Abdominal Pain    Patient is a 58 y.o. female presenting with abdominal pain. The history is provided by the patient. No language interpreter was used.  Abdominal Pain Associated symptoms: diarrhea   Associated symptoms: no chest pain, no chills, no cough, no dysuria, no fatigue, no fever, no hematuria, no nausea, no shortness of breath, no sore throat and no vomiting    HPI Comments: Kristen Marsh is a 58 y.o. female who presents to the Emergency Department complaining of abdominal pain. She states she has associated abdominal distention and yellow diarrhea. She states she has a Hx of cirrhosis of the liver 2 months ago. She sates she was taking prescribed medication but recently ran out 1 week ago. She states when she takes the medication her chronic abdominal problems are well controlled. She states he liver disease is from hepatitis C.  She states she recetly had an UTI 2 weeks ago. She states she recently had a Ct of her abdomen at [facility].  She denies leg swelling   Past Medical History  Diagnosis Date  . Osteoarthritis   . Hep C w/o coma, chronic   . Emphysema   . GERD (gastroesophageal reflux disease)   . IBS (irritable bowel syndrome)   . Psoriasis   . MRSA carrier   . Fibromyalgia   . Shortness of breath     with exertion  . Hypertension   . Pneumonia     As a child  . Anxiety   . Kidney stones    Past Surgical History  Procedure Laterality Date  . Hysteroscopy    . Appendectomy    . Abdominal hysterectomy    . Diagnostic laparoscopy      exploratory lap  . Colonoscopy w/ biopsies and polypectomy    . Direct laryngoscopy N/A 03/22/2014    Procedure: DIRECT LARYNGOSCOPY;  Surgeon: Ruby Cola,  MD;  Location: [facility] OR;  Service: ENT;  Laterality: N/A;  With biopsy   Family History  Problem Relation Age of Onset  . Cancer Mother     esophageal  . Cancer Brother    History  Substance Use Topics  . Smoking status: Current Every Day Smoker -- 0.50 packs/day    Types: Cigarettes  . Smokeless tobacco: Never Used  . Alcohol Use: Yes     Comment: "about 1-3 drinks per week of mostly beer"   OB History   Grav Para Term Preterm Abortions TAB SAB Ect Mult Living                 Review of Systems  Constitutional: Negative for fever, chills, diaphoresis, appetite change and fatigue.  HENT: Negative for mouth sores, sore throat and trouble swallowing.   Eyes: Negative for visual disturbance.  Respiratory: Negative for cough, chest tightness, shortness of breath and wheezing.   Cardiovascular: Negative for chest pain and leg swelling.  Gastrointestinal: Positive for abdominal pain, diarrhea and abdominal distention. Negative for nausea and vomiting.  Endocrine: Negative for polydipsia, polyphagia and polyuria.  Genitourinary: Negative for dysuria, frequency and hematuria.  Musculoskeletal: Negative for gait problem.  Skin: Negative for color change, pallor and rash.  Neurological: Negative  for dizziness, syncope, light-headedness and headaches.  Hematological: Does not bruise/bleed easily.  Psychiatric/Behavioral: Negative for behavioral problems and confusion.    Allergies  Codeine; Hydrocodone-acetaminophen; Meloxicam; and Montelukast  Home Medications   Prior to Admission medications   Medication Sig Start Date End Date Taking? Authorizing Provider  albuterol (PROVENTIL HFA;VENTOLIN HFA) 108 (90 BASE) MCG/ACT inhaler Inhale 2 puffs into the lungs every 4 (four) hours as needed for wheezing. 03/24/14  Yes Sharion Balloon, FNP  albuterol (PROVENTIL) (2.5 MG/3ML) 0.083% nebulizer solution Take 2.5 mg by nebulization every 6 (six) hours as needed for wheezing or shortness of  breath.   Yes Historical Provider, MD  dicyclomine (BENTYL) 20 MG tablet Take 20 mg by mouth every 6 (six) hours.   Yes Historical Provider, MD  furosemide (LASIX) 40 MG tablet Take 1 tablet (40 mg total) by mouth daily. 01/19/14  Yes Mary-Margaret Hassell Done, FNP  lubiprostone (AMITIZA) 24 MCG capsule Take 1 capsule (24 mcg total) by mouth 2 (two) times daily with a meal. 03/13/14  Yes Sharion Balloon, FNP  Multiple Vitamin (MULTIVITAMIN WITH MINERALS) TABS tablet Take 1 tablet by mouth daily.   Yes Historical Provider, MD  omeprazole (PRILOSEC) 40 MG capsule Take 1 capsule (40 mg total) by mouth daily. 10/15/13  Yes Mary-Margaret Hassell Done, FNP  oxyCODONE-acetaminophen (PERCOCET/ROXICET) 5-325 MG per tablet Take 1 tablet by mouth 2 (two) times daily as needed (pain).    Yes Historical Provider, MD  potassium chloride (K-DUR) 10 MEQ tablet Take 10 mEq by mouth daily.   Yes Historical Provider, MD  senna-docusate (SENOKOT-S) 8.6-50 MG per tablet Take 1 tablet by mouth daily.   Yes Historical Provider, MD  furosemide (LASIX) 40 MG tablet Take 1 tablet (40 mg total) by mouth 2 (two) times daily. 03/29/14   Tanna Furry, MD  lactulose (CHRONULAC) 10 GM/15ML solution Take 15 mLs (10 g total) by mouth 2 (two) times daily. 03/29/14   Tanna Furry, MD  spironolactone (ALDACTONE) 25 MG tablet Take 1 tablet (25 mg total) by mouth daily. 03/29/14   Tanna Furry, MD   Triage Vitals: BP 157/78  Pulse 106  Temp(Src) 98.9 F (37.2 C) (Oral)  Resp 20  Ht 5\' 2"  (1.575 m)  Wt 155 lb (70.308 kg)  BMI 28.34 kg/m2  SpO2 96%  Physical Exam  Nursing note and vitals reviewed. Constitutional: She is oriented to person, place, and time. She appears well-developed and well-nourished. No distress.  Holding abdomen writhing in pain.   HENT:  Head: Normocephalic.  Eyes: Conjunctivae are normal. Pupils are equal, round, and reactive to light. Scleral icterus is present.  Slight Scleral icterus   Neck: Normal range of motion. Neck  supple. No thyromegaly present.  Cardiovascular: Normal rate and regular rhythm.  Exam reveals no gallop and no friction rub.   No murmur heard. Pulmonary/Chest: Effort normal and breath sounds normal. No respiratory distress. She has no wheezes. She has no rales.  Abdominal: Soft. She exhibits distension. There is tenderness. There is no rebound.  Perline Awe ably distended  Hypo active bowel sounds Diffusely tender  Musculoskeletal: Normal range of motion.  Neurological: She is alert and oriented to person, place, and time.  Skin: Skin is warm and dry. Rash noted.  Multiple spider angiomas  Psychiatric: She has a normal mood and affect. Her behavior is normal.    ED Course  Procedures (including critical care time) DIAGNOSTIC STUDIES: Oxygen Saturation is 96% on RA, Adequate by my interpretation.    COORDINATION  OF CARE: 9:26 PM-Discussed treatment plan which includes  UA with pt at bedside and pt agreed to plan.     Labs Review Labs Reviewed  URINALYSIS, ROUTINE W REFLEX MICROSCOPIC - Abnormal; Notable for the following:    pH 8.5 (*)    All other components within normal limits  CBC WITH DIFFERENTIAL - Abnormal; Notable for the following:    Hemoglobin 11.9 (*)    HCT 35.7 (*)    RDW 17.5 (*)    Neutrophils Relative % 80 (*)    Lymphocytes Relative 11 (*)    All other components within normal limits  COMPREHENSIVE METABOLIC PANEL - Abnormal; Notable for the following:    Glucose, Bld 110 (*)    Albumin 2.5 (*)    AST 82 (*)    ALT 43 (*)    Total Bilirubin 3.1 (*)    All other components within normal limits  PROTIME-INR - Abnormal; Notable for the following:    Prothrombin Time 19.6 (*)    INR 1.66 (*)    All other components within normal limits  URINE CULTURE  AMMONIA  LACTIC ACID, PLASMA    Imaging Review Dg Abd Acute W/chest  03/29/2014   CLINICAL DATA:  Generalized abdominal pain, distention, diarrhea for 3 days. History of hepatic cirrhosis.  EXAM: ACUTE  ABDOMEN SERIES (ABDOMEN 2 VIEW & CHEST 1 VIEW)  COMPARISON:  Abdomen 03/09/2014.  Chest 11/01/2013.  FINDINGS: There is no evidence of dilated bowel loops or free intraperitoneal air. No radiopaque calculi or other significant radiographic abnormality is seen. Heart size and mediastinal contours are within normal limits. Both lungs are clear.  IMPRESSION: Negative abdominal radiographs.  No acute cardiopulmonary disease.   Electronically Signed   By: Lucienne Capers M.D.   On: 03/29/2014 22:40     EKG Interpretation None      MDM   Final diagnoses:  Cirrhosis of liver with ascites, unspecified hepatic cirrhosis type  Generalized abdominal pain  Chronic hepatitis C without hepatic coma     Patient without respiratory embarrassment due to her ascites. Think this is a chronic condition for her. We'll start her back on her diuretics including Lasix and spironolactone. Followup with her primary care physician. She states she has a pain contract with "my arthritis Dr.". Thus no narcotic prescriptions are given.    I personally performed the services described in this documentation, which was scribed in my presence. The recorded information has been reviewed and is accurate.      Tanna Furry, MD 03/29/14 3016  Tanna Furry, MD 03/29/14 [phone]

## 2014-03-31 LAB — URINE CULTURE

## 2014-05-11 ENCOUNTER — Telehealth: Payer: Self-pay | Admitting: Nurse Practitioner

## 2014-05-11 ENCOUNTER — Telehealth: Payer: Self-pay | Admitting: *Deleted

## 2014-05-11 DIAGNOSIS — R188 Other ascites: Principal | ICD-10-CM

## 2014-05-11 DIAGNOSIS — K7460 Unspecified cirrhosis of liver: Secondary | ICD-10-CM

## 2014-05-11 NOTE — Telephone Encounter (Signed)
referral made.

## 2014-05-11 NOTE — Telephone Encounter (Signed)
Please review ED visit. Patient needs referral for GI.

## 2014-05-11 NOTE — Telephone Encounter (Signed)
GI referral done. Call back if you haven't heard about an appointment in a few days.

## 2014-05-12 ENCOUNTER — Institutional Professional Consult (permissible substitution): Payer: Medicaid Other | Admitting: Pulmonary Disease

## 2014-05-16 ENCOUNTER — Other Ambulatory Visit: Payer: Self-pay | Admitting: Nurse Practitioner

## 2014-05-18 ENCOUNTER — Encounter (INDEPENDENT_AMBULATORY_CARE_PROVIDER_SITE_OTHER): Payer: Self-pay | Admitting: *Deleted

## 2014-05-22 ENCOUNTER — Other Ambulatory Visit: Payer: Self-pay | Admitting: *Deleted

## 2014-05-22 ENCOUNTER — Telehealth: Payer: Self-pay | Admitting: Nurse Practitioner

## 2014-05-22 MED ORDER — FUROSEMIDE 40 MG PO TABS: 40.0000 mg | ORAL_TABLET | Freq: Two times a day (BID) | ORAL | Status: DC

## 2014-05-22 NOTE — Telephone Encounter (Signed)
Meds were sent in on 05-19-14 but was sent to [facility] in [place]. Sent in to [facility] in [place] per patient request. Left detailed message on patients voicemail

## 2014-05-29 ENCOUNTER — Ambulatory Visit (INDEPENDENT_AMBULATORY_CARE_PROVIDER_SITE_OTHER): Payer: Medicaid Other | Admitting: Internal Medicine

## 2014-05-29 ENCOUNTER — Encounter (INDEPENDENT_AMBULATORY_CARE_PROVIDER_SITE_OTHER): Payer: Self-pay | Admitting: Internal Medicine

## 2014-05-29 VITALS — BP 124/60 | HR 80 | Temp 97.6°F | Ht 62.0 in | Wt 135.7 lb

## 2014-05-29 DIAGNOSIS — B1921 Unspecified viral hepatitis C with hepatic coma: Secondary | ICD-10-CM

## 2014-05-29 DIAGNOSIS — K7031 Alcoholic cirrhosis of liver with ascites: Secondary | ICD-10-CM

## 2014-05-29 LAB — CBC WITH DIFFERENTIAL/PLATELET
BASOS PCT: 1 % (ref 0–1)
Basophils Absolute: 0 10*3/uL (ref 0.0–0.1)
Eosinophils Absolute: 0.1 10*3/uL (ref 0.0–0.7)
Eosinophils Relative: 2 % (ref 0–5)
HCT: 36.4 % (ref 36.0–46.0)
Hemoglobin: 12.5 g/dL (ref 12.0–15.0)
LYMPHS PCT: 26 % (ref 12–46)
Lymphs Abs: 1.1 10*3/uL (ref 0.7–4.0)
MCH: 30 pg (ref 26.0–34.0)
MCHC: 34.3 g/dL (ref 30.0–36.0)
MCV: 87.5 fL (ref 78.0–100.0)
MONOS PCT: 17 % — AB (ref 3–12)
Monocytes Absolute: 0.7 10*3/uL (ref 0.1–1.0)
NEUTROS ABS: 2.4 10*3/uL (ref 1.7–7.7)
Neutrophils Relative %: 54 % (ref 43–77)
Platelets: 106 10*3/uL — ABNORMAL LOW (ref 150–400)
RBC: 4.16 MIL/uL (ref 3.87–5.11)
RDW: 15.6 % — ABNORMAL HIGH (ref 11.5–15.5)
WBC: 4.4 10*3/uL (ref 4.0–10.5)

## 2014-05-29 NOTE — Patient Instructions (Signed)
OV in 2 months.  

## 2014-05-29 NOTE — Progress Notes (Addendum)
Subjective:    Patient ID: Kristen Marsh, female    DOB: 09/26/1955, 58 y.o.   MRN: [id number]  HPI  Referred to our office by Shelah Lewandowsky NP for cirrhosis. She says she has had abdominal distention. Hx of cirrhosis. Hx of Hepatitis C for over 20 yrs. She has nausea. She has nausea in the morning. She says her appetite is not good.  She has a BM daily.  She says about 1 1/2 weeks ago her abdomen was distended and Lasix was increased to 40mg . Caregiver says patient weighted was around 170  3-4 months which she says was fluid. (Weight 12/08/2013 171). Last colonoscopy was 2 months ago with polyps by Dr. Adele Barthel at [facility]. Has been seeing for over a year.  03/2013 EGD/Colonoscopy: Dr. Adele Barthel  Findings: Esophagus: Normal without evidence of esophageal varices.  Stomach: Moderate portal hypertensive gastropathy and antral gastritis. Biopsied for H. Pylori with cold biopsy forceps. Retroflexion was normal and reveal no gastric varices.  Duodenum: Normal to the 3rd portion  Colon: Extensive sigmoid diverticulosis and internal hemorrhoids. One 3 mm sessile polyp in the transverse colon. Polypectomy with cold biopsy forceps was performed, and resection and retrieval were complete.  Pathology specimens:  ID Type Source Tests Collected by Time Destination  1 : R/O H. Pylori Ap Specimen Stomach PATHOLOGY TISSUE REQUEST Ree Kida, MD 03/17/2013 1:15 PM  2 : Ascending Ap Specimen Polyp PATHOLOGY TISSUE REQUEST Ree Kida, MD 03/17/2013 1:34 PM    Preop Diagnosis: CIRRHOSIS ABDOMINAL PAIN AND SCREENING Postop Diagnosis: gastritis, hemrrhoids, and diverticulosis  Impression: Portal hypertensive gastropathy, Gastritis (biopsied for H. Pylori), No gastric or esophageal varices. 3 mm colon polyp (resected and retrieved), diverticulosis and hemorrhoids.  Recommendations: Will check MELD labs. Continue proton pump inhibitor. Will follow-up pathology results. Repeat colonoscopy in 10  years for colorectal cancer screening.  03/10/2014 Abdomen: IMPRESSION:  Overall increased density noted over the abdomen. Ascites cannot be  excluded. Exam otherwise nonspecific. No bowel distention.    08/24/2012 [place] RUQ:  No evidence of acute abnormality.  Tiny amount of gallbladder sludge without evidence of cholecystitis  or cholelithiasis.  Hepatomegaly with probable fatty infiltration of the liver.   CMP     Component Value Date/Time   NA 137 03/29/2014 2219   NA 138 10/19/2013 1114   K 4.3 03/29/2014 2219   CL 103 03/29/2014 2219   CO2 22 03/29/2014 2219   GLUCOSE 110* 03/29/2014 2219   GLUCOSE 104* 10/19/2013 1114   BUN 6 03/29/2014 2219   BUN 12 10/19/2013 1114   CREATININE 0.59 03/29/2014 2219   CREATININE 0.67 01/14/2013 0948   CALCIUM 8.4 03/29/2014 2219   PROT 7.2 03/29/2014 2219   PROT 8.1 10/19/2013 1114   ALBUMIN 2.5* 03/29/2014 2219   AST 82* 03/29/2014 2219   ALT 43* 03/29/2014 2219   ALKPHOS 105 03/29/2014 2219   BILITOT 3.1* 03/29/2014 2219   GFRNONAA >90 03/29/2014 2219   GFRNONAA >89 01/14/2013 0948   GFRAA >90 03/29/2014 2219   GFRAA >89 01/14/2013 0948    CBC    Component Value Date/Time   WBC 8.6 03/29/2014 2219   WBC 10.1 01/14/2013 1045   RBC 3.87 03/29/2014 2219   RBC 5.0 01/14/2013 1045   HGB 11.9* 03/29/2014 2219   HGB 15.8 01/14/2013 1045   HCT 35.7* 03/29/2014 2219   HCT 45.6 01/14/2013 1045   PLT PLATELET CLUMPS NOTED ON SMEAR, COUNT APPEARS ADEQUATE 03/29/2014 2219   MCV 92.2  03/29/2014 2219   MCV 91.2 01/14/2013 1045   MCH 30.7 03/29/2014 2219   MCH 31.5* 01/14/2013 1045   MCHC 33.3 03/29/2014 2219   MCHC 34.6 01/14/2013 1045   RDW 17.5* 03/29/2014 2219   LYMPHSABS 0.9 03/29/2014 2219   MONOABS 0.8 03/29/2014 2219   EOSABS 0.0 03/29/2014 2219   BASOSABS 0.0 03/29/2014 2219      Review of Systems Past Medical History  Diagnosis Date  . Osteoarthritis   . Hep C w/o coma, chronic   . Emphysema   . GERD (gastroesophageal reflux disease)   . IBS (irritable  bowel syndrome)   . Psoriasis   . MRSA carrier   . Fibromyalgia   . Shortness of breath     with exertion  . Hypertension   . Pneumonia     As a child  . Anxiety   . Kidney stones     Past Surgical History  Procedure Laterality Date  . Hysteroscopy    . Appendectomy    . Abdominal hysterectomy    . Diagnostic laparoscopy      exploratory lap  . Colonoscopy w/ biopsies and polypectomy    . Direct laryngoscopy N/A 03/22/2014    Procedure: DIRECT LARYNGOSCOPY;  Surgeon: Ruby Cola, MD;  Location: [facility] OR;  Service: ENT;  Laterality: N/A;  With biopsy    Allergies  Allergen Reactions  . Codeine Itching  . Hydrocodone-Acetaminophen Itching  . Meloxicam Swelling    Face swelling  . Montelukast Cough    Current Outpatient Prescriptions on File Prior to Visit  Medication Sig Dispense Refill  . albuterol (PROVENTIL HFA;VENTOLIN HFA) 108 (90 BASE) MCG/ACT inhaler Inhale 2 puffs into the lungs every 4 (four) hours as needed for wheezing.  1 Inhaler  3  . albuterol (PROVENTIL) (2.5 MG/3ML) 0.083% nebulizer solution Take 2.5 mg by nebulization every 6 (six) hours as needed for wheezing or shortness of breath.      . dicyclomine (BENTYL) 20 MG tablet Take 20 mg by mouth every 6 (six) hours.      . furosemide (LASIX) 40 MG tablet Take 1 tablet (40 mg total) by mouth 2 (two) times daily.  60 tablet  3  . Multiple Vitamin (MULTIVITAMIN WITH MINERALS) TABS tablet Take 1 tablet by mouth daily.      Marland Kitchen omeprazole (PRILOSEC) 40 MG capsule Take 1 capsule (40 mg total) by mouth daily.  30 capsule  3  . oxyCODONE-acetaminophen (PERCOCET/ROXICET) 5-325 MG per tablet Take 1 tablet by mouth 2 (two) times daily as needed (pain).       Marland Kitchen spironolactone (ALDACTONE) 25 MG tablet Take 1 tablet (25 mg total) by mouth daily.  60 tablet  0  . lactulose (CHRONULAC) 10 GM/15ML solution Take 15 mLs (10 g total) by mouth 2 (two) times daily.  240 mL  0   No current facility-administered medications on file  prior to visit.        Objective:   Physical Exam  Filed Vitals:   05/29/14 1104  BP: 124/60  Pulse: 80  Temp: 97.6 F (36.4 C)  Height: 5\' 2"  (1.575 m)  Weight: 135 lb 11.2 oz (61.553 kg)  Alert and oriented. Skin warm and dry. Oral mucosa is moist.   . Sclera icteric, conjunctivae is pink. Thyroid not enlarged. No cervical lymphadenopathy. Lungs clear. Heart regular rate and rhythm.  Abdomen is soft. Bowel sounds are positive. No hepatomegaly. No abdominal masses felt. Slight tenderness to epigastric region. ? Hernia just  above the umblicus. No edema to lower extremities.          Assessment & Plan:  Cirrhosis alcoholic with Hepatitis C. Patient continues to drink. Thrombocytopenia probably related to her liver disease. ? Hernia just above the umbilicus.  Patient was advised that she needed to quit drinking.  CBC, AFP, PT/INR, Hep C Antibody, Hep C quaint, Hep C genotype. Continue present medications. OV in 2 months. Stop the [facility] powder's

## 2014-05-30 LAB — HEPATITIS C RNA QUANTITATIVE
HCV QUANT LOG: 5.61 {Log} — AB (ref <1.18)
HCV QUANT: 405060 [IU]/mL — AB (ref <15)

## 2014-05-30 LAB — PROTIME-INR
INR: 1.58 — AB (ref <1.50)
Prothrombin Time: 18.9 seconds — ABNORMAL HIGH (ref 11.6–15.2)

## 2014-05-30 LAB — HEPATITIS C ANTIBODY: HCV AB: REACTIVE — AB

## 2014-05-30 LAB — AFP TUMOR MARKER: AFP-Tumor Marker: 6.7 ng/mL — ABNORMAL HIGH (ref <6.1)

## 2014-06-01 LAB — HEPATITIS C GENOTYPE: HCV Genotype: 2

## 2014-06-05 ENCOUNTER — Ambulatory Visit (HOSPITAL_COMMUNITY): Payer: Medicaid Other

## 2014-06-09 ENCOUNTER — Telehealth (INDEPENDENT_AMBULATORY_CARE_PROVIDER_SITE_OTHER): Payer: Self-pay | Admitting: Internal Medicine

## 2014-06-09 ENCOUNTER — Ambulatory Visit (HOSPITAL_COMMUNITY): Admission: RE | Admit: 2014-06-09 | Payer: Medicaid Other | Source: Ambulatory Visit

## 2014-06-09 DIAGNOSIS — B1921 Unspecified viral hepatitis C with hepatic coma: Secondary | ICD-10-CM

## 2014-06-09 DIAGNOSIS — K7031 Alcoholic cirrhosis of liver with ascites: Secondary | ICD-10-CM

## 2014-06-09 NOTE — Telephone Encounter (Signed)
Result of her lab given to patient. Am going to get an hepatic function on her.

## 2014-06-13 ENCOUNTER — Encounter (HOSPITAL_COMMUNITY): Payer: Self-pay

## 2014-06-13 ENCOUNTER — Ambulatory Visit (HOSPITAL_COMMUNITY)
Admission: RE | Admit: 2014-06-13 | Discharge: 2014-06-13 | Disposition: A | Discharge status: Home / Self Care | Payer: Medicaid Other | Source: Ambulatory Visit | Attending: Internal Medicine | Admitting: Internal Medicine

## 2014-06-13 ENCOUNTER — Ambulatory Visit (INDEPENDENT_AMBULATORY_CARE_PROVIDER_SITE_OTHER): Payer: Medicaid Other | Admitting: Internal Medicine

## 2014-06-13 DIAGNOSIS — K766 Portal hypertension: Secondary | ICD-10-CM | POA: Diagnosis not present

## 2014-06-13 DIAGNOSIS — R140 Abdominal distension (gaseous): Secondary | ICD-10-CM | POA: Insufficient documentation

## 2014-06-13 DIAGNOSIS — B1921 Unspecified viral hepatitis C with hepatic coma: Secondary | ICD-10-CM | POA: Diagnosis not present

## 2014-06-13 DIAGNOSIS — I864 Gastric varices: Secondary | ICD-10-CM | POA: Diagnosis not present

## 2014-06-13 DIAGNOSIS — K7469 Other cirrhosis of liver: Secondary | ICD-10-CM | POA: Insufficient documentation

## 2014-06-13 DIAGNOSIS — I8510 Secondary esophageal varices without bleeding: Secondary | ICD-10-CM | POA: Insufficient documentation

## 2014-06-13 MED ORDER — IOHEXOL 300 MG/ML  SOLN
100.0000 mL | Freq: Once | INTRAMUSCULAR | Status: AC | PRN
  Administered 2014-06-13: 100 mL via INTRAVENOUS

## 2014-06-14 ENCOUNTER — Telehealth (INDEPENDENT_AMBULATORY_CARE_PROVIDER_SITE_OTHER): Payer: Self-pay | Admitting: Internal Medicine

## 2014-06-14 NOTE — Telephone Encounter (Signed)
OV in 2 months.  Butch Penny, OV in 2 months

## 2014-06-15 ENCOUNTER — Telehealth (INDEPENDENT_AMBULATORY_CARE_PROVIDER_SITE_OTHER): Payer: Self-pay | Admitting: Internal Medicine

## 2014-06-15 ENCOUNTER — Telehealth: Payer: Self-pay

## 2014-06-15 ENCOUNTER — Telehealth: Payer: Self-pay | Admitting: Family

## 2014-06-15 ENCOUNTER — Other Ambulatory Visit (INDEPENDENT_AMBULATORY_CARE_PROVIDER_SITE_OTHER): Payer: Self-pay | Admitting: *Deleted

## 2014-06-15 ENCOUNTER — Other Ambulatory Visit: Payer: Self-pay

## 2014-06-15 DIAGNOSIS — K7469 Other cirrhosis of liver: Secondary | ICD-10-CM

## 2014-06-15 DIAGNOSIS — K429 Umbilical hernia without obstruction or gangrene: Secondary | ICD-10-CM

## 2014-06-15 DIAGNOSIS — B1920 Unspecified viral hepatitis C without hepatic coma: Secondary | ICD-10-CM

## 2014-06-15 DIAGNOSIS — R110 Nausea: Secondary | ICD-10-CM

## 2014-06-15 MED ORDER — LACTULOSE 10 GM/15ML PO SOLN: 10.0000 g | Freq: Two times a day (BID) | ORAL | Status: DC

## 2014-06-15 NOTE — Telephone Encounter (Signed)
Please call me

## 2014-06-15 NOTE — Telephone Encounter (Signed)
Needs to go to [facility] for referral for surgical consult. (Per Lacretia Nicks). I have spoke with patient. Needs to go PCP at least by next week for the referral.

## 2014-06-15 NOTE — Telephone Encounter (Signed)
Referral to Dr. Arnoldo Morale for hernia.

## 2014-06-15 NOTE — Telephone Encounter (Signed)
Apt has been scheduled for 08/14/14 with Deberah Castle, NP.

## 2014-06-15 NOTE — Telephone Encounter (Signed)
What does patient want?

## 2014-06-16 ENCOUNTER — Other Ambulatory Visit: Payer: Self-pay | Admitting: *Deleted

## 2014-06-16 ENCOUNTER — Telehealth: Payer: Self-pay

## 2014-06-16 ENCOUNTER — Telehealth: Payer: Self-pay | Admitting: *Deleted

## 2014-06-16 DIAGNOSIS — K460 Unspecified abdominal hernia with obstruction, without gangrene: Secondary | ICD-10-CM

## 2014-06-16 NOTE — Telephone Encounter (Signed)
Patient has several medical problems and is facing some upcoming surgeries. Was told that she has to stop drinking and she states that she has been drinking for several years. Wants to know if you will send in some vistaril to help her with the anxiety and shakes that she is having. Please advise

## 2014-06-16 NOTE — Telephone Encounter (Signed)
Patient wants something called in for her nerves per her aid who takes care of her.

## 2014-06-19 ENCOUNTER — Telehealth (INDEPENDENT_AMBULATORY_CARE_PROVIDER_SITE_OTHER): Payer: Self-pay | Admitting: *Deleted

## 2014-06-19 NOTE — Telephone Encounter (Signed)
noted 

## 2014-06-19 NOTE — Telephone Encounter (Signed)
Need colonoscopy report from [facility]

## 2014-06-19 NOTE — Telephone Encounter (Signed)
Patient aware,no medications to aid with getting off alcohol.  She will be following up with Dr. Arnoldo Morale and then with [facility] for a referral for surgery for hernia.

## 2014-06-19 NOTE — Telephone Encounter (Signed)
This is just FYI since we requested surgical referral -- Claiborne Billings from Dr Arnoldo Morale office called about patients referral to them, Dr Arnoldo Morale has reviewed records and due to patient's advanced stage of cirrhosis he feels patient needs appt with a surgeon [place] has called patient's PCP and advised them of this since they will need to handle referral since patient has [employer]

## 2014-06-19 NOTE — Telephone Encounter (Signed)
Needs to be detoxed at hospital or behavioral health.

## 2014-06-21 NOTE — Telephone Encounter (Signed)
Faxed a release for the records.

## 2014-06-26 ENCOUNTER — Other Ambulatory Visit: Payer: Self-pay | Admitting: *Deleted

## 2014-06-26 DIAGNOSIS — K429 Umbilical hernia without obstruction or gangrene: Secondary | ICD-10-CM

## 2014-07-04 ENCOUNTER — Telehealth: Payer: Self-pay | Admitting: Nurse Practitioner

## 2014-07-04 MED ORDER — PROMETHAZINE HCL 12.5 MG PO TABS: 12.5000 mg | ORAL_TABLET | Freq: Three times a day (TID) | ORAL | Status: DC | PRN

## 2014-07-04 NOTE — Telephone Encounter (Signed)
pheneran rx sent to pharmacy

## 2014-07-04 NOTE — Telephone Encounter (Signed)
Pt wants rx for phenergan but I don't see it on our med list.

## 2014-07-11 ENCOUNTER — Encounter (HOSPITAL_COMMUNITY)
Admission: RE | Disposition: A | Discharge status: Home / Self Care | Payer: Self-pay | Source: Ambulatory Visit | Attending: Internal Medicine

## 2014-07-11 ENCOUNTER — Other Ambulatory Visit (INDEPENDENT_AMBULATORY_CARE_PROVIDER_SITE_OTHER): Payer: Self-pay | Admitting: *Deleted

## 2014-07-11 ENCOUNTER — Ambulatory Visit (HOSPITAL_COMMUNITY)
Admission: RE | Admit: 2014-07-11 | Discharge: 2014-07-11 | Disposition: A | Discharge status: Home / Self Care | Payer: Medicaid Other | Source: Ambulatory Visit | Attending: Internal Medicine | Admitting: Internal Medicine

## 2014-07-11 ENCOUNTER — Encounter (HOSPITAL_COMMUNITY): Payer: Self-pay | Admitting: *Deleted

## 2014-07-11 DIAGNOSIS — B1920 Unspecified viral hepatitis C without hepatic coma: Secondary | ICD-10-CM

## 2014-07-11 DIAGNOSIS — K7469 Other cirrhosis of liver: Secondary | ICD-10-CM

## 2014-07-11 DIAGNOSIS — Z538 Procedure and treatment not carried out for other reasons: Secondary | ICD-10-CM | POA: Insufficient documentation

## 2014-07-11 DIAGNOSIS — R110 Nausea: Secondary | ICD-10-CM

## 2014-07-11 LAB — HEMOGLOBIN AND HEMATOCRIT, BLOOD
HEMATOCRIT: 34.3 % — AB (ref 36.0–46.0)
Hemoglobin: 11.6 g/dL — ABNORMAL LOW (ref 12.0–15.0)

## 2014-07-11 LAB — BASIC METABOLIC PANEL
ANION GAP: 9 (ref 5–15)
BUN: 7 mg/dL (ref 6–23)
CHLORIDE: 108 meq/L (ref 96–112)
CO2: 24 mEq/L (ref 19–32)
Calcium: 8.7 mg/dL (ref 8.4–10.5)
Creatinine, Ser: 0.6 mg/dL (ref 0.50–1.10)
GFR calc Af Amer: >90 mL/min (ref >90)
GFR calc non Af Amer: >90 mL/min (ref >90)
Glucose, Bld: 97 mg/dL (ref 70–99)
Potassium: 4 mEq/L (ref 3.7–5.3)
Sodium: 141 mEq/L (ref 137–147)

## 2014-07-11 SURGERY — CANCELLED PROCEDURE

## 2014-07-11 MED ORDER — B-COMPLEX PO TABS: 1.0000 | ORAL_TABLET | Freq: Every day | ORAL | Status: DC

## 2014-07-11 MED ORDER — FUROSEMIDE 40 MG PO TABS: 40.0000 mg | ORAL_TABLET | Freq: Every day | ORAL | Status: DC

## 2014-07-11 MED ORDER — OXYCODONE HCL 5 MG PO TABA: 5.0000 mg | ORAL_TABLET | Freq: Three times a day (TID) | ORAL | Status: DC | PRN

## 2014-07-11 MED ORDER — SODIUM CHLORIDE 0.9 % IV SOLN
INTRAVENOUS | Status: DC
  Administered 2014-07-11: 07:00:00 via INTRAVENOUS

## 2014-07-11 MED ORDER — MIDAZOLAM HCL 5 MG/5ML IJ SOLN
INTRAMUSCULAR | Status: AC
  Filled 2014-07-11: qty 5

## 2014-07-11 MED ORDER — LORAZEPAM 1 MG PO TABS: 1.0000 mg | ORAL_TABLET | Freq: Three times a day (TID) | ORAL | Status: DC

## 2014-07-11 MED ORDER — MEPERIDINE HCL 50 MG/ML IJ SOLN
INTRAMUSCULAR | Status: AC
  Filled 2014-07-11: qty 1

## 2014-07-11 MED ORDER — MIDAZOLAM HCL 5 MG/5ML IJ SOLN
INTRAMUSCULAR | Status: AC
  Filled 2014-07-11: qty 10

## 2014-07-11 MED ORDER — ONDANSETRON 4 MG PO FILM: 4.0000 mg | ORAL_FILM | Freq: Two times a day (BID) | ORAL | Status: DC | PRN

## 2014-07-11 NOTE — H&P (Signed)
  Patient has cirrhosis secondary to hepatitis C and excessive alcohol use. Recent CT suggested oral hypertension and varices. Patient has COPD. She also takes oxycodone and continues drink alcohol. I am concerned that patient will not tolerate conscious sedation and procedure needs to be done with propofol.  Therefore will cancel EGD today and reschedule with propofol . Patient given prescription for Zofran and lorazepam. Patient advised to discontinue her omeprazole in and she must not drink alcohol anymore.  Patient advised to go to emergency room if lorazepam does not control her withdrawal symptoms.

## 2014-07-11 NOTE — Discharge Instructions (Signed)
Do not take promethazine or Percocet. Take oxycodone 5 mg up to 3 times a day as needed for pain. Please ask your rheumatologist to give you pain medication without Tylenol. Ondansetron 4 mg twice daily as needed for nausea and vomiting. Lorazepam 1 mg by mouth every 6 hours for 1 week and then drop it to 1 mg every 8 hours. Do not take aspirin or other OTC NSAIDs. Take B complex tablet 1 daily(with folic acid and thiamine0. Esophagogastroduodenoscopy and possible variceal banding to be scheduled with propofol.

## 2014-07-11 NOTE — Progress Notes (Signed)
Dr Laural Golden cancelled Procedure for today.  Pt to come back for EGD with possible Esophageal banding under propofol/MAC anesthesia.  Rosalyn Gess RN, BSN, CNOR, [employer]

## 2014-07-19 ENCOUNTER — Encounter (HOSPITAL_COMMUNITY): Admission: RE | Admit: 2014-07-19 | Payer: Medicaid Other | Source: Ambulatory Visit

## 2014-07-20 ENCOUNTER — Encounter (HOSPITAL_COMMUNITY): Admission: RE | Payer: Self-pay | Source: Ambulatory Visit

## 2014-07-20 ENCOUNTER — Ambulatory Visit (HOSPITAL_COMMUNITY): Admission: RE | Admit: 2014-07-20 | Payer: Medicaid Other | Source: Ambulatory Visit | Admitting: Internal Medicine

## 2014-07-20 ENCOUNTER — Other Ambulatory Visit (INDEPENDENT_AMBULATORY_CARE_PROVIDER_SITE_OTHER): Payer: Self-pay | Admitting: *Deleted

## 2014-07-20 DIAGNOSIS — K7469 Other cirrhosis of liver: Secondary | ICD-10-CM

## 2014-07-20 SURGERY — ESOPHAGOGASTRODUODENOSCOPY (EGD) WITH PROPOFOL
Anesthesia: Monitor Anesthesia Care

## 2014-07-20 MED ORDER — LIDOCAINE HCL (PF) 1 % IJ SOLN
INTRAMUSCULAR | Status: AC
  Filled 2014-07-20: qty 2

## 2014-07-20 NOTE — OR Nursing (Signed)
Patient understands to call office  And reschedule procedure to next week. Per Dr. Laural Golden order.

## 2014-07-20 NOTE — OR Nursing (Signed)
Patient came in this am with lots of issues going on,  Forgot amd ate a hamburger at 0430 in the am and drank a drank.  Anxious and sob,  Coughing up brownish  Phlegm.  Doctor Rehman notified by preop nurse this am.  Case cancelled for today and will be rescheduled.

## 2014-07-24 ENCOUNTER — Encounter (HOSPITAL_COMMUNITY): Payer: Self-pay

## 2014-07-24 ENCOUNTER — Encounter (HOSPITAL_COMMUNITY)
Admission: RE | Admit: 2014-07-24 | Discharge: 2014-07-24 | Disposition: A | Discharge status: Home / Self Care | Payer: Medicaid Other | Source: Ambulatory Visit | Attending: Internal Medicine | Admitting: Internal Medicine

## 2014-07-26 ENCOUNTER — Ambulatory Visit (HOSPITAL_COMMUNITY): Payer: Medicaid Other | Admitting: Anesthesiology

## 2014-07-26 ENCOUNTER — Encounter (INDEPENDENT_AMBULATORY_CARE_PROVIDER_SITE_OTHER): Payer: Self-pay | Admitting: *Deleted

## 2014-07-26 ENCOUNTER — Encounter (HOSPITAL_COMMUNITY)
Admission: RE | Disposition: A | Discharge status: Home / Self Care | Payer: Self-pay | Source: Ambulatory Visit | Attending: Internal Medicine

## 2014-07-26 ENCOUNTER — Encounter (INDEPENDENT_AMBULATORY_CARE_PROVIDER_SITE_OTHER): Payer: Self-pay

## 2014-07-26 ENCOUNTER — Encounter (HOSPITAL_COMMUNITY): Payer: Self-pay | Admitting: *Deleted

## 2014-07-26 ENCOUNTER — Ambulatory Visit (HOSPITAL_COMMUNITY)
Admission: RE | Admit: 2014-07-26 | Discharge: 2014-07-26 | Disposition: A | Discharge status: Home / Self Care | Payer: Medicaid Other | Source: Ambulatory Visit | Attending: Internal Medicine | Admitting: Internal Medicine

## 2014-07-26 DIAGNOSIS — K7460 Unspecified cirrhosis of liver: Secondary | ICD-10-CM | POA: Insufficient documentation

## 2014-07-26 DIAGNOSIS — K7469 Other cirrhosis of liver: Secondary | ICD-10-CM

## 2014-07-26 DIAGNOSIS — I10 Essential (primary) hypertension: Secondary | ICD-10-CM | POA: Insufficient documentation

## 2014-07-26 DIAGNOSIS — F1010 Alcohol abuse, uncomplicated: Secondary | ICD-10-CM | POA: Diagnosis not present

## 2014-07-26 DIAGNOSIS — J449 Chronic obstructive pulmonary disease, unspecified: Secondary | ICD-10-CM | POA: Insufficient documentation

## 2014-07-26 DIAGNOSIS — K3189 Other diseases of stomach and duodenum: Secondary | ICD-10-CM | POA: Insufficient documentation

## 2014-07-26 DIAGNOSIS — K589 Irritable bowel syndrome without diarrhea: Secondary | ICD-10-CM | POA: Insufficient documentation

## 2014-07-26 DIAGNOSIS — M1990 Unspecified osteoarthritis, unspecified site: Secondary | ICD-10-CM | POA: Diagnosis not present

## 2014-07-26 DIAGNOSIS — B182 Chronic viral hepatitis C: Secondary | ICD-10-CM | POA: Insufficient documentation

## 2014-07-26 DIAGNOSIS — Z79899 Other long term (current) drug therapy: Secondary | ICD-10-CM | POA: Diagnosis not present

## 2014-07-26 DIAGNOSIS — Z87442 Personal history of urinary calculi: Secondary | ICD-10-CM | POA: Diagnosis not present

## 2014-07-26 DIAGNOSIS — L539 Erythematous condition, unspecified: Secondary | ICD-10-CM | POA: Insufficient documentation

## 2014-07-26 DIAGNOSIS — F17210 Nicotine dependence, cigarettes, uncomplicated: Secondary | ICD-10-CM | POA: Insufficient documentation

## 2014-07-26 DIAGNOSIS — I8500 Esophageal varices without bleeding: Secondary | ICD-10-CM | POA: Diagnosis not present

## 2014-07-26 DIAGNOSIS — K210 Gastro-esophageal reflux disease with esophagitis: Secondary | ICD-10-CM | POA: Diagnosis not present

## 2014-07-26 DIAGNOSIS — M797 Fibromyalgia: Secondary | ICD-10-CM | POA: Diagnosis not present

## 2014-07-26 DIAGNOSIS — K2210 Ulcer of esophagus without bleeding: Secondary | ICD-10-CM

## 2014-07-26 DIAGNOSIS — K766 Portal hypertension: Secondary | ICD-10-CM

## 2014-07-26 HISTORY — PX: ESOPHAGOGASTRODUODENOSCOPY (EGD) WITH PROPOFOL: SHX5813

## 2014-07-26 SURGERY — ESOPHAGOGASTRODUODENOSCOPY (EGD) WITH PROPOFOL
Anesthesia: Monitor Anesthesia Care

## 2014-07-26 MED ORDER — FENTANYL CITRATE 0.05 MG/ML IJ SOLN
25.0000 ug | INTRAMUSCULAR | Status: AC
  Administered 2014-07-26 (×2): 25 ug via INTRAVENOUS

## 2014-07-26 MED ORDER — BUTAMBEN-TETRACAINE-BENZOCAINE 2-2-14 % EX AERO
2.0000 | INHALATION_SPRAY | Freq: Once | CUTANEOUS | Status: AC
  Administered 2014-07-26: 2 via TOPICAL
  Filled 2014-07-26: qty 56

## 2014-07-26 MED ORDER — ONDANSETRON HCL 4 MG/2ML IJ SOLN
INTRAMUSCULAR | Status: AC
  Filled 2014-07-26: qty 2

## 2014-07-26 MED ORDER — LIDOCAINE HCL (PF) 1 % IJ SOLN
INTRAMUSCULAR | Status: AC
  Filled 2014-07-26: qty 5

## 2014-07-26 MED ORDER — FENTANYL CITRATE 0.05 MG/ML IJ SOLN
INTRAMUSCULAR | Status: AC
  Filled 2014-07-26: qty 2

## 2014-07-26 MED ORDER — MIDAZOLAM HCL 2 MG/2ML IJ SOLN
1.0000 mg | INTRAMUSCULAR | Status: DC | PRN
  Administered 2014-07-26: 2 mg via INTRAVENOUS

## 2014-07-26 MED ORDER — LIDOCAINE HCL (CARDIAC) 10 MG/ML IV SOLN
INTRAVENOUS | Status: DC | PRN
  Administered 2014-07-26: 40 mg via INTRAVENOUS

## 2014-07-26 MED ORDER — LORAZEPAM 1 MG PO TABS: 1.0000 mg | ORAL_TABLET | Freq: Three times a day (TID) | ORAL | Status: DC | PRN

## 2014-07-26 MED ORDER — PROPOFOL 10 MG/ML IV BOLUS
INTRAVENOUS | Status: AC
  Filled 2014-07-26: qty 20

## 2014-07-26 MED ORDER — FENTANYL CITRATE 0.05 MG/ML IJ SOLN
INTRAMUSCULAR | Status: DC | PRN
  Administered 2014-07-26 (×3): 25 ug via INTRAVENOUS

## 2014-07-26 MED ORDER — PROPOFOL INFUSION 10 MG/ML OPTIME
INTRAVENOUS | Status: DC | PRN
  Administered 2014-07-26: 175 ug/kg/min via INTRAVENOUS

## 2014-07-26 MED ORDER — ONDANSETRON HCL 4 MG/2ML IJ SOLN: 4.0000 mg | Freq: Once | INTRAMUSCULAR | Status: DC | PRN

## 2014-07-26 MED ORDER — LACTATED RINGERS IV SOLN
INTRAVENOUS | Status: DC
  Administered 2014-07-26: 09:00:00 via INTRAVENOUS

## 2014-07-26 MED ORDER — FENTANYL CITRATE 0.05 MG/ML IJ SOLN: 25.0000 ug | INTRAMUSCULAR | Status: DC | PRN

## 2014-07-26 MED ORDER — MIDAZOLAM HCL 2 MG/2ML IJ SOLN
INTRAMUSCULAR | Status: AC
  Filled 2014-07-26: qty 2

## 2014-07-26 MED ORDER — PROPOFOL 10 MG/ML IV BOLUS
INTRAVENOUS | Status: DC | PRN
  Administered 2014-07-26 (×3): 6.5 mg via INTRAVENOUS

## 2014-07-26 MED ORDER — STERILE WATER FOR IRRIGATION IR SOLN
Status: DC | PRN
  Administered 2014-07-26: 1000 mL

## 2014-07-26 MED ORDER — ONDANSETRON HCL 4 MG/2ML IJ SOLN
4.0000 mg | Freq: Once | INTRAMUSCULAR | Status: AC
  Administered 2014-07-26: 4 mg via INTRAVENOUS

## 2014-07-26 SURGICAL SUPPLY — 22 items
BLOCK BITE 60FR ADLT L/F BLUE (MISCELLANEOUS) ×3 IMPLANT
ELECT REM PT RETURN 9FT ADLT (ELECTROSURGICAL)
ELECTRODE REM PT RTRN 9FT ADLT (ELECTROSURGICAL) IMPLANT
FLOOR PAD 36X40 (MISCELLANEOUS) ×3
FORCEP COLD BIOPSY (CUTTING FORCEPS) IMPLANT
FORCEP RJ3 GP 1.8X160 W-NEEDLE (CUTTING FORCEPS) IMPLANT
FORCEPS BIOP RAD 4 LRG CAP 4 (CUTTING FORCEPS) IMPLANT
FORMALIN 10 PREFIL 20ML (MISCELLANEOUS) ×3 IMPLANT
KIT CLEAN ENDO COMPLIANCE (KITS) ×3 IMPLANT
MANIFOLD NEPTUNE II (INSTRUMENTS) ×3 IMPLANT
MANIFOLD NEPTUNE WASTE (CANNULA) ×3 IMPLANT
NEEDLE SCLEROTHERAPY 25GX240 (NEEDLE) IMPLANT
PAD FLOOR 36X40 (MISCELLANEOUS) ×1 IMPLANT
PROBE APC STR FIRE (PROBE) IMPLANT
PROBE INJECTION GOLD (MISCELLANEOUS)
PROBE INJECTION GOLD 7FR (MISCELLANEOUS) IMPLANT
SNARE ROTATE MED OVAL 20MM (MISCELLANEOUS) IMPLANT
SNARE SHORT THROW 13M SML OVAL (MISCELLANEOUS) ×3 IMPLANT
SYR 50ML LL SCALE MARK (SYRINGE) ×3 IMPLANT
SYR INFLATION 60ML (SYRINGE) IMPLANT
TUBING IRRIGATION ENDOGATOR (MISCELLANEOUS) ×3 IMPLANT
WATER STERILE IRR 1000ML POUR (IV SOLUTION) ×3 IMPLANT

## 2014-07-26 NOTE — Progress Notes (Signed)
This note also relates to the following rows which could not be included: Temp - Cannot attach notes to unvalidated device data ECG Heart Rate - Cannot attach notes to unvalidated device data Resp - Cannot attach notes to unvalidated device data BP - Cannot attach notes to unvalidated device data SpO2 - Cannot attach notes to unvalidated device data   Charted at wrong time

## 2014-07-26 NOTE — Op Note (Signed)
EGD PROCEDURE REPORT  PATIENT:  Kristen Marsh  MR#:  [id number] Birthdate:  [birth year], 58 y.o., female Endoscopist:  Dr. Rogene Houston, MD Referred By:  Dr. Redge Gainer, MD  Procedure Date: 07/26/2014  Procedure:   EGD  Indications:  Patient is 28 old Caucasian female was advanced cirrhosis secondary to chronic hepatitis C and excessive alcohol use. Recent CT suggested esophageal and gastric varices. She is undergoing EGD with therapeutic intervention. Patient has not drank any alcohol in 2 weeks. She states lorazepam has helped her nerves.            Informed Consent:  The risks, benefits, alternatives & imponderables which include, but are not limited to, bleeding, infection, perforation, drug reaction and potential missed lesion have been reviewed.  The potential for biopsy, lesion removal, esophageal dilation, etc. have also been discussed.  Questions have been answered.  All parties agreeable.  Please see history & physical in medical record for more information.  Medications:  Monitored anesthesia care. Cetacaine spray topically for oropharyngeal anesthesia  Description of procedure:  The endoscope was introduced through the mouth and advanced to the second portion of the duodenum without difficulty or limitations. The mucosal surfaces were surveyed very carefully during advancement of the scope and upon withdrawal.  Findings:  Esophagus:  Mucosa of the proximal and middle segment was normal. Distally there were 2 columns of esophageal varices. One was grade 2 and the other one was grade 1. Single erosion noted at GE junction. GEJ:  37 cm Stomach:  Stomach was empty and distended very well with insufflation. Folds in the proximal stomach were normal. Examination of mucosa revealed more or less diffuse changes of Mohs a pattern and snake skinning and erythema. No erosions or ulcers are noted. Pyloric channel was patent. Fundus and cardia were examined by retroflex the scope and no  varices noted. Duodenum:  Normal bulbar and post bulbar mucosa.  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 Erosive reflux esophagitis. Portal gastropathy.  Recommendations:  Continue anti-reflux measures and omeprazole at 40 mg by mouth every morning. Office visit in 4 weeks. She will try to reduce lorazepam dose from 3 mg per day to 2 mg daily. Patient will have CBC, comprehensive chemistry panel and INR prior to her visit.  REHMAN,NAJEEB U  07/26/2014  10:53 AM  CC: Dr. Redge Gainer, MD & Dr. Rayne Du ref. provider found

## 2014-07-26 NOTE — Discharge Instructions (Signed)
Resume usual medications. Can decrease her lorazepam dose to 0.5 mg twice daily and 1 mg at bedtime. Resume usual diet. No driving for 24 hours. Office visit in 4 weeks.   Esophagogastroduodenoscopy Care After Refer to this sheet in the next few weeks. These instructions provide you with information on caring for yourself after your procedure. Your caregiver may also give you more specific instructions. Your treatment has been planned according to current medical practices, but problems sometimes occur. Call your caregiver if you have any problems or questions after your procedure.  HOME CARE INSTRUCTIONS  Do not eat or drink anything until the numbing medicine (local anesthetic) has worn off and your gag reflex has returned. You will know that the local anesthetic has worn off when you can swallow comfortably.  Do not drive for 12 hours after the procedure or as directed by your caregiver.  Only take medicines as directed by your caregiver. SEEK MEDICAL CARE IF:   You cannot stop coughing.  You are not urinating at all or less than usual. SEEK IMMEDIATE MEDICAL CARE IF:  You have difficulty swallowing.  You cannot eat or drink.  You have worsening throat or chest pain.  You have dizziness, lightheadedness, or you faint.  You have nausea or vomiting.  You have chills.  You have a fever.  You have severe abdominal pain.  You have black, tarry, or bloody stools. Document Released: 07/07/2012 Document Reviewed: 07/07/2012 [facility] Patient Information 2015 [place]. This information is not intended to replace advice given to you by your health care provider. Make sure you discuss any questions you have with your health care provider.

## 2014-07-26 NOTE — Anesthesia Procedure Notes (Signed)
Procedure Name: MAC Date/Time: 07/26/2014 10:14 AM Performed by: Vista Deck Pre-anesthesia Checklist: Patient identified, Emergency Drugs available, Suction available, Timeout performed and Patient being monitored Patient Re-evaluated:Patient Re-evaluated prior to inductionOxygen Delivery Method: Non-rebreather mask

## 2014-07-26 NOTE — Anesthesia Postprocedure Evaluation (Signed)
  Anesthesia Post-op Note  Patient: Kristen Marsh  Procedure(s) Performed: Procedure(s): ESOPHAGOGASTRODUODENOSCOPY (EGD) WITH PROPOFOL (N/A)  Patient Location: PACU  Anesthesia Type:MAC  Level of Consciousness: awake and alert   Airway and Oxygen Therapy: Patient Spontanous Breathing  Post-op Pain: mild  Post-op Assessment: Post-op Vital signs reviewed, Patient's Cardiovascular Status Stable and Respiratory Function Stable  Post-op Vital Signs: Reviewed and stable  Complications: No apparent anesthesia complications

## 2014-07-26 NOTE — H&P (Signed)
Kristen Marsh is an 58 y.o. female.   Chief Complaint: Patient's here for EGD and possible esophageal variceal banding. 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 She is undergoing EGD with intention to band esophageal varices if they're deemed to be large. Patient was initially scheduled 2 weeks ago for procedure postponed because I felt she would not tolerate procedure with conscious sedation. She returned for the second time last week and eating half a hamburger in the morning. There is no history of melena or rectal bleeding. Patient states she has not had any alcohol in 2 weeks. She feels lorazepam is helping her with her nerves.  Past Medical History  Diagnosis Date  . Osteoarthritis   . Hep C w/o coma, chronic   . Emphysema   . GERD (gastroesophageal reflux disease)   . IBS (irritable bowel syndrome)   . Psoriasis   . MRSA carrier   . Fibromyalgia   . Shortness of breath     with exertion  . Hypertension   . Pneumonia     As a child  . Anxiety   . Kidney stones     Past Surgical History  Procedure Laterality Date  . Hysteroscopy    . Appendectomy    . Abdominal hysterectomy    . Diagnostic laparoscopy      exploratory lap  . Colonoscopy w/ biopsies and polypectomy    . Direct laryngoscopy N/A 03/22/2014    Procedure: DIRECT LARYNGOSCOPY;  Surgeon: Ruby Cola, MD;  Location: [facility] OR;  Service: ENT;  Laterality: N/A;  With biopsy    Family History  Problem Relation Age of Onset  . Cancer Mother     esophageal  . Cancer Brother     Liver cancer and cirrhosis age 82   Social History:  reports that she has been smoking Cigarettes.  She has a 20 pack-year smoking history. She has never used smokeless tobacco. She reports that she drinks about 2.4 oz of alcohol per week. She reports that she uses illicit drugs (Cocaine and  "Crack" cocaine).  Allergies:  Allergies  Allergen Reactions  . Codeine Itching  . Hydrocodone-Acetaminophen Itching  . Meloxicam Swelling    Face swelling  . Montelukast Cough    Medications Prior to Admission  Medication Sig Dispense Refill  . albuterol (PROVENTIL HFA;VENTOLIN HFA) 108 (90 BASE) MCG/ACT inhaler Inhale 2 puffs into the lungs every 4 (four) hours as needed for wheezing. 1 Inhaler 3  . albuterol (PROVENTIL) (2.5 MG/3ML) 0.083% nebulizer solution Take 2.5 mg by nebulization every 6 (six) hours as needed for wheezing or shortness of breath.    . B-Complex TABS Take 1 tablet by mouth daily.  0  . dicyclomine (BENTYL) 20 MG tablet Take 20 mg by mouth every 6 (six) hours.    . furosemide (LASIX) 40 MG tablet Take 1 tablet (40 mg total) by mouth daily. 30 tablet 2  . lactulose (CHRONULAC) 10 GM/15ML solution Take 15 mLs (10 g total) by mouth 2 (two) times daily. 240 mL 2  . LORazepam (ATIVAN) 1 MG tablet Take 1 tablet (1 mg total) by mouth every 8 (eight) hours. 42 tablet 1  . Multiple Vitamin (MULTIVITAMIN WITH MINERALS) TABS tablet Take 1 tablet by mouth daily.    Marland Kitchen omeprazole (PRILOSEC) 40 MG capsule Take 1 capsule (40 mg total) by mouth daily. 30 capsule 3  .  Ondansetron 4 MG FILM Take 4 mg by mouth 2 (two) times daily as needed. 20 each 1  . OxyCODONE HCl, Abuse Deter, 5 MG TABA Take 5 mg by mouth 3 (three) times daily as needed. 21 tablet 0  . spironolactone (ALDACTONE) 25 MG tablet Take 1 tablet (25 mg total) by mouth daily. (Patient not taking: Reported on 07/12/2014) 60 tablet 0    No results found for this or any previous visit (from the past 48 hour(s)). No results found.  ROS  Blood pressure 104/54, pulse 84, temperature 98.7 F (37.1 C), temperature source Oral, resp. rate 0, SpO2 100 %. Physical Exam  Constitutional: She appears well-developed and well-nourished.  HENT:  Mouth/Throat: Oropharynx is clear and moist.  Eyes: Conjunctivae are normal. No  scleral icterus.  Neck: No thyromegaly present.  Cardiovascular: Normal rate, regular rhythm and normal heart sounds.   Murmur: grade 2 over systolic ejection murmur best set at LLSB. Respiratory: Effort normal and breath sounds normal.  GI: Soft. She exhibits no distension and no mass. There is no tenderness.  Spleen tip is palpable.  Musculoskeletal: She exhibits no edema.  Lymphadenopathy:    She has no cervical adenopathy.  Neurological: She is alert.  Skin: Skin is warm and dry.  Multiple spider angiomata at lower neck and upper chest.     Assessment/Plan Cirrhosis secondary to chronic hepatitis C and excessive alcohol use. EGD with possible esophageal variceal banding.  Laurence Crofford U 07/26/2014, 10:07 AM

## 2014-07-26 NOTE — Anesthesia Preprocedure Evaluation (Addendum)
Anesthesia Evaluation  Patient identified by MRN, date of birth, ID band Patient awake    Reviewed: Allergy & Precautions, H&P , NPO status , Patient's Chart, lab work & pertinent test results  Airway Mallampati: I  TM Distance: >3 FB Neck ROM: Full    Dental  (+) Teeth Intact, Dental Advisory Given   Pulmonary shortness of breath, COPD (emphysema)Current Smoker,  breath sounds clear to auscultation        Cardiovascular hypertension, Pt. on medications Rhythm:Regular Rate:Normal     Neuro/Psych PSYCHIATRIC DISORDERS Anxiety    GI/Hepatic GERD-  Controlled and Medicated,(+) Cirrhosis -  Esophageal Varices  substance abuse  alcohol use, Hepatitis -, C  Endo/Other    Renal/GU      Musculoskeletal  (+) Fibromyalgia -  Abdominal   Peds  Hematology   Anesthesia Other Findings   Reproductive/Obstetrics                            Anesthesia Physical Anesthesia Plan  ASA: IV  Anesthesia Plan: MAC   Post-op Pain Management:    Induction: Intravenous  Airway Management Planned: Simple Face Mask  Additional Equipment:   Intra-op Plan:   Post-operative Plan:   Informed Consent: I have reviewed the patients History and Physical, chart, labs and discussed the procedure including the risks, benefits and alternatives for the proposed anesthesia with the patient or authorized representative who has indicated his/her understanding and acceptance.     Plan Discussed with:   Anesthesia Plan Comments:         Anesthesia Quick Evaluation

## 2014-07-26 NOTE — Transfer of Care (Signed)
Immediate Anesthesia Transfer of Care Note  Patient: Kristen Marsh  Procedure(s) Performed: Procedure(s): ESOPHAGOGASTRODUODENOSCOPY (EGD) WITH PROPOFOL (N/A)  Patient Location: PACU  Anesthesia Type:MAC  Level of Consciousness: awake  Airway & Oxygen Therapy: Patient Spontanous Breathing and non-rebreather face mask  Post-op Assessment: Report given to PACU RN, Post -op Vital signs reviewed and stable and Patient moving all extremities  Post vital signs: Reviewed and stable  Complications: No apparent anesthesia complications

## 2014-07-31 ENCOUNTER — Encounter (HOSPITAL_COMMUNITY): Payer: Self-pay | Admitting: Internal Medicine

## 2014-08-01 ENCOUNTER — Telehealth (INDEPENDENT_AMBULATORY_CARE_PROVIDER_SITE_OTHER): Payer: Self-pay | Admitting: *Deleted

## 2014-08-01 ENCOUNTER — Encounter (INDEPENDENT_AMBULATORY_CARE_PROVIDER_SITE_OTHER): Payer: Self-pay | Admitting: *Deleted

## 2014-08-01 DIAGNOSIS — K7469 Other cirrhosis of liver: Secondary | ICD-10-CM

## 2014-08-01 NOTE — Telephone Encounter (Signed)
Kristen Marsh has a procedure on 07/26/14 with Dr. Laural Golden. He said patient needed to have labs and OV in 4 weeks. Apt has been scheduled on 08/23/14 with Deberah Castle, NP.

## 2014-08-01 NOTE — Telephone Encounter (Signed)
Labs have been ordered for 08/22/2014.

## 2014-08-01 NOTE — Telephone Encounter (Signed)
Dr.Rehman had noted that these labs were to be drawn prior to patient's OV in 4 weeks. Patient was given an appointment for 08/23/2014 to see TErri Setzer,NP. Labs are to be drawn 08/22/14.

## 2014-08-14 ENCOUNTER — Ambulatory Visit (INDEPENDENT_AMBULATORY_CARE_PROVIDER_SITE_OTHER): Payer: Medicaid Other | Admitting: Internal Medicine

## 2014-08-17 ENCOUNTER — Encounter (HOSPITAL_COMMUNITY): Payer: Self-pay | Admitting: Internal Medicine

## 2014-08-23 ENCOUNTER — Ambulatory Visit (INDEPENDENT_AMBULATORY_CARE_PROVIDER_SITE_OTHER): Payer: Medicaid Other | Admitting: Internal Medicine

## 2014-09-15 ENCOUNTER — Ambulatory Visit: Payer: Medicaid Other | Admitting: Family

## 2014-09-19 ENCOUNTER — Other Ambulatory Visit: Payer: Self-pay | Admitting: Family Medicine

## 2014-09-26 ENCOUNTER — Other Ambulatory Visit: Payer: Self-pay | Admitting: Family

## 2014-09-26 ENCOUNTER — Other Ambulatory Visit: Payer: Self-pay

## 2014-09-27 ENCOUNTER — Telehealth: Payer: Self-pay | Admitting: Family

## 2014-09-27 ENCOUNTER — Ambulatory Visit: Payer: Medicaid Other | Admitting: Family

## 2014-09-27 MED ORDER — LACTULOSE 10 GM/15ML PO SOLN: ORAL | Status: DC

## 2014-09-27 NOTE — Telephone Encounter (Signed)
Prescription sent to pharmacy.

## 2014-09-28 ENCOUNTER — Other Ambulatory Visit: Payer: Self-pay | Admitting: Family

## 2014-10-11 ENCOUNTER — Encounter: Payer: Self-pay | Admitting: Family

## 2014-10-11 ENCOUNTER — Ambulatory Visit (INDEPENDENT_AMBULATORY_CARE_PROVIDER_SITE_OTHER): Payer: Medicaid Other | Admitting: Family

## 2014-10-11 ENCOUNTER — Ambulatory Visit (INDEPENDENT_AMBULATORY_CARE_PROVIDER_SITE_OTHER): Payer: Medicaid Other

## 2014-10-11 VITALS — BP 137/78 | HR 95 | Temp 98.7°F | Ht 62.0 in | Wt 127.4 lb

## 2014-10-11 DIAGNOSIS — IMO0001 Reserved for inherently not codable concepts without codable children: Secondary | ICD-10-CM

## 2014-10-11 DIAGNOSIS — F411 Generalized anxiety disorder: Secondary | ICD-10-CM | POA: Insufficient documentation

## 2014-10-11 DIAGNOSIS — Z1321 Encounter for screening for nutritional disorder: Secondary | ICD-10-CM

## 2014-10-11 DIAGNOSIS — K589 Irritable bowel syndrome without diarrhea: Secondary | ICD-10-CM | POA: Diagnosis not present

## 2014-10-11 DIAGNOSIS — K7460 Unspecified cirrhosis of liver: Secondary | ICD-10-CM

## 2014-10-11 DIAGNOSIS — Z23 Encounter for immunization: Secondary | ICD-10-CM | POA: Diagnosis not present

## 2014-10-11 DIAGNOSIS — R1084 Generalized abdominal pain: Secondary | ICD-10-CM

## 2014-10-11 DIAGNOSIS — M5030 Other cervical disc degeneration, unspecified cervical region: Secondary | ICD-10-CM

## 2014-10-11 DIAGNOSIS — K219 Gastro-esophageal reflux disease without esophagitis: Secondary | ICD-10-CM

## 2014-10-11 DIAGNOSIS — J449 Chronic obstructive pulmonary disease, unspecified: Secondary | ICD-10-CM

## 2014-10-11 DIAGNOSIS — M797 Fibromyalgia: Secondary | ICD-10-CM

## 2014-10-11 MED ORDER — ALBUTEROL SULFATE HFA 108 (90 BASE) MCG/ACT IN AERS: INHALATION_SPRAY | RESPIRATORY_TRACT | Status: DC

## 2014-10-11 MED ORDER — DICYCLOMINE HCL 20 MG PO TABS: 20.0000 mg | ORAL_TABLET | Freq: Four times a day (QID) | ORAL | Status: DC

## 2014-10-11 NOTE — Patient Instructions (Signed)
Health Maintenance Adopting a healthy lifestyle and getting preventive care can go a long way to promote health and wellness. Talk with your health care provider about what schedule of regular examinations is right for you. This is a good chance for you to check in with your provider about disease prevention and staying healthy. In between checkups, there are plenty of things you can do on your own. Experts have done a lot of research about which lifestyle changes and preventive measures are most likely to keep you healthy. Ask your health care provider for more information. WEIGHT AND DIET  Eat a healthy diet  Be sure to include plenty of vegetables, fruits, low-fat dairy products, and lean protein.  Do not eat a lot of foods high in solid fats, added sugars, or salt.  Get regular exercise. This is one of the most important things you can do for your health.  Most adults should exercise for at least 150 minutes each week. The exercise should increase your heart rate and make you sweat (moderate-intensity exercise).  Most adults should also do strengthening exercises at least twice a week. This is in addition to the moderate-intensity exercise.  Maintain a healthy weight  Body mass index (BMI) is a measurement that can be used to identify possible weight problems. It estimates body fat based on height and weight. Your health care provider can help determine your BMI and help you achieve or maintain a healthy weight.  For females 25 years of age and older:   A BMI below 18.5 is considered underweight.  A BMI of 18.5 to 24.9 is normal.  A BMI of 25 to 29.9 is considered overweight.  A BMI of 30 and above is considered obese.  Watch levels of cholesterol and blood lipids  You should start having your blood tested for lipids and cholesterol at 56 years of age, then have this test every 5 years.  You may need to have your cholesterol levels checked more often if:  Your lipid or  cholesterol levels are high.  You are older than 57 years of age.  You are at high risk for heart disease.  CANCER SCREENING   Lung Cancer  Lung cancer screening is recommended for adults 97-92 years old who are at high risk for lung cancer because of a history of smoking.  A yearly low-dose CT scan of the lungs is recommended for people who:  Currently smoke.  Have quit within the past 15 years.  Have at least a 30-pack-year history of smoking. A pack year is smoking an average of one pack of cigarettes a day for 1 year.  Yearly screening should continue until it has been 15 years since you quit.  Yearly screening should stop if you develop a health problem that would prevent you from having lung cancer treatment.  Breast Cancer  Practice breast self-awareness. This means understanding how your breasts normally appear and feel.  It also means doing regular breast self-exams. Let your health care provider know about any changes, no matter how small.  If you are in your 20s or 30s, you should have a clinical breast exam (CBE) by a health care provider every 1-3 years as part of a regular health exam.  If you are 76 or older, have a CBE every year. Also consider having a breast X-ray (mammogram) every year.  If you have a family history of breast cancer, talk to your health care provider about genetic screening.  If you are  at high risk for breast cancer, talk to your health care provider about having an MRI and a mammogram every year.  Breast cancer gene (BRCA) assessment is recommended for women who have family members with BRCA-related cancers. BRCA-related cancers include:  Breast.  Ovarian.  Tubal.  Peritoneal cancers.  Results of the assessment will determine the need for genetic counseling and BRCA1 and BRCA2 testing. Cervical Cancer Routine pelvic examinations to screen for cervical cancer are no longer recommended for nonpregnant women who are considered low  risk for cancer of the pelvic organs (ovaries, uterus, and vagina) and who do not have symptoms. A pelvic examination may be necessary if you have symptoms including those associated with pelvic infections. Ask your health care provider if a screening pelvic exam is right for you.   The Pap test is the screening test for cervical cancer for women who are considered at risk.  If you had a hysterectomy for a problem that was not cancer or a condition that could lead to cancer, then you no longer need Pap tests.  If you are older than 65 years, and you have had normal Pap tests for the past 10 years, you no longer need to have Pap tests.  If you have had past treatment for cervical cancer or a condition that could lead to cancer, you need Pap tests and screening for cancer for at least 20 years after your treatment.  If you no longer get a Pap test, assess your risk factors if they change (such as having a new sexual partner). This can affect whether you should start being screened again.  Some women have medical problems that increase their chance of getting cervical cancer. If this is the case for you, your health care provider may recommend more frequent screening and Pap tests.  The human papillomavirus (HPV) test is another test that may be used for cervical cancer screening. The HPV test looks for the virus that can cause cell changes in the cervix. The cells collected during the Pap test can be tested for HPV.  The HPV test can be used to screen women 30 years of age and older. Getting tested for HPV can extend the interval between normal Pap tests from three to five years.  An HPV test also should be used to screen women of any age who have unclear Pap test results.  After 57 years of age, women should have HPV testing as often as Pap tests.  Colorectal Cancer  This type of cancer can be detected and often prevented.  Routine colorectal cancer screening usually begins at 57 years of  age and continues through 57 years of age.  Your health care provider may recommend screening at an earlier age if you have risk factors for colon cancer.  Your health care provider may also recommend using home test kits to check for hidden blood in the stool.  A small camera at the end of a tube can be used to examine your colon directly (sigmoidoscopy or colonoscopy). This is done to check for the earliest forms of colorectal cancer.  Routine screening usually begins at age 50.  Direct examination of the colon should be repeated every 5-10 years through 57 years of age. However, you may need to be screened more often if early forms of precancerous polyps or small growths are found. Skin Cancer  Check your skin from head to toe regularly.  Tell your health care provider about any new moles or changes in   moles, especially if there is a change in a mole's shape or color.  Also tell your health care provider if you have a mole that is larger than the size of a pencil eraser.  Always use sunscreen. Apply sunscreen liberally and repeatedly throughout the day.  Protect yourself by wearing long sleeves, pants, a wide-brimmed hat, and sunglasses whenever you are outside. HEART DISEASE, DIABETES, AND HIGH BLOOD PRESSURE   Have your blood pressure checked at least every 1-2 years. High blood pressure causes heart disease and increases the risk of stroke.  If you are between 75 years and 42 years old, ask your health care provider if you should take aspirin to prevent strokes.  Have regular diabetes screenings. This involves taking a blood sample to check your fasting blood sugar level.  If you are at a normal weight and have a low risk for diabetes, have this test once every three years after 57 years of age.  If you are overweight and have a high risk for diabetes, consider being tested at a younger age or more often. PREVENTING INFECTION  Hepatitis B  If you have a higher risk for  hepatitis B, you should be screened for this virus. You are considered at high risk for hepatitis B if:  You were born in a country where hepatitis B is common. Ask your health care provider which countries are considered high risk.  Your parents were born in a high-risk country, and you have not been immunized against hepatitis B (hepatitis B vaccine).  You have HIV or AIDS.  You use needles to inject street drugs.  You live with someone who has hepatitis B.  You have had sex with someone who has hepatitis B.  You get hemodialysis treatment.  You take certain medicines for conditions, including cancer, organ transplantation, and autoimmune conditions. Hepatitis C  Blood testing is recommended for:  Everyone born from [birth year] through 1963.  Anyone with known risk factors for hepatitis C. Sexually transmitted infections (STIs)  You should be screened for sexually transmitted infections (STIs) including gonorrhea and chlamydia if:  You are sexually active and are younger than 57 years of age.  You are older than 57 years of age and your health care provider tells you that you are at risk for this type of infection.  Your sexual activity has changed since you were last screened and you are at an increased risk for chlamydia or gonorrhea. Ask your health care provider if you are at risk.  If you do not have HIV, but are at risk, it may be recommended that you take a prescription medicine daily to prevent HIV infection. This is called pre-exposure prophylaxis (PrEP). You are considered at risk if:  You are sexually active and do not regularly use condoms or know the HIV status of your partner(s).  You take drugs by injection.  You are sexually active with a partner who has HIV. Talk with your health care provider about whether you are at high risk of being infected with HIV. If you choose to begin PrEP, you should first be tested for HIV. You should then be tested every 3 months for  as long as you are taking PrEP.  PREGNANCY   If you are premenopausal and you may become pregnant, ask your health care provider about preconception counseling.  If you may become pregnant, take 400 to 800 micrograms (mcg) of folic acid every day.  If you want to prevent pregnancy, talk to your  health care provider about birth control (contraception). OSTEOPOROSIS AND MENOPAUSE   Osteoporosis is a disease in which the bones lose minerals and strength with aging. This can result in serious bone fractures. Your risk for osteoporosis can be identified using a bone density scan.  If you are 65 years of age or older, or if you are at risk for osteoporosis and fractures, ask your health care provider if you should be screened.  Ask your health care provider whether you should take a calcium or vitamin D supplement to lower your risk for osteoporosis.  Menopause may have certain physical symptoms and risks.  Hormone replacement therapy may reduce some of these symptoms and risks. Talk to your health care provider about whether hormone replacement therapy is right for you.  HOME CARE INSTRUCTIONS   Schedule regular health, dental, and eye exams.  Stay current with your immunizations.   Do not use any tobacco products including cigarettes, chewing tobacco, or electronic cigarettes.  If you are pregnant, do not drink alcohol.  If you are breastfeeding, limit how much and how often you drink alcohol.  Limit alcohol intake to no more than 1 drink per day for nonpregnant women. One drink equals 12 ounces of beer, 5 ounces of wine, or 1 ounces of hard liquor.  Do not use street drugs.  Do not share needles.  Ask your health care provider for help if you need support or information about quitting drugs.  Tell your health care provider if you often feel depressed.  Tell your health care provider if you have ever been abused or do not feel safe at home. Document Released: 02/03/2011  Document Revised: 12/05/2013 Document Reviewed: 06/22/2013 Exit[employer] Patient Information 2015 [employer]. This information is not intended to replace advice given to you by your health care provider. Make sure you discuss any questions you have with your health care provider. Generalized Anxiety Disorder Generalized anxiety disorder (GAD) is a mental disorder. It interferes with life functions, including relationships, work, and school. GAD is different from normal anxiety, which everyone experiences at some point in their lives in response to specific life events and activities. Normal anxiety actually helps us prepare for and get through these life events and activities. Normal anxiety goes away after the event or activity is over.  GAD causes anxiety that is not necessarily related to specific events or activities. It also causes excess anxiety in proportion to specific events or activities. The anxiety associated with GAD is also difficult to control. GAD can vary from mild to severe. People with severe GAD can have intense waves of anxiety with physical symptoms (panic attacks).  SYMPTOMS The anxiety and worry associated with GAD are difficult to control. This anxiety and worry are related to many life events and activities and also occur more days than not for 6 months or longer. People with GAD also have three or more of the following symptoms (one or more in children):  Restlessness.   Fatigue.  Difficulty concentrating.   Irritability.  Muscle tension.  Difficulty sleeping or unsatisfying sleep. DIAGNOSIS GAD is diagnosed through an assessment by your health care provider. Your health care provider will ask you questions aboutyour mood,physical symptoms, and events in your life. Your health care provider may ask you about your medical history and use of alcohol or drugs, including prescription medicines. Your health care provider may also do a physical exam and blood tests.  Certain medical conditions and the use of certain substances can cause   symptoms similar to those associated with GAD. Your health care provider may refer you to a mental health specialist for further evaluation. TREATMENT The following therapies are usually used to treat GAD:   Medication. Antidepressant medication usually is prescribed for long-term daily control. Antianxiety medicines may be added in severe cases, especially when panic attacks occur.   Talk therapy (psychotherapy). Certain types of talk therapy can be helpful in treating GAD by providing support, education, and guidance. A form of talk therapy called cognitive behavioral therapy can teach you healthy ways to think about and react to daily life events and activities.  Stress managementtechniques. These include yoga, meditation, and exercise and can be very helpful when they are practiced regularly. A mental health specialist can help determine which treatment is best for you. Some people see improvement with one therapy. However, other people require a combination of therapies. Document Released: 11/15/2012 Document Revised: 12/05/2013 Document Reviewed: 11/15/2012 [employer] Patient Information 2015 [employer]. This information is not intended to replace advice given to you by your health care provider. Make sure you discuss any questions you have with your health care provider.  

## 2014-10-11 NOTE — Progress Notes (Signed)
Subjective:    Patient ID: Kristen Marsh, female    DOB: 1955/10/02, 59 y.o.   MRN: [id number]  HPI Pt presents to the office today with multiple problems and chronic problems. Pt has the following chronic problems: has Cirrhosis of liver; Degenerative cervical disc; Fibromyalgia; Muscle cramps; GERD (gastroesophageal reflux disease); COPD bronchitis; and Alcoholism /alcohol abuse on her problem list. Pt is complaining abd pain today, GAD, and generalized achy pain all over. PT states her acid reflux is worse because her GI doctor took her off her omeprazole.     Pt has a GI doctor that she is see's regularly who manages her Cirrhosis, GERD, and Hep C. Pt also has a rheumatology who manages her fibromyalgia.   Review of Systems  Constitutional: Negative.   HENT: Negative.   Eyes: Negative.   Respiratory: Negative.  Negative for shortness of breath.   Cardiovascular: Negative.  Negative for palpitations.  Gastrointestinal: Positive for abdominal pain.  Endocrine: Negative.   Genitourinary: Negative.   Musculoskeletal: Positive for arthralgias.  Neurological: Negative.  Negative for headaches.  Hematological: Negative.   Psychiatric/Behavioral: The patient is nervous/anxious.   All other systems reviewed and are negative.      Objective:   Physical Exam  Constitutional: She is oriented to person, place, and time. She appears well-developed and well-nourished. No distress.  HENT:  Head: Normocephalic and atraumatic.  Right Ear: External ear normal.  Left Ear: External ear normal.  Mouth/Throat: Oropharynx is clear and moist.  Eyes: Pupils are equal, round, and reactive to light.  Neck: Normal range of motion. Neck supple. No thyromegaly present.  Cardiovascular: Normal rate, regular rhythm, normal heart sounds and intact distal pulses.   No murmur heard. Pulmonary/Chest: Effort normal and breath sounds normal. No respiratory distress. She has no wheezes.  Abdominal: Soft. Bowel  sounds are normal. She exhibits no distension. There is tenderness (Generalized tenderness over abd).  Musculoskeletal: Normal range of motion. She exhibits no edema or tenderness.  Neurological: She is alert and oriented to person, place, and time. She has normal reflexes. No cranial nerve deficit.  Skin: Skin is warm and dry.  Psychiatric: She has a normal mood and affect. Her behavior is normal. Judgment and thought content normal.  Vitals reviewed.   BP 137/78 mmHg  Pulse 95  Temp(Src) 98.7 F (37.1 C) (Oral)  Ht _0  (1.575 m)  Wt 127 lb 6.4 oz (57.788 kg)  BMI 23.30 kg/m2  KUB- Enlarged liver- Preliminary reading by Evelina Dun, FNP [facility]      Assessment & Plan:  1. COPD bronchitis - CMP14+EGFR - albuterol (PROVENTIL HFA) 108 (90 BASE) MCG/ACT inhaler; INHALE 2 PUFFS INTO THE LUNGS EVERY FOUR HOURS AS NEEDED FOR WHEEZING.  Dispense: 6.7 g; Refill: 3  2. Gastroesophageal reflux disease, esophagitis presence not specified - CMP14+EGFR  3. Degenerative cervical disc - CMP14+EGFR  4. Cirrhosis of liver without ascites, unspecified hepatic cirrhosis type - CMP14+EGFR - DG Abd 1 View; Future  5. Fibromyalgia - CMP14+EGFR  6. GAD (generalized anxiety disorder) - CMP14+EGFR  7. IBS (irritable bowel syndrome) - CMP14+EGFR - dicyclomine (BENTYL) 20 MG tablet; Take 1 tablet (20 mg total) by mouth every 6 (six) hours.  Dispense: 120 tablet; Refill: 6  8. Encounter for vitamin deficiency screening  - Vit D  25 hydroxy (rtn osteoporosis monitoring)  9. Generalized abdominal pain - DG Abd 1 View; Future   Continue all meds Labs pending Health Maintenance reviewed Diet and exercise encouraged RTO  6 months  Evelina Dun, FNP

## 2014-10-12 LAB — CMP14+EGFR
ALBUMIN: 3.5 g/dL (ref 3.5–5.5)
ALT: 97 IU/L — AB (ref 0–32)
AST: 253 IU/L — ABNORMAL HIGH (ref 0–40)
Albumin/Globulin Ratio: 0.8 — ABNORMAL LOW (ref 1.1–2.5)
Alkaline Phosphatase: 167 IU/L — ABNORMAL HIGH (ref 39–117)
BUN / CREAT RATIO: 8 — AB (ref 9–23)
BUN: 5 mg/dL — ABNORMAL LOW (ref 6–24)
Bilirubin Total: 2.4 mg/dL — ABNORMAL HIGH (ref 0.0–1.2)
CALCIUM: 8.8 mg/dL (ref 8.7–10.2)
CO2: 24 mmol/L (ref 18–29)
CREATININE: 0.61 mg/dL (ref 0.57–1.00)
Chloride: 101 mmol/L (ref 97–108)
GFR calc Af Amer: 116 mL/min/{1.73_m2} (ref >59)
GFR, EST NON AFRICAN AMERICAN: 101 mL/min/{1.73_m2} (ref >59)
GLOBULIN, TOTAL: 4.4 g/dL (ref 1.5–4.5)
GLUCOSE: 100 mg/dL — AB (ref 65–99)
POTASSIUM: 4.3 mmol/L (ref 3.5–5.2)
SODIUM: 139 mmol/L (ref 134–144)
Total Protein: 7.9 g/dL (ref 6.0–8.5)

## 2014-10-12 LAB — VITAMIN D 25 HYDROXY (VIT D DEFICIENCY, FRACTURES): Vit D, 25-Hydroxy: 14.1 ng/mL — ABNORMAL LOW (ref 30.0–100.0)

## 2014-11-08 ENCOUNTER — Ambulatory Visit (INDEPENDENT_AMBULATORY_CARE_PROVIDER_SITE_OTHER): Payer: Medicaid Other | Admitting: Internal Medicine

## 2014-11-20 ENCOUNTER — Telehealth: Payer: Self-pay | Admitting: Family

## 2014-11-20 DIAGNOSIS — R490 Dysphonia: Secondary | ICD-10-CM

## 2014-11-20 NOTE — Telephone Encounter (Signed)
ENT referral sent per pt's request

## 2014-11-21 ENCOUNTER — Telehealth: Payer: Self-pay | Admitting: Family

## 2014-11-22 ENCOUNTER — Ambulatory Visit (INDEPENDENT_AMBULATORY_CARE_PROVIDER_SITE_OTHER): Payer: Medicaid Other | Admitting: Internal Medicine

## 2014-11-22 NOTE — Telephone Encounter (Signed)
Both rx's have refills, pt aware.

## 2014-11-23 ENCOUNTER — Telehealth: Payer: Self-pay | Admitting: Family

## 2014-11-25 ENCOUNTER — Other Ambulatory Visit: Payer: Self-pay | Admitting: Family

## 2014-11-27 MED ORDER — ALBUTEROL SULFATE (2.5 MG/3ML) 0.083% IN NEBU: 2.5000 mg | INHALATION_SOLUTION | Freq: Four times a day (QID) | RESPIRATORY_TRACT | Status: DC | PRN

## 2014-11-27 MED ORDER — LACTULOSE 10 GM/15ML PO SOLN: ORAL | Status: DC

## 2014-11-27 NOTE — Telephone Encounter (Signed)
done

## 2014-12-16 ENCOUNTER — Other Ambulatory Visit (INDEPENDENT_AMBULATORY_CARE_PROVIDER_SITE_OTHER): Payer: Self-pay | Admitting: Internal Medicine

## 2014-12-28 ENCOUNTER — Ambulatory Visit (INDEPENDENT_AMBULATORY_CARE_PROVIDER_SITE_OTHER): Payer: Medicaid Other | Admitting: Otolaryngology

## 2014-12-28 DIAGNOSIS — R490 Dysphonia: Secondary | ICD-10-CM

## 2014-12-28 DIAGNOSIS — K219 Gastro-esophageal reflux disease without esophagitis: Secondary | ICD-10-CM | POA: Diagnosis not present

## 2014-12-28 DIAGNOSIS — J381 Polyp of vocal cord and larynx: Secondary | ICD-10-CM

## 2014-12-29 ENCOUNTER — Other Ambulatory Visit: Payer: Self-pay | Admitting: Otolaryngology

## 2015-01-03 ENCOUNTER — Encounter (INDEPENDENT_AMBULATORY_CARE_PROVIDER_SITE_OTHER): Payer: Self-pay | Admitting: *Deleted

## 2015-01-11 ENCOUNTER — Encounter (HOSPITAL_BASED_OUTPATIENT_CLINIC_OR_DEPARTMENT_OTHER): Payer: Self-pay | Admitting: *Deleted

## 2015-01-11 NOTE — Progress Notes (Signed)
Bring all medications. Coming tomorrow for CMET and EKG.

## 2015-01-12 ENCOUNTER — Telehealth: Payer: Self-pay | Admitting: Family

## 2015-01-12 ENCOUNTER — Encounter (HOSPITAL_COMMUNITY)
Admission: RE | Admit: 2015-01-12 | Discharge: 2015-01-12 | Disposition: A | Discharge status: Home / Self Care | Payer: Medicaid Other | Source: Ambulatory Visit | Attending: Otolaryngology | Admitting: Otolaryngology

## 2015-01-12 DIAGNOSIS — Z01818 Encounter for other preprocedural examination: Secondary | ICD-10-CM | POA: Insufficient documentation

## 2015-01-12 DIAGNOSIS — J382 Nodules of vocal cords: Secondary | ICD-10-CM | POA: Diagnosis not present

## 2015-01-12 DIAGNOSIS — IMO0001 Reserved for inherently not codable concepts without codable children: Secondary | ICD-10-CM

## 2015-01-12 LAB — COMPREHENSIVE METABOLIC PANEL
ALK PHOS: 109 U/L (ref 38–126)
ALT: 87 U/L — ABNORMAL HIGH (ref 14–54)
AST: 158 U/L — ABNORMAL HIGH (ref 15–41)
Albumin: 2.6 g/dL — ABNORMAL LOW (ref 3.5–5.0)
Anion gap: 6 (ref 5–15)
BUN: 5 mg/dL — ABNORMAL LOW (ref 6–20)
CO2: 29 mmol/L (ref 22–32)
CREATININE: 0.57 mg/dL (ref 0.44–1.00)
Calcium: 8.1 mg/dL — ABNORMAL LOW (ref 8.9–10.3)
Chloride: 102 mmol/L (ref 101–111)
GFR calc Af Amer: >60 mL/min (ref >60)
GFR calc non Af Amer: >60 mL/min (ref >60)
GLUCOSE: 178 mg/dL — AB (ref 65–99)
POTASSIUM: 3.8 mmol/L (ref 3.5–5.1)
Sodium: 137 mmol/L (ref 135–145)
Total Bilirubin: 1.7 mg/dL — ABNORMAL HIGH (ref 0.3–1.2)
Total Protein: 6.4 g/dL — ABNORMAL LOW (ref 6.5–8.1)

## 2015-01-12 MED ORDER — ALBUTEROL SULFATE HFA 108 (90 BASE) MCG/ACT IN AERS: INHALATION_SPRAY | RESPIRATORY_TRACT | Status: DC

## 2015-01-12 NOTE — Telephone Encounter (Signed)
done

## 2015-01-15 ENCOUNTER — Ambulatory Visit (HOSPITAL_BASED_OUTPATIENT_CLINIC_OR_DEPARTMENT_OTHER): Payer: Medicaid Other | Admitting: Anesthesiology

## 2015-01-15 ENCOUNTER — Encounter (HOSPITAL_BASED_OUTPATIENT_CLINIC_OR_DEPARTMENT_OTHER): Payer: Self-pay

## 2015-01-15 ENCOUNTER — Ambulatory Visit (HOSPITAL_BASED_OUTPATIENT_CLINIC_OR_DEPARTMENT_OTHER)
Admission: RE | Admit: 2015-01-15 | Discharge: 2015-01-15 | Disposition: A | Discharge status: Home / Self Care | Payer: Medicaid Other | Source: Ambulatory Visit | Attending: Otolaryngology | Admitting: Otolaryngology

## 2015-01-15 ENCOUNTER — Encounter (HOSPITAL_BASED_OUTPATIENT_CLINIC_OR_DEPARTMENT_OTHER)
Admission: RE | Disposition: A | Discharge status: Home / Self Care | Payer: Self-pay | Source: Ambulatory Visit | Attending: Otolaryngology

## 2015-01-15 DIAGNOSIS — J309 Allergic rhinitis, unspecified: Secondary | ICD-10-CM | POA: Diagnosis not present

## 2015-01-15 DIAGNOSIS — M1990 Unspecified osteoarthritis, unspecified site: Secondary | ICD-10-CM | POA: Insufficient documentation

## 2015-01-15 DIAGNOSIS — B1920 Unspecified viral hepatitis C without hepatic coma: Secondary | ICD-10-CM | POA: Diagnosis not present

## 2015-01-15 DIAGNOSIS — K589 Irritable bowel syndrome without diarrhea: Secondary | ICD-10-CM | POA: Insufficient documentation

## 2015-01-15 DIAGNOSIS — K219 Gastro-esophageal reflux disease without esophagitis: Secondary | ICD-10-CM | POA: Diagnosis not present

## 2015-01-15 DIAGNOSIS — D141 Benign neoplasm of larynx: Secondary | ICD-10-CM | POA: Insufficient documentation

## 2015-01-15 DIAGNOSIS — F419 Anxiety disorder, unspecified: Secondary | ICD-10-CM | POA: Diagnosis not present

## 2015-01-15 DIAGNOSIS — J439 Emphysema, unspecified: Secondary | ICD-10-CM | POA: Insufficient documentation

## 2015-01-15 DIAGNOSIS — D380 Neoplasm of uncertain behavior of larynx: Secondary | ICD-10-CM | POA: Diagnosis not present

## 2015-01-15 DIAGNOSIS — I10 Essential (primary) hypertension: Secondary | ICD-10-CM | POA: Diagnosis not present

## 2015-01-15 DIAGNOSIS — M797 Fibromyalgia: Secondary | ICD-10-CM | POA: Diagnosis not present

## 2015-01-15 DIAGNOSIS — F17210 Nicotine dependence, cigarettes, uncomplicated: Secondary | ICD-10-CM | POA: Insufficient documentation

## 2015-01-15 DIAGNOSIS — R490 Dysphonia: Secondary | ICD-10-CM | POA: Diagnosis not present

## 2015-01-15 HISTORY — DX: Asymptomatic varicose veins of unspecified lower extremity: I83.90

## 2015-01-15 HISTORY — DX: Major depressive disorder, single episode, unspecified: F32.9

## 2015-01-15 HISTORY — PX: DIRECT LARYNGOSCOPY: SHX5326

## 2015-01-15 HISTORY — DX: Depression, unspecified: F32.A

## 2015-01-15 HISTORY — DX: Unspecified cirrhosis of liver: K74.60

## 2015-01-15 LAB — POCT HEMOGLOBIN-HEMACUE: HEMOGLOBIN: 12.9 g/dL (ref 12.0–15.0)

## 2015-01-15 SURGERY — LARYNGOSCOPY, DIRECT
Anesthesia: General

## 2015-01-15 MED ORDER — FENTANYL CITRATE (PF) 100 MCG/2ML IJ SOLN: 25.0000 ug | INTRAMUSCULAR | Status: DC | PRN

## 2015-01-15 MED ORDER — METHYLENE BLUE 1 % INJ SOLN
INTRAMUSCULAR | Status: AC
  Filled 2015-01-15: qty 10

## 2015-01-15 MED ORDER — MIDAZOLAM HCL 5 MG/5ML IJ SOLN
INTRAMUSCULAR | Status: DC | PRN
  Administered 2015-01-15: 2 mg via INTRAVENOUS

## 2015-01-15 MED ORDER — DEXAMETHASONE SODIUM PHOSPHATE 4 MG/ML IJ SOLN
INTRAMUSCULAR | Status: DC | PRN
  Administered 2015-01-15: 10 mg via INTRAVENOUS

## 2015-01-15 MED ORDER — EPINEPHRINE HCL 1 MG/ML IJ SOLN
INTRAMUSCULAR | Status: DC | PRN
  Administered 2015-01-15: 1 mg

## 2015-01-15 MED ORDER — FENTANYL CITRATE (PF) 100 MCG/2ML IJ SOLN
INTRAMUSCULAR | Status: DC | PRN
  Administered 2015-01-15 (×2): 50 ug via INTRAVENOUS
  Administered 2015-01-15: 100 ug via INTRAVENOUS

## 2015-01-15 MED ORDER — MIDAZOLAM HCL 2 MG/2ML IJ SOLN: 0.5000 mg | Freq: Once | INTRAMUSCULAR | Status: DC | PRN

## 2015-01-15 MED ORDER — FENTANYL CITRATE (PF) 100 MCG/2ML IJ SOLN
INTRAMUSCULAR | Status: AC
  Filled 2015-01-15: qty 4

## 2015-01-15 MED ORDER — LIDOCAINE HCL (CARDIAC) 20 MG/ML IV SOLN
INTRAVENOUS | Status: DC | PRN
  Administered 2015-01-15: 50 mg via INTRAVENOUS

## 2015-01-15 MED ORDER — PROMETHAZINE HCL 25 MG/ML IJ SOLN: 6.2500 mg | INTRAMUSCULAR | Status: DC | PRN

## 2015-01-15 MED ORDER — LIDOCAINE-EPINEPHRINE 1 %-1:100000 IJ SOLN
INTRAMUSCULAR | Status: AC
  Filled 2015-01-15: qty 1

## 2015-01-15 MED ORDER — MEPERIDINE HCL 25 MG/ML IJ SOLN: 6.2500 mg | INTRAMUSCULAR | Status: DC | PRN

## 2015-01-15 MED ORDER — MIDAZOLAM HCL 2 MG/2ML IJ SOLN
INTRAMUSCULAR | Status: AC
  Filled 2015-01-15: qty 2

## 2015-01-15 MED ORDER — EPINEPHRINE HCL 1 MG/ML IJ SOLN
INTRAMUSCULAR | Status: AC
  Filled 2015-01-15: qty 1

## 2015-01-15 MED ORDER — ONDANSETRON HCL 4 MG/2ML IJ SOLN
INTRAMUSCULAR | Status: DC | PRN
Start: 2015-01-15 — End: 2015-01-15
  Administered 2015-01-15: 4 mg via INTRAVENOUS

## 2015-01-15 MED ORDER — PROPOFOL 10 MG/ML IV BOLUS
INTRAVENOUS | Status: DC | PRN
  Administered 2015-01-15: 40 mg via INTRAVENOUS
  Administered 2015-01-15: 100 mg via INTRAVENOUS
  Administered 2015-01-15: 50 mg via INTRAVENOUS

## 2015-01-15 MED ORDER — LACTATED RINGERS IV SOLN
INTRAVENOUS | Status: DC
  Administered 2015-01-15 (×2): via INTRAVENOUS

## 2015-01-15 MED ORDER — LIDOCAINE HCL (CARDIAC) 20 MG/ML IV SOLN: INTRAVENOUS | Status: DC | PRN

## 2015-01-15 MED ORDER — SUCCINYLCHOLINE CHLORIDE 20 MG/ML IJ SOLN
INTRAMUSCULAR | Status: DC | PRN
  Administered 2015-01-15: 100 mg via INTRAVENOUS

## 2015-01-15 MED ORDER — OXYCODONE HCL 5 MG PO TABA: 5.0000 mg | ORAL_TABLET | ORAL | Status: DC | PRN

## 2015-01-15 SURGICAL SUPPLY — 23 items
CANISTER SUCT 1200ML W/VALVE (MISCELLANEOUS) ×3 IMPLANT
GLOVE BIO SURGEON STRL SZ7.5 (GLOVE) ×3 IMPLANT
GLOVE BIOGEL PI IND STRL 7.0 (GLOVE) ×1 IMPLANT
GLOVE BIOGEL PI INDICATOR 7.0 (GLOVE) ×2
GLOVE ECLIPSE 6.5 STRL STRAW (GLOVE) ×3 IMPLANT
GOWN STRL REUS W/ TWL LRG LVL3 (GOWN DISPOSABLE) IMPLANT
GOWN STRL REUS W/TWL LRG LVL3 (GOWN DISPOSABLE)
GUARD TEETH (MISCELLANEOUS) ×3 IMPLANT
MARKER SKIN DUAL TIP RULER LAB (MISCELLANEOUS) IMPLANT
NEEDLE SPNL 22GX7 QUINCKE BK (NEEDLE) IMPLANT
NEEDLE SPNL 25GX3.5 QUINCKE BL (NEEDLE) IMPLANT
NEEDLE TRANS ORAL INJECTION (NEEDLE) IMPLANT
NS IRRIG 1000ML POUR BTL (IV SOLUTION) ×3 IMPLANT
PATTIES SURGICAL .5 X3 (DISPOSABLE) ×3 IMPLANT
SHEET MEDIUM DRAPE 40X70 STRL (DRAPES) ×3 IMPLANT
SLEEVE SCD COMPRESS KNEE MED (MISCELLANEOUS) ×3 IMPLANT
SOLUTION BUTLER CLEAR DIP (MISCELLANEOUS) ×3 IMPLANT
SPONGE GAUZE 4X4 12PLY STER LF (GAUZE/BANDAGES/DRESSINGS) ×3 IMPLANT
SYR CONTROL 10ML LL (SYRINGE) IMPLANT
SYR TB 1ML LL NO SAFETY (SYRINGE) IMPLANT
TOWEL OR 17X24 6PK STRL BLUE (TOWEL DISPOSABLE) ×3 IMPLANT
TUBE CONNECTING 20'X1/4 (TUBING) ×1
TUBE CONNECTING 20X1/4 (TUBING) ×2 IMPLANT

## 2015-01-15 NOTE — Anesthesia Preprocedure Evaluation (Addendum)
Anesthesia Evaluation  Patient identified by MRN, date of birth, ID band Patient awake    Reviewed: Allergy & Precautions, NPO status , Patient's Chart, lab work & pertinent test results  History of Anesthesia Complications Negative for: history of anesthetic complications  Airway Mallampati: II  TM Distance: >3 FB Neck ROM: Full    Dental  (+) Poor Dentition, Missing, Chipped, Dental Advisory Given   Pulmonary COPD COPD inhaler, Current Smoker,  breath sounds clear to auscultation   + stridor     Cardiovascular hypertension, Pt. on medications - anginaRhythm:Regular Rate:Normal     Neuro/Psych Anxiety Depression    GI/Hepatic GERD-  Medicated and Controlled,(+)     substance abuse  alcohol use and cocaine use, Hepatitis - (elevated LFTs), C  Endo/Other  negative endocrine ROS  Renal/GU negative Renal ROS     Musculoskeletal  (+) Fibromyalgia -  Abdominal   Peds  Hematology negative hematology ROS (+)   Anesthesia Other Findings   Reproductive/Obstetrics                           Anesthesia Physical Anesthesia Plan  ASA: III  Anesthesia Plan: General   Post-op Pain Management:    Induction: Intravenous  Airway Management Planned: Oral ETT  Additional Equipment:   Intra-op Plan:   Post-operative Plan: Extubation in OR  Informed Consent: I have reviewed the patients History and Physical, chart, labs and discussed the procedure including the risks, benefits and alternatives for the proposed anesthesia with the patient or authorized representative who has indicated his/her understanding and acceptance.   Dental advisory given  Plan Discussed with: CRNA and Surgeon  Anesthesia Plan Comments: (Plan routine monitors, GETA)        Anesthesia Quick Evaluation

## 2015-01-15 NOTE — Transfer of Care (Signed)
Immediate Anesthesia Transfer of Care Note  Patient: Kristen Marsh  Procedure(s) Performed: Procedure(s): DIRECT LARYNGOSCOPY WITH BIOPSY (N/A)  Patient Location: PACU  Anesthesia Type:General  Level of Consciousness: awake and alert   Airway & Oxygen Therapy: Patient Spontanous Breathing and Patient connected to face mask oxygen  Post-op Assessment: Report given to RN and Post -op Vital signs reviewed and stable  Post vital signs: Reviewed and stable  Last Vitals:  Filed Vitals:   01/15/15 0959  BP: 152/74  Pulse: 77  Temp: 36.6 C  Resp: 22    Complications: No apparent anesthesia complications

## 2015-01-15 NOTE — Anesthesia Postprocedure Evaluation (Signed)
  Anesthesia Post-op Note  Patient: Kristen Marsh  Procedure(s) Performed: Procedure(s): DIRECT LARYNGOSCOPY WITH BIOPSY (N/A)  Patient Location: PACU  Anesthesia Type:General  Level of Consciousness: awake, alert , oriented and patient cooperative  Airway and Oxygen Therapy: Patient Spontanous Breathing  Post-op Pain: none  Post-op Assessment: Post-op Vital signs reviewed, Patient's Cardiovascular Status Stable, Respiratory Function Stable, Patent Airway, No signs of Nausea or vomiting and Pain level controlled              Post-op Vital Signs: Reviewed and stable  Last Vitals:  Filed Vitals:   01/15/15 1303  BP: 126/74  Pulse: 82  Temp: 36.7 C  Resp: 18    Complications: No apparent anesthesia complications

## 2015-01-15 NOTE — Anesthesia Procedure Notes (Signed)
Procedure Name: Intubation Date/Time: 01/15/2015 11:24 AM Performed by: Lieutenant Diego Pre-anesthesia Checklist: Patient identified, Emergency Drugs available, Suction available and Patient being monitored Patient Re-evaluated:Patient Re-evaluated prior to inductionOxygen Delivery Method: Circle System Utilized Preoxygenation: Pre-oxygenation with 100% oxygen Intubation Type: IV induction Ventilation: Mask ventilation without difficulty Laryngoscope Size: Miller and 2 Grade View: Grade I Tube type: Oral Tube size: 6.0 mm Number of attempts: 1 Airway Equipment and Method: Stylet and Oral airway Placement Confirmation: ETT inserted through vocal cords under direct vision,  positive ETCO2 and breath sounds checked- equal and bilateral Secured at: 18 cm Tube secured with: Tape Dental Injury: Teeth and Oropharynx as per pre-operative assessment

## 2015-01-15 NOTE — Discharge Instructions (Addendum)
The patient may resume all her previous activity and diet. No postop restriction. She will follow-up in my office in one week.   Post Anesthesia Home Care Instructions  Activity: Get plenty of rest for the remainder of the day. A responsible adult should stay with you for 24 hours following the procedure.  For the next 24 hours, DO NOT: -Drive a car -Paediatric nurse -Drink alcoholic beverages -Take any medication unless instructed by your physician -Make any legal decisions or sign important papers.  Meals: Start with liquid foods such as gelatin or soup. Progress to regular foods as tolerated. Avoid greasy, spicy, heavy foods. If nausea and/or vomiting occur, drink only clear liquids until the nausea and/or vomiting subsides. Call your physician if vomiting continues.  Special Instructions/Symptoms: Your throat may feel dry or sore from the anesthesia or the breathing tube placed in your throat during surgery. If this causes discomfort, gargle with warm salt water. The discomfort should disappear within 24 hours.  If you had a scopolamine patch placed behind your ear for the management of post- operative nausea and/or vomiting:  1. The medication in the patch is effective for 72 hours, after which it should be removed.  Wrap patch in a tissue and discard in the trash. Wash hands thoroughly with soap and water. 2. You may remove the patch earlier than 72 hours if you experience unpleasant side effects which may include dry mouth, dizziness or visual disturbances. 3. Avoid touching the patch. Wash your hands with soap and water after contact with the patch.

## 2015-01-15 NOTE — Op Note (Signed)
DATE OF PROCEDURE:  01/15/2015                              OPERATIVE REPORT  SURGEON:  Leta Baptist, MD  PREOPERATIVE DIAGNOSES: 1. Chronic hoarseness 2. Laryngeal polyps  POSTOPERATIVE DIAGNOSES: 1. Chronic hoarseness 2. Laryngeal polyps  PROCEDURE PERFORMED:  Mirodirect laryngoscopy with biopsy  ANESTHESIA:  General endotracheal tube anesthesia.  COMPLICATIONS:  None.  ESTIMATED BLOOD LOSS:  Minimal.  INDICATION FOR PROCEDURE:  Kristen Marsh is a 59 y.o. female with a history of chronic hoarseness for more than one year. She previously had her vocal cords biopsied. No malignancy was noted. The patient complains of worsening of her hoarseness over the past few months. On examination, severe posterior laryngeal edema was noted. A large soft tissue mass was also noted in the posterior supraglottic region.  Based on the above findings, the decision was made for the patient to undergo the direct laryngoscopy and biopsy procedure. The risks, benefits, alternatives, and details of the procedure were discussed with the patient.  Questions were invited and answered.  Informed consent was obtained.  DESCRIPTION:  The patient was taken to the operating room and placed supine on the operating table.  General endotracheal tube anesthesia was administered by the anesthesiologist.  The patient was positioned and prepped and draped in a standard fashion for direct laryngoscopy.   A mouth guard was placed to protect the dentition. A Dedo laryngoscope was then used to perform the examination. The laryngoscope was inserted via the oral cavity into the pharynx. The vallecula, epiglottis, aryepiglottic folds, and piriform sinuses were all normal. The Dedo laryngoscope was then suspended with the Lewy suspender. The endotracheal tube was removed, exposing the entire glottic opening. The previously noted soft tissue mass was noted to be severely edematous polypoid mucosa overlying the posterior larynx. No discrete  mass was noted. No mucosal ulceration was noted. Photodocumentation of the findings was obtained. A large biopsy specimen was obtained from the polypoid mucosa.  The care of the patient was turned over to the anesthesiologist.  The patient was awakened from anesthesia without difficulty.  She was extubated and transferred to the recovery room in good condition.  OPERATIVE FINDINGS:  Severely edematous and polypoid mucosa of the entire larynx, especially in the posterior region. 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  SPECIMEN:  Posterior laryngeal biopsy.  FOLLOWUP CARE:  The patient will be discharged home once awake and alert.  She will be placed on oxycodone prn pain. She is instructed to continue her omeprazole treatment regimen. The patient will follow up in my office in approximately 1 week.  Ascencion Dike 01/15/2015 12:04 PM

## 2015-01-15 NOTE — H&P (Signed)
Cc: Chronic hoarseness  HPI: The patient is a 59 year old female who presents today complaining of hoarseness for 1-year. The patient is seen in consultation requested by [place]. According to the patient, she was previously evaluated by Dr. Ruby Cola.  She was noted to have vocal cord swelling.  She had her vocal cord biopsied.  She also had a lesion in her nose biopsied by Dr. Simeon Craft.  The pathology was apparently negatively for malignancy. The patient reports worsening of her hoarseness. She denies any significant dysphagia or odynophagia.  She has a history of chronic/allergic rhinitis. She is currently using Afrin nasal spray on a prn basis.  The patient also has a history of gastroesophageal reflux.  She has been taking omeprazole on a prn basis.  The patient has been smoking 1 pack per day for more than 30-years.   The patient's review of systems (constitutional, eyes, ENT, cardiovascular, respiratory, GI, musculoskeletal, skin, neurologic, psychiatric, endocrine, hematologic, allergic) is noted in the ROS questionnaire.  It is reviewed with the patient.  Family health history: Liver cancer and cirrhosis, esophageal cancer.   Major events: Pneumonia as a child, abdominal  hysterectomy, appendectomy.  Ongoing medical problems: Osteoarthritis, GERD, Hepatitis C, IBS, MRSA carrier, fibromyalgia, hypertension, emphysema, anxiety, kidney stones, COPD.   Social history: The patient is divorced. She smokes one half pack of cigarettes a day. She drinks alcohol 2-3 times a week.  She denies the use of illegal drugs.   Exam General: Communicates without difficulty, well nourished, no acute distress. Head: Normocephalic, no evidence injury, no tenderness, facial buttresses intact without stepoff. Eyes: PERRL, EOMI. No scleral icterus, conjunctivae clear. Neuro: CN II exam reveals vision grossly intact.  No nystagmus at any point of gaze. Ears: Auricles well formed without  lesions.  Ear canals are intact without mass or lesion.  No erythema or edema is appreciated.  The TMs are intact without fluid. Nose: External evaluation reveals normal support and skin without lesions.  Dorsum is intact.  Anterior rhinoscopy reveals healthy pink mucosa over anterior aspect of inferior turbinates and intact septum.  No purulence noted. Oral:  Oral cavity and oropharynx are intact, symmetric, without erythema or edema.  Mucosa is moist without lesions. Neck: Full range of motion without pain.  There is no significant lymphadenopathy.  No masses palpable.  Thyroid bed within normal limits to palpation.  Parotid glands and submandibular glands equal bilaterally without mass.  Trachea is midline. Neuro:  CN 2-12 grossly intact. Gait normal.   Procedure:  Flexible Fiberoptic Laryngoscopy Risks, benefits, and alternatives of flexible endoscopy were explained to the patient.  Specific mention was made of the risk of throat numbness with difficulty swallowing, possible bleeding from the nose and mouth, and pain from the procedure.  The patient gave oral consent to proceed.  The nasal cavities were decongested and anesthetised with a combination of oxymetazoline and 4% lidocaine solution.  The flexible scope was inserted into the right nasal cavity and advanced towards the nasopharynx.  Visualized mucosa over the turbinates and septum were normal.  The nasopharynx was clear.  Oropharyngeal walls were symmetric and mobile without lesion, mass, or edema.  Hypopharynx was also without  lesion or edema. A large soft tissue mass was noted in the posterior supraglottic region.  Arytenoid mucosa was severely edematous with slight erythema.  True vocal folds were pale yellow and edematous but without mass or lesion.  Base of tongue was normal.   Assessment 1.  Severe posterior  laryngeal edema is noted on today's fiberoptic laryngoscopy exam.  A large soft tissue mass is also noted in the posterior supraglottic  region.  2.  No other suspicious mass or lesion.   Plan  1.  The physical exam findings and laryngoscopy findings are reviewed with the patient.  2.  Omeprozole 20 mg p.o. b.i.d. daily. The importance of consistent daily use is stressed to the patient.   3.  We will proceed with microdirect laryngoscopy and biopsy of her supraglottic mass.

## 2015-01-16 ENCOUNTER — Encounter (HOSPITAL_BASED_OUTPATIENT_CLINIC_OR_DEPARTMENT_OTHER): Payer: Self-pay | Admitting: Otolaryngology

## 2015-01-23 ENCOUNTER — Ambulatory Visit: Payer: Medicaid Other | Admitting: Family

## 2015-01-23 ENCOUNTER — Other Ambulatory Visit: Payer: Self-pay

## 2015-01-23 ENCOUNTER — Telehealth: Payer: Self-pay | Admitting: Family

## 2015-01-23 MED ORDER — FUROSEMIDE 40 MG PO TABS
40.0000 mg | ORAL_TABLET | Freq: Every day | ORAL | Status: DC
Start: 2015-01-23 — End: 2015-02-23

## 2015-01-24 ENCOUNTER — Telehealth: Payer: Self-pay | Admitting: Family

## 2015-01-25 ENCOUNTER — Ambulatory Visit (INDEPENDENT_AMBULATORY_CARE_PROVIDER_SITE_OTHER): Payer: Medicaid Other | Admitting: Otolaryngology

## 2015-01-25 DIAGNOSIS — J381 Polyp of vocal cord and larynx: Secondary | ICD-10-CM | POA: Diagnosis not present

## 2015-01-26 NOTE — Telephone Encounter (Signed)
Information given to nurse at [facility] for her appointment with Dr. Corwin Levins on 02/19/2015

## 2015-01-29 ENCOUNTER — Other Ambulatory Visit: Payer: Self-pay | Admitting: Nurse Practitioner

## 2015-01-31 ENCOUNTER — Other Ambulatory Visit: Payer: Self-pay | Admitting: *Deleted

## 2015-01-31 ENCOUNTER — Other Ambulatory Visit: Payer: Self-pay | Admitting: Nurse Practitioner

## 2015-01-31 ENCOUNTER — Telehealth: Payer: Self-pay | Admitting: Nurse Practitioner

## 2015-01-31 MED ORDER — LACTULOSE 10 GM/15ML PO SOLN: ORAL | Status: DC

## 2015-01-31 NOTE — Telephone Encounter (Signed)
Rx sent into [facility]

## 2015-02-12 ENCOUNTER — Telehealth: Payer: Self-pay | Admitting: Family

## 2015-02-23 ENCOUNTER — Other Ambulatory Visit: Payer: Self-pay | Admitting: Family

## 2015-02-28 ENCOUNTER — Inpatient Hospital Stay (HOSPITAL_COMMUNITY)
Admission: EM | Admit: 2015-02-28 | Discharge: 2015-03-01 | DRG: 434 | Disposition: A | Discharge status: Home / Self Care | Payer: Medicaid Other | Attending: Internal Medicine | Admitting: Internal Medicine

## 2015-02-28 ENCOUNTER — Emergency Department (HOSPITAL_COMMUNITY): Payer: Medicaid Other

## 2015-02-28 ENCOUNTER — Encounter (HOSPITAL_COMMUNITY): Payer: Self-pay | Admitting: Cardiology

## 2015-02-28 DIAGNOSIS — R140 Abdominal distension (gaseous): Secondary | ICD-10-CM | POA: Diagnosis present

## 2015-02-28 DIAGNOSIS — E876 Hypokalemia: Secondary | ICD-10-CM | POA: Diagnosis present

## 2015-02-28 DIAGNOSIS — IMO0001 Reserved for inherently not codable concepts without codable children: Secondary | ICD-10-CM | POA: Diagnosis present

## 2015-02-28 DIAGNOSIS — K7460 Unspecified cirrhosis of liver: Secondary | ICD-10-CM | POA: Diagnosis not present

## 2015-02-28 DIAGNOSIS — F17210 Nicotine dependence, cigarettes, uncomplicated: Secondary | ICD-10-CM | POA: Diagnosis present

## 2015-02-28 DIAGNOSIS — K219 Gastro-esophageal reflux disease without esophagitis: Secondary | ICD-10-CM | POA: Diagnosis present

## 2015-02-28 DIAGNOSIS — D696 Thrombocytopenia, unspecified: Secondary | ICD-10-CM

## 2015-02-28 DIAGNOSIS — J449 Chronic obstructive pulmonary disease, unspecified: Secondary | ICD-10-CM | POA: Diagnosis present

## 2015-02-28 DIAGNOSIS — R188 Other ascites: Secondary | ICD-10-CM | POA: Diagnosis present

## 2015-02-28 DIAGNOSIS — I10 Essential (primary) hypertension: Secondary | ICD-10-CM | POA: Diagnosis present

## 2015-02-28 DIAGNOSIS — M797 Fibromyalgia: Secondary | ICD-10-CM | POA: Diagnosis present

## 2015-02-28 DIAGNOSIS — M1990 Unspecified osteoarthritis, unspecified site: Secondary | ICD-10-CM | POA: Diagnosis present

## 2015-02-28 DIAGNOSIS — K7031 Alcoholic cirrhosis of liver with ascites: Principal | ICD-10-CM | POA: Diagnosis present

## 2015-02-28 DIAGNOSIS — R161 Splenomegaly, not elsewhere classified: Secondary | ICD-10-CM

## 2015-02-28 DIAGNOSIS — R740 Nonspecific elevation of levels of transaminase and lactic acid dehydrogenase [LDH]: Secondary | ICD-10-CM

## 2015-02-28 DIAGNOSIS — F1020 Alcohol dependence, uncomplicated: Secondary | ICD-10-CM | POA: Diagnosis present

## 2015-02-28 DIAGNOSIS — R109 Unspecified abdominal pain: Secondary | ICD-10-CM

## 2015-02-28 DIAGNOSIS — B182 Chronic viral hepatitis C: Secondary | ICD-10-CM | POA: Diagnosis present

## 2015-02-28 DIAGNOSIS — R7401 Elevation of levels of liver transaminase levels: Secondary | ICD-10-CM

## 2015-02-28 LAB — URINALYSIS, ROUTINE W REFLEX MICROSCOPIC
Bilirubin Urine: NEGATIVE
Glucose, UA: NEGATIVE mg/dL
Hgb urine dipstick: NEGATIVE
KETONES UR: NEGATIVE mg/dL
Leukocytes, UA: NEGATIVE
Nitrite: NEGATIVE
PH: 6 (ref 5.0–8.0)
Protein, ur: NEGATIVE mg/dL
Specific Gravity, Urine: 1.02 (ref 1.005–1.030)
Urobilinogen, UA: 0.2 mg/dL (ref 0.0–1.0)

## 2015-02-28 LAB — CBC
HCT: 35.9 % — ABNORMAL LOW (ref 36.0–46.0)
HEMOGLOBIN: 11.8 g/dL — AB (ref 12.0–15.0)
MCH: 30.7 pg (ref 26.0–34.0)
MCHC: 32.9 g/dL (ref 30.0–36.0)
MCV: 93.5 fL (ref 78.0–100.0)
Platelets: 70 10*3/uL — ABNORMAL LOW (ref 150–400)
RBC: 3.84 MIL/uL — ABNORMAL LOW (ref 3.87–5.11)
RDW: 14.9 % (ref 11.5–15.5)
WBC: 4 10*3/uL (ref 4.0–10.5)

## 2015-02-28 LAB — COMPREHENSIVE METABOLIC PANEL
ALBUMIN: 2.6 g/dL — AB (ref 3.5–5.0)
ALT: 93 U/L — AB (ref 14–54)
ANION GAP: 7 (ref 5–15)
AST: 208 U/L — ABNORMAL HIGH (ref 15–41)
Alkaline Phosphatase: 148 U/L — ABNORMAL HIGH (ref 38–126)
BILIRUBIN TOTAL: 2.3 mg/dL — AB (ref 0.3–1.2)
BUN: 8 mg/dL (ref 6–20)
CO2: 25 mmol/L (ref 22–32)
Calcium: 7.7 mg/dL — ABNORMAL LOW (ref 8.9–10.3)
Chloride: 105 mmol/L (ref 101–111)
Creatinine, Ser: 0.66 mg/dL (ref 0.44–1.00)
Glucose, Bld: 248 mg/dL — ABNORMAL HIGH (ref 65–99)
POTASSIUM: 3.2 mmol/L — AB (ref 3.5–5.1)
Sodium: 137 mmol/L (ref 135–145)
TOTAL PROTEIN: 6.9 g/dL (ref 6.5–8.1)

## 2015-02-28 LAB — LIPASE, BLOOD: Lipase: 39 U/L (ref 22–51)

## 2015-02-28 LAB — AMMONIA: Ammonia: 60 umol/L — ABNORMAL HIGH (ref 9–35)

## 2015-02-28 MED ORDER — FENTANYL CITRATE (PF) 100 MCG/2ML IJ SOLN
50.0000 ug | Freq: Once | INTRAMUSCULAR | Status: AC
  Administered 2015-02-28: 50 ug via INTRAVENOUS
  Filled 2015-02-28: qty 2

## 2015-02-28 MED ORDER — POTASSIUM CHLORIDE 10 MEQ/100ML IV SOLN
10.0000 meq | INTRAVENOUS | Status: DC
  Administered 2015-02-28: 10 meq via INTRAVENOUS
  Filled 2015-02-28: qty 100

## 2015-02-28 MED ORDER — LACTULOSE 10 GM/15ML PO SOLN
10.0000 g | Freq: Two times a day (BID) | ORAL | Status: DC
  Administered 2015-02-28 – 2015-03-01 (×2): 10 g via ORAL
  Filled 2015-02-28 (×2): qty 30

## 2015-02-28 MED ORDER — MORPHINE SULFATE 4 MG/ML IJ SOLN
4.0000 mg | Freq: Once | INTRAMUSCULAR | Status: AC
  Administered 2015-02-28: 4 mg via INTRAVENOUS
  Filled 2015-02-28: qty 1

## 2015-02-28 MED ORDER — OXYCODONE HCL 5 MG PO TABS
5.0000 mg | ORAL_TABLET | ORAL | Status: DC | PRN
  Administered 2015-02-28 – 2015-03-01 (×3): 5 mg via ORAL
  Filled 2015-02-28 (×3): qty 1

## 2015-02-28 MED ORDER — IOHEXOL 300 MG/ML  SOLN
50.0000 mL | Freq: Once | INTRAMUSCULAR | Status: AC | PRN
  Administered 2015-02-28: 50 mL via ORAL

## 2015-02-28 MED ORDER — PNEUMOCOCCAL VAC POLYVALENT 25 MCG/0.5ML IJ INJ
0.5000 mL | INJECTION | INTRAMUSCULAR | Status: AC
  Administered 2015-03-01: 0.5 mL via INTRAMUSCULAR
  Filled 2015-02-28: qty 0.5

## 2015-02-28 MED ORDER — SODIUM CHLORIDE 0.9 % IV SOLN: 250.0000 mL | INTRAVENOUS | Status: DC | PRN

## 2015-02-28 MED ORDER — POTASSIUM CHLORIDE CRYS ER 20 MEQ PO TBCR
40.0000 meq | EXTENDED_RELEASE_TABLET | Freq: Once | ORAL | Status: AC
  Administered 2015-02-28: 40 meq via ORAL
  Filled 2015-02-28: qty 2

## 2015-02-28 MED ORDER — IOHEXOL 300 MG/ML  SOLN
100.0000 mL | Freq: Once | INTRAMUSCULAR | Status: AC | PRN
  Administered 2015-02-28: 100 mL via INTRAVENOUS

## 2015-02-28 MED ORDER — ONDANSETRON HCL 4 MG/2ML IJ SOLN
4.0000 mg | Freq: Once | INTRAMUSCULAR | Status: AC
  Administered 2015-02-28: 4 mg via INTRAVENOUS
  Filled 2015-02-28: qty 2

## 2015-02-28 MED ORDER — FUROSEMIDE 10 MG/ML IJ SOLN
20.0000 mg | Freq: Two times a day (BID) | INTRAMUSCULAR | Status: DC
  Administered 2015-02-28 – 2015-03-01 (×2): 20 mg via INTRAVENOUS
  Filled 2015-02-28 (×2): qty 2

## 2015-02-28 MED ORDER — SODIUM CHLORIDE 0.9 % IV BOLUS (SEPSIS)
500.0000 mL | Freq: Once | INTRAVENOUS | Status: AC
  Administered 2015-02-28: 500 mL via INTRAVENOUS

## 2015-02-28 MED ORDER — CETIRIZINE HCL 10 MG PO TABS: 10.0000 mg | ORAL_TABLET | Freq: Every day | ORAL | Status: DC

## 2015-02-28 MED ORDER — SODIUM CHLORIDE 0.9 % IJ SOLN
3.0000 mL | Freq: Two times a day (BID) | INTRAMUSCULAR | Status: DC
  Administered 2015-02-28 – 2015-03-01 (×2): 3 mL via INTRAVENOUS

## 2015-02-28 MED ORDER — ONDANSETRON HCL 4 MG/2ML IJ SOLN: 4.0000 mg | Freq: Three times a day (TID) | INTRAMUSCULAR | Status: AC | PRN

## 2015-02-28 MED ORDER — LORATADINE 10 MG PO TABS
10.0000 mg | ORAL_TABLET | Freq: Every day | ORAL | Status: DC
  Administered 2015-03-01: 10 mg via ORAL
  Filled 2015-02-28: qty 1

## 2015-02-28 MED ORDER — SODIUM CHLORIDE 0.9 % IJ SOLN: 3.0000 mL | INTRAMUSCULAR | Status: DC | PRN

## 2015-02-28 NOTE — H&P (Signed)
PCP:   Sharion Balloon, FNP   Chief Complaint:  Abdominal distention  HPI: 59 year old female who   has a past medical history of Osteoarthritis; Hep C w/o coma, chronic; Emphysema; GERD (gastroesophageal reflux disease); IBS (irritable bowel syndrome); Psoriasis; MRSA carrier; Fibromyalgia; Shortness of breath; Hypertension; Pneumonia; Anxiety; Kidney stones; Depression; Cirrhosis of liver; and Varicose vein. Today presents to the hospital, with chief complaint of abdominal distention which started for past 34 days. Patient has a history of alcohol-induced cirrhosis, with hepatitis C, esophageal paralysis and prior history of abdominal ascites. Never had paracentesis before. Patient says that she is having low back pain and also difficulty breathing, dyspnea on exertion. She denies any fever, no chest pain. No nausea vomiting or diarrhea recently. Patient has seen Dr. Laural Golden in the past. She denies abdominal pain. No fever. In the ED patient was found to have normal white count afebrile.   Allergies:   Allergies  Allergen Reactions  . Codeine Itching  . Hydrocodone-Acetaminophen Itching  . Meloxicam Swelling    Face swelling  . Montelukast Cough      Past Medical History  Diagnosis Date  . Osteoarthritis   . Hep C w/o coma, chronic   . Emphysema   . GERD (gastroesophageal reflux disease)   . IBS (irritable bowel syndrome)   . 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 MRSA carrier   . Fibromyalgia   . Shortness of breath     with exertion  . Hypertension   . Pneumonia     As a child  . Anxiety   . Kidney stones   . Depression   . Cirrhosis of liver   . Varicose vein     Of esophagus and top of stomach per pt    Past Surgical History  Procedure Laterality Date  . Hysteroscopy    . Appendectomy    . Abdominal hysterectomy    . Diagnostic laparoscopy      exploratory lap  . Colonoscopy w/ biopsies and polypectomy    . Direct laryngoscopy N/A 03/22/2014    Procedure:  DIRECT LARYNGOSCOPY;  Surgeon: Ruby Cola, MD;  Location: [facility];  Service: ENT;  Laterality: N/A;  With biopsy  . Esophagogastroduodenoscopy (egd) with propofol N/A 07/26/2014    Procedure: ESOPHAGOGASTRODUODENOSCOPY (EGD) WITH PROPOFOL;  Surgeon: Rogene Houston, MD;  Location: [facility];  Service: Endoscopy;  Laterality: N/A;  . Direct laryngoscopy N/A 01/15/2015    Procedure: DIRECT LARYNGOSCOPY WITH BIOPSY;  Surgeon: Leta Baptist, MD;  Location: [facility];  Service: ENT;  Laterality: N/A;    Prior to Admission medications   Medication Sig Start Date End Date Taking? Authorizing Provider  albuterol (PROVENTIL HFA) 108 (90 BASE) MCG/ACT inhaler INHALE 2 PUFFS INTO THE LUNGS EVERY FOUR HOURS AS NEEDED FOR WHEEZING. 01/12/15  Yes Sharion Balloon, FNP  albuterol (PROVENTIL) (2.5 MG/3ML) 0.083% nebulizer solution Take 3 mLs (2.5 mg total) by nebulization every 6 (six) hours as needed for wheezing or shortness of breath. 11/27/14  Yes Mary-Margaret Hassell Done, FNP  B-Complex TABS Take 1 tablet by mouth daily. 07/11/14  Yes Rogene Houston, MD  calcium carbonate (OS-CAL - DOSED IN MG OF ELEMENTAL CALCIUM) 1250 (500 CA) MG tablet Take 1 tablet by mouth daily with breakfast.   Yes Historical Provider, MD  Cetirizine HCl (ZYRTEC ALLERGY PO) Take 1 tablet by mouth daily.    Yes Historical Provider, MD  dicyclomine (BENTYL) 20 MG tablet Take 1  tablet (20 mg total) by mouth every 6 (six) hours. 10/11/14  Yes Sharion Balloon, FNP  furosemide (LASIX) 40 MG tablet TAKE 1 TABLET BY MOUTH ONCE DAILY. 02/23/15  Yes Sharion Balloon, FNP  ibuprofen (ADVIL,MOTRIN) 800 MG tablet Take 800 mg by mouth every 8 (eight) hours as needed for moderate pain.    Yes Historical Provider, MD  lactulose (CHRONULAC) 10 GM/15ML solution TAKE 15MLs BY MOUTH TWICE DAILY. 01/31/15  Yes Sharion Balloon, FNP  Multiple Vitamin (MULTIVITAMIN WITH MINERALS) TABS tablet Take 1 tablet by mouth daily.   Yes Historical Provider, MD  omeprazole  (PRILOSEC) 20 MG capsule Take 20 mg by mouth 2 (two) times daily.   Yes Historical Provider, MD  OxyCODONE HCl, Abuse Deter, (OXAYDO) 5 MG TABA Take 5 mg by mouth every 4 (four) hours as needed (pain). Patient not taking: Reported on 02/28/2015 01/15/15   Leta Baptist, MD    Social History:  reports that she has been smoking Cigarettes.  She has a 20 pack-year smoking history. She has never used smokeless tobacco. She reports that she drinks about 2.4 oz of alcohol per week. She reports that she uses illicit drugs (Cocaine and "Crack" cocaine).  Family History  Problem Relation Age of Onset  . Cancer Mother     esophageal  . Cancer Brother     Liver cancer and cirrhosis age 51    Filed Weights   02/28/15 1110  Weight: 64.365 kg (141 lb 14.4 oz)    All the positives are listed in BOLD  Review of Systems:  HEENT: Headache, blurred vision, runny nose, sore throat Neck: Hypothyroidism, hyperthyroidism,,lymphadenopathy Chest : Shortness of breath, history of COPD, Asthma Heart : Chest pain, history of coronary arterey disease GI:  Nausea, vomiting, diarrhea, constipation, GERD GU: Dysuria, urgency, frequency of urination, hematuria Neuro: Stroke, seizures, syncope Psych: Depression, anxiety, hallucinations   Physical Exam: Blood pressure 111/84, pulse 89, temperature 97.8 F (36.6 C), temperature source Oral, resp. rate 16, height 5\' 2"  (1.575 m), weight 64.365 kg (141 lb 14.4 oz), SpO2 100 %. Constitutional:   Patient is a well-developed and well-nourished female* in no acute distress and cooperative with exam. Head: Normocephalic and atraumatic Mouth: Mucus membranes moist Eyes: PERRL, EOMI, conjunctivae normal Neck: Supple, No Thyromegaly Cardiovascular: RRR, S1 normal, S2 normal Pulmonary/Chest: CTAB, no wheezes, rales, or rhonchi Abdominal: Soft. Nontender to palpation, distended, positive fluid thrill , bowel sounds are normal, no masses, organomegaly, or guarding present.    Neurological: A&O x3, Strength is normal and symmetric bilaterally, cranial nerve II-XII are grossly intact, no focal motor deficit, sensory intact to light touch bilaterally.  Extremities : No Cyanosis, Clubbing or Edema  Labs on Admission:  Basic Metabolic Panel:  Recent Labs Lab 02/28/15 1130  NA 137  K 3.2*  CL 105  CO2 25  GLUCOSE 248*  BUN 8  CREATININE 0.66  CALCIUM 7.7*   Liver Function Tests:  Recent Labs Lab 02/28/15 1130  AST 208*  ALT 93*  ALKPHOS 148*  BILITOT 2.3*  PROT 6.9  ALBUMIN 2.6*    Recent Labs Lab 02/28/15 1130  LIPASE 39    Recent Labs Lab 02/28/15 1207  AMMONIA 60*   CBC:  Recent Labs Lab 02/28/15 1130  WBC 4.0  HGB 11.8*  HCT 35.9*  MCV 93.5  PLT 70*   Radiological Exams on Admission: US Abdomen Complete  02/28/2015   CLINICAL DATA:  Abdominal distention for 3-4 days, history of hepatitis-C and  cirrhosis  EXAM: ULTRASOUND ABDOMEN COMPLETE  COMPARISON:  CT abdomen and pelvis of 06/13/2014  FINDINGS: Gallbladder: The gallbladder is visualized and no gallstones are seen. However there is a 4 mm echogenic focus adherent to the wall most consistent with a small polyp. The gallbladder wall is thickened measuring 9 mm, most likely due to hypoproteinemia in this cirrhotic patient. There is no pain over the gallbladder with compression.  Common bile duct: Diameter: The common bile duct is normal measuring 5.8 mm.  Liver: The liver is echogenic and nodular consistent with changes of cirrhosis. Recannulized umbilical vein again is noted as described on prior CT. No focal hepatic lesion is seen however.  IVC: No abnormality visualized.  Pancreas: Visualized portion unremarkable.  Spleen: Splenomegaly is noted with the spleen measuring 11.7 cm, and volume of 711 cubic cm.  Right Kidney: Length: 11.1 cm.  No hydronephrosis is seen.  Left Kidney: Length: 11.7 cm.  No hydronephrosis is seen.  Abdominal aorta: The abdominal aorta is normal caliber  with the bifurcation obscured by bowel gas.  Other findings: A small amount of ascites is present.  IMPRESSION: 1. Changes of cirrhosis as noted previously. No focal hepatic lesion is seen. 2. Thickened gallbladder wall most consistent with hypoproteinemia. No pain is present over the gallbladder with compression and no gallstones are noted. 3. Splenomegaly. 4. Small amount of ascites.   Electronically Signed   By: Ivar Drape M.D.   On: 02/28/2015 13:50   Ct Abdomen Pelvis W Contrast  02/28/2015   CLINICAL DATA:  Abdominal pain and swelling for 3 days. Hepatitis-C.  EXAM: CT ABDOMEN AND PELVIS WITH CONTRAST  TECHNIQUE: Multidetector CT imaging of the abdomen and pelvis was performed using the standard protocol following bolus administration of intravenous contrast.  CONTRAST:  74mL OMNIPAQUE IOHEXOL 300 MG/ML SOLN, 166mL OMNIPAQUE IOHEXOL 300 MG/ML SOLN  COMPARISON:  CT 06/13/2014  FINDINGS: Lower chest: Lung bases are clear.  Hepatobiliary: The liver has a shrunken nodular contour. Portal veins are patent. Gallbladder is normal. Evidence of portal hypertension with recanalization of the umbilical vein, paraesophageal varices, venous collaterals in the lower pelvis and ventral peritoneal space.  Pancreas: Pancreas is normal. No ductal dilatation. No pancreatic inflammation.  Spleen: Mild splenomegaly.  The splenic vein is patent.  Adrenals/urinary tract: Adrenal glands and kidneys are normal. The ureters and bladder normal.  Stomach/Bowel: Stomach and small bowel are normal. Diverticula of descending colon without acute inflammation.  Vascular/Lymphatic: Abdominal aorta is normal caliber. There is no retroperitoneal or periportal lymphadenopathy. No pelvic lymphadenopathy.  Reproductive: Post hysterectomy.  Musculoskeletal: No aggressive osseous lesion  Other: Moderate volume of ascites surrounding the liver spleen and within the pelvis is increased from comparison exam.  IMPRESSION: 1. Moderate volume ascites  increased from CT 06/13/2014. 2. Cirrhosis an dportal hypertension with multiple venous collaterals varices 3. No enhancing hepatic lesion identified.   Electronically Signed   By: Suzy Bouchard M.D.   On: 02/28/2015 17:12     Assessment/Plan Active Problems:   Cirrhosis of liver   COPD bronchitis   Alcoholism /alcohol abuse   Ascites due to alcoholic cirrhosis   Ascites  Ascites Patient has worsening of abdominal distention, with dyspnea on exertion. Will start Lasix 20mg  IV every 12 hours. IR guided paracentesis in a.m. No concern for  Sbp, will not order labs unless patient becomes febrile or there is a concern for SBP in the morning.  Liver cirrhosis Patient has history of hepatitis C and alcohol liver  disease. Continue furosemide, lactulose  COPD Stable Continue when necessary albuterol  Back pain Secondary to ascites Continue oxycodone when necessary  Hypokalemia Replace potassium Check BMP in a.m.  DVT prophylaxis SCDs, will hold Lovenox as patient is due for IR guided past and this is in a.m.  Code status: Full code  Family discussion: No family at bedside  Time Spent on Admission: 60 minutes  [facility] Pager: [phone] 02/28/2015, 7:28 PM  If 7PM-7AM, please contact night-coverage  [email or web address]  Password [email or web address]

## 2015-02-28 NOTE — ED Provider Notes (Signed)
CSN: [id number]     Arrival date & time 02/28/15  1058 History   First MD Initiated Contact with Patient 02/28/15 1116     Chief Complaint  Patient presents with  . Abdominal Pain     (Consider location/radiation/quality/duration/timing/severity/associated sxs/prior Treatment) The history is provided by the patient.   Gurbani Figge is a 59 y.o. female with a history significant for alcohol induced cirrhosis with hepatitis C, also with esophageal varices and prior history of abdominal ascites presenting with a 3-4 day course of worsening abdominal distention and pressure which she states is consistent with prior ascites problems.  She denies nausea, vomiting, constipation or diarrhea, her last bm was yesterday and normal.  She does endorse difficulty breathing, not sob, but difficulty taking a deep breath due to abdominal pressure.  She has found no alleviators for her symptoms.  Additionally she denies back pain, chest pain, peripheral edema.  She does endorse has been bruising easily lately and has had a few controlled nose bleeds the past week.  She has seen Dr. Laural Golden in the past but not currently as she continues to drink etoh (last drank yesterday) so she is not a candidate for hepatitis C treatment.  Her pcp is Dr. Lenna Gilford.    Past Medical History  Diagnosis Date  . Osteoarthritis   . Hep C w/o coma, chronic   . Emphysema   . GERD (gastroesophageal reflux disease)   . IBS (irritable bowel syndrome)   . 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 MRSA carrier   . Fibromyalgia   . Shortness of breath     with exertion  . Hypertension   . Pneumonia     As a child  . Anxiety   . Kidney stones   . Depression   . Cirrhosis of liver   . Varicose vein     Of esophagus and top of stomach per pt   Past Surgical History  Procedure Laterality Date  . Hysteroscopy    . Appendectomy    . Abdominal hysterectomy    . Diagnostic laparoscopy      exploratory lap  . Colonoscopy w/ biopsies and  polypectomy    . Direct laryngoscopy N/A 03/22/2014    Procedure: DIRECT LARYNGOSCOPY;  Surgeon: Ruby Cola, MD;  Location: [facility];  Service: ENT;  Laterality: N/A;  With biopsy  . Esophagogastroduodenoscopy (egd) with propofol N/A 07/26/2014    Procedure: ESOPHAGOGASTRODUODENOSCOPY (EGD) WITH PROPOFOL;  Surgeon: Rogene Houston, MD;  Location: [facility];  Service: Endoscopy;  Laterality: N/A;  . Direct laryngoscopy N/A 01/15/2015    Procedure: DIRECT LARYNGOSCOPY WITH BIOPSY;  Surgeon: Leta Baptist, MD;  Location: [facility];  Service: ENT;  Laterality: N/A;   Family History  Problem Relation Age of Onset  . Cancer Mother     esophageal  . Cancer Brother     Liver cancer and cirrhosis age 55   History  Substance Use Topics  . Smoking status: Current Every Day Smoker -- 0.50 packs/day for 40 years    Types: Cigarettes  . Smokeless tobacco: Never Used     Comment: 1/2 pack a day at least 40 yrs  . Alcohol Use: 2.4 oz/week    4 Cans of beer per week     Comment: 1 smirnoff a day.    OB History    No data available     Review of Systems  Constitutional: Negative for fever and chills.  HENT: Negative  for congestion and sore throat.   Eyes: Negative.   Respiratory: Negative for chest tightness and shortness of breath.   Cardiovascular: Negative for chest pain.  Gastrointestinal: Positive for abdominal pain and abdominal distention. Negative for nausea, vomiting and diarrhea.  Genitourinary: Negative.  Negative for dysuria, hematuria and vaginal discharge.  Musculoskeletal: Negative for joint swelling, arthralgias and neck pain.  Skin: Negative.  Negative for rash and wound.  Neurological: Negative for dizziness, weakness, light-headedness, numbness and headaches.  Psychiatric/Behavioral: Negative.       Allergies  Codeine; Hydrocodone-acetaminophen; Meloxicam; and Montelukast  Home Medications   Prior to Admission medications   Medication Sig Start Date End Date  Taking? Authorizing Provider  albuterol (PROVENTIL HFA) 108 (90 BASE) MCG/ACT inhaler INHALE 2 PUFFS INTO THE LUNGS EVERY FOUR HOURS AS NEEDED FOR WHEEZING. 01/12/15  Yes Sharion Balloon, FNP  albuterol (PROVENTIL) (2.5 MG/3ML) 0.083% nebulizer solution Take 3 mLs (2.5 mg total) by nebulization every 6 (six) hours as needed for wheezing or shortness of breath. 11/27/14  Yes Mary-Margaret Hassell Done, FNP  B-Complex TABS Take 1 tablet by mouth daily. 07/11/14  Yes Rogene Houston, MD  calcium carbonate (OS-CAL - DOSED IN MG OF ELEMENTAL CALCIUM) 1250 (500 CA) MG tablet Take 1 tablet by mouth daily with breakfast.   Yes Historical Provider, MD  Cetirizine HCl (ZYRTEC ALLERGY PO) Take 1 tablet by mouth daily.    Yes Historical Provider, MD  dicyclomine (BENTYL) 20 MG tablet Take 1 tablet (20 mg total) by mouth every 6 (six) hours. 10/11/14  Yes Sharion Balloon, FNP  furosemide (LASIX) 40 MG tablet TAKE 1 TABLET BY MOUTH ONCE DAILY. 02/23/15  Yes Sharion Balloon, FNP  ibuprofen (ADVIL,MOTRIN) 800 MG tablet Take 800 mg by mouth every 8 (eight) hours as needed for moderate pain.    Yes Historical Provider, MD  lactulose (CHRONULAC) 10 GM/15ML solution TAKE 15MLs BY MOUTH TWICE DAILY. 01/31/15  Yes Sharion Balloon, FNP  Multiple Vitamin (MULTIVITAMIN WITH MINERALS) TABS tablet Take 1 tablet by mouth daily.   Yes Historical Provider, MD  omeprazole (PRILOSEC) 20 MG capsule Take 20 mg by mouth 2 (two) times daily.   Yes Historical Provider, MD  OxyCODONE HCl, Abuse Deter, (OXAYDO) 5 MG TABA Take 5 mg by mouth every 4 (four) hours as needed (pain). Patient not taking: Reported on 02/28/2015 01/15/15   Leta Baptist, MD   BP 126/70 mmHg  Pulse 88  Temp(Src) 97.8 F (36.6 C) (Oral)  Resp 20  Ht 5\' 2"  (1.575 m)  Wt 141 lb 14.4 oz (64.365 kg)  BMI 25.95 kg/m2  SpO2 98% Physical Exam  Constitutional: She appears well-developed and well-nourished. No distress.  HENT:  Head: Normocephalic and atraumatic.  Eyes: Conjunctivae  are normal.  Neck: Normal range of motion.  Cardiovascular: Normal rate, regular rhythm, normal heart sounds and intact distal pulses.   Pulmonary/Chest: Effort normal and breath sounds normal. She has no wheezes.  Abdominal: Soft. Bowel sounds are normal. She exhibits distension. She exhibits no ascites and no mass. There is no hepatosplenomegaly. There is tenderness. There is no rebound, no guarding and no CVA tenderness.  Mild generalized ttp.  No guard or rebound.  No appreciable fluid wave.  Musculoskeletal: Normal range of motion.  Neurological: She is alert.  Skin: Skin is warm and dry.  Psychiatric: She has a normal mood and affect.  Nursing note and vitals reviewed.   ED Course  Procedures (including critical care time) Labs Review  Labs Reviewed  COMPREHENSIVE METABOLIC PANEL - Abnormal; Notable for the following:    Potassium 3.2 (*)    Glucose, Bld 248 (*)    Calcium 7.7 (*)    Albumin 2.6 (*)    AST 208 (*)    ALT 93 (*)    Alkaline Phosphatase 148 (*)    Total Bilirubin 2.3 (*)    All other components within normal limits  CBC - Abnormal; Notable for the following:    RBC 3.84 (*)    Hemoglobin 11.8 (*)    HCT 35.9 (*)    Platelets 70 (*)    All other components within normal limits  AMMONIA - Abnormal; Notable for the following:    Ammonia 60 (*)    All other components within normal limits  LIPASE, BLOOD  URINALYSIS, ROUTINE W REFLEX MICROSCOPIC (NOT AT [facility])    Imaging Review US Abdomen Complete  02/28/2015   CLINICAL DATA:  Abdominal distention for 3-4 days, history of hepatitis-C and cirrhosis  EXAM: ULTRASOUND ABDOMEN COMPLETE  COMPARISON:  CT abdomen and pelvis of 06/13/2014  FINDINGS: Gallbladder: The gallbladder is visualized and no gallstones are seen. However there is a 4 mm echogenic focus adherent to the wall most consistent with a small polyp. The gallbladder wall is thickened measuring 9 mm, most likely due to hypoproteinemia in this cirrhotic  patient. There is no pain over the gallbladder with compression.  Common bile duct: Diameter: The common bile duct is normal measuring 5.8 mm.  Liver: The liver is echogenic and nodular consistent with changes of cirrhosis. Recannulized umbilical vein again is noted as described on prior CT. No focal hepatic lesion is seen however.  IVC: No abnormality visualized.  Pancreas: Visualized portion unremarkable.  Spleen: Splenomegaly is noted with the spleen measuring 11.7 cm, and volume of 711 cubic cm.  Right Kidney: Length: 11.1 cm.  No hydronephrosis is seen.  Left Kidney: Length: 11.7 cm.  No hydronephrosis is seen.  Abdominal aorta: The abdominal aorta is normal caliber with the bifurcation obscured by bowel gas.  Other findings: A small amount of ascites is present.  IMPRESSION: 1. Changes of cirrhosis as noted previously. No focal hepatic lesion is seen. 2. Thickened gallbladder wall most consistent with hypoproteinemia. No pain is present over the gallbladder with compression and no gallstones are noted. 3. Splenomegaly. 4. Small amount of ascites.   Electronically Signed   By: Ivar Drape M.D.   On: 02/28/2015 13:50   Ct Abdomen Pelvis W Contrast  02/28/2015   CLINICAL DATA:  Abdominal pain and swelling for 3 days. Hepatitis-C.  EXAM: CT ABDOMEN AND PELVIS WITH CONTRAST  TECHNIQUE: Multidetector CT imaging of the abdomen and pelvis was performed using the standard protocol following bolus administration of intravenous contrast.  CONTRAST:  39mL OMNIPAQUE IOHEXOL 300 MG/ML SOLN, 151mL OMNIPAQUE IOHEXOL 300 MG/ML SOLN  COMPARISON:  CT 06/13/2014  FINDINGS: Lower chest: Lung bases are clear.  Hepatobiliary: The liver has a shrunken nodular contour. Portal veins are patent. Gallbladder is normal. Evidence of portal hypertension with recanalization of the umbilical vein, paraesophageal varices, venous collaterals in the lower pelvis and ventral peritoneal space.  Pancreas: Pancreas is normal. No ductal  dilatation. No pancreatic inflammation.  Spleen: Mild splenomegaly.  The splenic vein is patent.  Adrenals/urinary tract: Adrenal glands and kidneys are normal. The ureters and bladder normal.  Stomach/Bowel: Stomach and small bowel are normal. Diverticula of descending colon without acute inflammation.  Vascular/Lymphatic: Abdominal aorta is normal caliber.  There is no retroperitoneal or periportal lymphadenopathy. No pelvic lymphadenopathy.  Reproductive: Post hysterectomy.  Musculoskeletal: No aggressive osseous lesion  Other: Moderate volume of ascites surrounding the liver spleen and within the pelvis is increased from comparison exam.  IMPRESSION: 1. Moderate volume ascites increased from CT 06/13/2014. 2. Cirrhosis an dportal hypertension with multiple venous collaterals varices 3. No enhancing hepatic lesion identified.   Electronically Signed   By: Suzy Bouchard M.D.   On: 02/28/2015 17:12     EKG Interpretation None      MDM   Final diagnoses:  Abdominal distention  Abdominal pain  Elevated transaminase level  Thrombocytopenia  Splenomegaly  Ascites    Patients labs and/or radiological studies were reviewed and considered during the medical decision making and disposition process.  Results were also discussed with patient. Pt with worsening ascites with known cirrhosis, abdominal pain and distention.  Elevated LFT's, thrombocytopenia, hyperglycemia (no h/o DM).  Mildly elevatede ammonia (no clinical encephalopathy).  Will request admission.  6:25 PM Still waiting return call from Hospitalist.  Pt signed out to Dr. Thurnell Garbe who will discuss with hospitalist for admission.      Evalee Jefferson, PA-C 02/28/15 1826  Milton Ferguson, MD 03/12/15 [phone]

## 2015-02-28 NOTE — ED Notes (Signed)
No abdominal pain at this time.  C/o chronic lower back pain.

## 2015-02-28 NOTE — ED Notes (Signed)
C/o abdominal pain and swelling for 3-4 days.

## 2015-03-01 ENCOUNTER — Inpatient Hospital Stay (HOSPITAL_COMMUNITY): Payer: Medicaid Other

## 2015-03-01 DIAGNOSIS — F1020 Alcohol dependence, uncomplicated: Secondary | ICD-10-CM

## 2015-03-01 LAB — COMPREHENSIVE METABOLIC PANEL
ALK PHOS: 125 U/L (ref 38–126)
ALT: 77 U/L — ABNORMAL HIGH (ref 14–54)
AST: 162 U/L — AB (ref 15–41)
Albumin: 2.3 g/dL — ABNORMAL LOW (ref 3.5–5.0)
Anion gap: 6 (ref 5–15)
BUN: 7 mg/dL (ref 6–20)
CALCIUM: 7.3 mg/dL — AB (ref 8.9–10.3)
CO2: 29 mmol/L (ref 22–32)
CREATININE: 0.57 mg/dL (ref 0.44–1.00)
Chloride: 106 mmol/L (ref 101–111)
GFR calc non Af Amer: >60 mL/min (ref >60)
GLUCOSE: 90 mg/dL (ref 65–99)
Potassium: 3 mmol/L — ABNORMAL LOW (ref 3.5–5.1)
SODIUM: 141 mmol/L (ref 135–145)
Total Bilirubin: 2 mg/dL — ABNORMAL HIGH (ref 0.3–1.2)
Total Protein: 6 g/dL — ABNORMAL LOW (ref 6.5–8.1)

## 2015-03-01 LAB — CBC
HEMATOCRIT: 31 % — AB (ref 36.0–46.0)
HEMOGLOBIN: 10.3 g/dL — AB (ref 12.0–15.0)
MCH: 30.9 pg (ref 26.0–34.0)
MCHC: 33.2 g/dL (ref 30.0–36.0)
MCV: 93.1 fL (ref 78.0–100.0)
PLATELETS: 57 10*3/uL — AB (ref 150–400)
RBC: 3.33 MIL/uL — AB (ref 3.87–5.11)
RDW: 14.9 % (ref 11.5–15.5)
WBC: 4.2 10*3/uL (ref 4.0–10.5)

## 2015-03-01 LAB — MRSA PCR SCREENING: MRSA by PCR: POSITIVE — AB

## 2015-03-01 MED ORDER — CHLORHEXIDINE GLUCONATE CLOTH 2 % EX PADS: 6.0000 | MEDICATED_PAD | Freq: Every day | CUTANEOUS | Status: DC

## 2015-03-01 MED ORDER — MUPIROCIN 2 % EX OINT
1.0000 "application " | TOPICAL_OINTMENT | Freq: Two times a day (BID) | CUTANEOUS | Status: DC
  Administered 2015-03-01: 1 via NASAL
  Filled 2015-03-01: qty 22

## 2015-03-01 MED ORDER — CHLORHEXIDINE GLUCONATE CLOTH 2 % EX PADS
6.0000 | MEDICATED_PAD | Freq: Every day | CUTANEOUS | Status: DC
  Administered 2015-03-01: 6 via TOPICAL

## 2015-03-01 MED ORDER — CETYLPYRIDINIUM CHLORIDE 0.05 % MT LIQD
7.0000 mL | Freq: Two times a day (BID) | OROMUCOSAL | Status: DC
  Administered 2015-03-01: 7 mL via OROMUCOSAL

## 2015-03-01 NOTE — Progress Notes (Signed)
Removed pt IV, pt tolerated well.  Reviewed discharge instructions with pt, answered all questions at this time.

## 2015-03-01 NOTE — Care Management Note (Signed)
Case Management Note  Patient Details  Name: Denell Cothern MRN: [id number] Date of Birth: 19-Aug-1955  Expected Discharge Date:                  Expected Discharge Plan:  Home/Self Care  In-House Referral:  NA  Discharge planning Services  CM Consult  Post Acute Care Choice:  NA Choice offered to:  NA  DME Arranged:    DME Agency:     HH Arranged:    [place] Agency:     Status of Service:  Completed, signed off  Medicare Important Message Given:    Date Medicare IM Given:    Medicare IM give by:    Date Additional Medicare IM Given:    Additional Medicare Important Message give by:     If discussed at [place] of Stay Meetings, dates discussed:    Additional Comments: Pt is from home, lives with her daughter in law and independent with ADL's. Pt has PCS aid. Patient has a cane and neb machine for PRN use at home. Pt has no DME or HH needs at this time. Pt plans to discharge home with self care. No CM needs.  Sherald Barge, RN 03/01/2015, 11:17 AM

## 2015-03-01 NOTE — Discharge Summary (Signed)
Physician Discharge Summary  Kristen Marsh HQI:[id number] DOB: [birth year] DOA: 02/28/2015  PCP: Sharion Balloon, FNP  Admit date: 02/28/2015 Discharge date: 03/01/2015  Time spent: 35 minutes  Recommendations for Outpatient Follow-up:  -Will be discharged home today. -Advised to continue use of Lasix and to schedule follow-up with Dr. Laural Golden in 2 weeks.   Discharge Diagnoses:  Active Problems:   Cirrhosis of liver   COPD bronchitis   Alcoholism /alcohol abuse   Ascites due to alcoholic cirrhosis   Ascites   Discharge Condition: Stable and improved  Filed Weights   02/28/15 1110 02/28/15 2131 03/01/15 0603  Weight: 64.365 kg (141 lb 14.4 oz) 64.32 kg (141 lb 12.8 oz) 62.778 kg (138 lb 6.4 oz)    History of present illness:  Today presents to the hospital, with chief complaint of abdominal distention which started for past 3 to 4 days. Patient has a history of alcohol-induced cirrhosis, with hepatitis C, esophageal paralysis and prior history of abdominal ascites. Never had paracentesis before. Patient says that she is having low back pain and also difficulty breathing, dyspnea on exertion. She denies any fever, no chest pain. No nausea vomiting or diarrhea recently. Patient has seen Dr. Laural Golden in the past. She denies abdominal pain. No fever. In the ED patient was found to have normal white count afebrile.   Hospital Course:   Abdominal distention/hepatitis C/alcoholic cirrhosis -No abdominal pain upon questioning of the patient. -She has had 2 ultrasounds this admission that did not show sufficient fluid for paracentesis. -She is advised to continue her diuretic regimen and follow-up with Dr. Laural Golden in 2 weeks or as scheduled.  Rest of chronic medical conditions are stable  Procedures:  None   Consultations:  None  Discharge Instructions  Discharge Instructions    Diet - low sodium heart healthy    Complete by:  As directed      Increase activity slowly     Complete by:  As directed             Medication List    STOP taking these medications        ibuprofen 800 MG tablet  Commonly known as:  ADVIL,MOTRIN     OxyCODONE HCl (Abuse Deter) 5 MG Taba  Commonly known as:  OXAYDO      TAKE these medications        albuterol (2.5 MG/3ML) 0.083% nebulizer solution  Commonly known as:  PROVENTIL  Take 3 mLs (2.5 mg total) by nebulization every 6 (six) hours as needed for wheezing or shortness of breath.     albuterol 108 (90 BASE) MCG/ACT inhaler  Commonly known as:  PROVENTIL HFA  INHALE 2 PUFFS INTO THE LUNGS EVERY FOUR HOURS AS NEEDED FOR WHEEZING.     B-Complex Tabs  Take 1 tablet by mouth daily.     calcium carbonate 1250 (500 CA) MG tablet  Commonly known as:  OS-CAL - dosed in mg of elemental calcium  Take 1 tablet by mouth daily with breakfast.     dicyclomine 20 MG tablet  Commonly known as:  BENTYL  Take 1 tablet (20 mg total) by mouth every 6 (six) hours.     furosemide 40 MG tablet  Commonly known as:  LASIX  TAKE 1 TABLET BY MOUTH ONCE DAILY.     lactulose 10 GM/15ML solution  Commonly known as:  CHRONULAC  TAKE 15MLs BY MOUTH TWICE DAILY.     multivitamin with minerals Tabs tablet  Take 1 tablet by mouth daily.     omeprazole 20 MG capsule  Commonly known as:  PRILOSEC  Take 20 mg by mouth 2 (two) times daily.     ZYRTEC ALLERGY PO  Take 1 tablet by mouth daily.       Allergies  Allergen Reactions  . Codeine Itching  . Hydrocodone-Acetaminophen Itching  . Meloxicam Swelling    Face swelling  . Montelukast Cough       Follow-up Information    Follow up with REHMAN,NAJEEB U, MD In 2 weeks.   Specialty:  Gastroenterology   Contact information:   [place] [address], [address] [place] [address] [phone]        The results of significant diagnostics from this hospitalization (including imaging, microbiology, ancillary and laboratory) are listed below for reference.    Significant  Diagnostic Studies: US Abdomen Complete  02/28/2015   CLINICAL DATA:  Abdominal distention for 3-4 days, history of hepatitis-C and cirrhosis  EXAM: ULTRASOUND ABDOMEN COMPLETE  COMPARISON:  CT abdomen and pelvis of 06/13/2014  FINDINGS: Gallbladder: The gallbladder is visualized and no gallstones are seen. However there is a 4 mm echogenic focus adherent to the wall most consistent with a small polyp. The gallbladder wall is thickened measuring 9 mm, most likely due to hypoproteinemia in this cirrhotic patient. There is no pain over the gallbladder with compression.  Common bile duct: Diameter: The common bile duct is normal measuring 5.8 mm.  Liver: The liver is echogenic and nodular consistent with changes of cirrhosis. Recannulized umbilical vein again is noted as described on prior CT. No focal hepatic lesion is seen however.  IVC: No abnormality visualized.  Pancreas: Visualized portion unremarkable.  Spleen: Splenomegaly is noted with the spleen measuring 11.7 cm, and volume of 711 cubic cm.  Right Kidney: Length: 11.1 cm.  No hydronephrosis is seen.  Left Kidney: Length: 11.7 cm.  No hydronephrosis is seen.  Abdominal aorta: The abdominal aorta is normal caliber with the bifurcation obscured by bowel gas.  Other findings: A small amount of ascites is present.  IMPRESSION: 1. Changes of cirrhosis as noted previously. No focal hepatic lesion is seen. 2. Thickened gallbladder wall most consistent with hypoproteinemia. No pain is present over the gallbladder with compression and no gallstones are noted. 3. Splenomegaly. 4. Small amount of ascites.   Electronically Signed   By: Ivar Drape M.D.   On: 02/28/2015 13:50   Ct Abdomen Pelvis W Contrast  02/28/2015   CLINICAL DATA:  Abdominal pain and swelling for 3 days. Hepatitis-C.  EXAM: CT ABDOMEN AND PELVIS WITH CONTRAST  TECHNIQUE: Multidetector CT imaging of the abdomen and pelvis was performed using the standard protocol following bolus administration of  intravenous contrast.  CONTRAST:  35mL OMNIPAQUE IOHEXOL 300 MG/ML SOLN, 165mL OMNIPAQUE IOHEXOL 300 MG/ML SOLN  COMPARISON:  CT 06/13/2014  FINDINGS: Lower chest: Lung bases are clear.  Hepatobiliary: The liver has a shrunken nodular contour. Portal veins are patent. Gallbladder is normal. Evidence of portal hypertension with recanalization of the umbilical vein, paraesophageal varices, venous collaterals in the lower pelvis and ventral peritoneal space.  Pancreas: Pancreas is normal. No ductal dilatation. No pancreatic inflammation.  Spleen: Mild splenomegaly.  The splenic vein is patent.  Adrenals/urinary tract: Adrenal glands and kidneys are normal. The ureters and bladder normal.  Stomach/Bowel: Stomach and small bowel are normal. Diverticula of descending colon without acute inflammation.  Vascular/Lymphatic: Abdominal aorta is normal caliber. There is no retroperitoneal or  periportal lymphadenopathy. No pelvic lymphadenopathy.  Reproductive: Post hysterectomy.  Musculoskeletal: No aggressive osseous lesion  Other: Moderate volume of ascites surrounding the liver spleen and within the pelvis is increased from comparison exam.  IMPRESSION: 1. Moderate volume ascites increased from CT 06/13/2014. 2. Cirrhosis an dportal hypertension with multiple venous collaterals varices 3. No enhancing hepatic lesion identified.   Electronically Signed   By: Suzy Bouchard M.D.   On: 02/28/2015 17:12   US Abdomen Limited  03/01/2015   CLINICAL DATA:  Cirrhosis.  EXAM: LIMITED ABDOMEN ULTRASOUND FOR ASCITES  TECHNIQUE: Limited ultrasound survey for ascites was performed in all four abdominal quadrants.  COMPARISON:  None.  FINDINGS: Small amount of ascites is seen in the right upper quadrant, mainly within Morison's pouch. No ascites visualized in other 3 abdominal quadrants.  IMPRESSION: Small amount of right upper quadrant ascites.   Electronically Signed   By: Earle Gell M.D.   On: 03/01/2015 14:50     Microbiology: Recent Results (from the past 240 hour(s))  MRSA PCR Screening     Status: Abnormal   Collection Time: 02/28/15 10:20 PM  Result Value Ref Range Status   MRSA by PCR POSITIVE (A) NEGATIVE Final    Comment:        The GeneXpert MRSA Assay (FDA approved for NASAL specimens only), is one component of a comprehensive MRSA colonization surveillance program. It is not intended to diagnose MRSA infection nor to guide or monitor treatment for MRSA infections. RESULT CALLED TO, READ BACK BY AND VERIFIED WITH: BULLOCK T AT [id number] ON [id number] BY FORSYTH K      Labs: Basic Metabolic Panel:  Recent Labs Lab 02/28/15 1130 03/01/15 0528  NA 137 141  K 3.2* 3.0*  CL 105 106  CO2 25 29  GLUCOSE 248* 90  BUN 8 7  CREATININE 0.66 0.57  CALCIUM 7.7* 7.3*   Liver Function Tests:  Recent Labs Lab 02/28/15 1130 03/01/15 0528  AST 208* 162*  ALT 93* 77*  ALKPHOS 148* 125  BILITOT 2.3* 2.0*  PROT 6.9 6.0*  ALBUMIN 2.6* 2.3*    Recent Labs Lab 02/28/15 1130  LIPASE 39    Recent Labs Lab 02/28/15 1207  AMMONIA 60*   CBC:  Recent Labs Lab 02/28/15 1130 03/01/15 0528  WBC 4.0 4.2  HGB 11.8* 10.3*  HCT 35.9* 31.0*  MCV 93.5 93.1  PLT 70* 57*   Cardiac Enzymes: No results for input(s): CKTOTAL, CKMB, CKMBINDEX, TROPONINI in the last 168 hours. BNP: BNP (last 3 results) No results for input(s): BNP in the last 8760 hours.  ProBNP (last 3 results) No results for input(s): PROBNP in the last 8760 hours.  CBG: No results for input(s): GLUCAP in the last 168 hours.     SignedLelon Frohlich  [facility] Pager: [phone] 03/01/2015, 3:23 PM

## 2015-03-17 ENCOUNTER — Other Ambulatory Visit: Payer: Self-pay | Admitting: Family

## 2015-03-19 NOTE — Telephone Encounter (Signed)
Last seen 10/11/14  Kristen Marsh

## 2015-03-23 ENCOUNTER — Ambulatory Visit (INDEPENDENT_AMBULATORY_CARE_PROVIDER_SITE_OTHER): Payer: Medicaid Other | Admitting: Internal Medicine

## 2015-04-03 ENCOUNTER — Other Ambulatory Visit: Payer: Self-pay | Admitting: Family

## 2015-04-03 ENCOUNTER — Telehealth: Payer: Self-pay | Admitting: Family

## 2015-04-03 MED ORDER — LACTULOSE 10 GM/15ML PO SOLN: ORAL | Status: DC

## 2015-04-03 NOTE — Telephone Encounter (Signed)
Prescription sent to pharmacy.

## 2015-04-04 ENCOUNTER — Emergency Department (HOSPITAL_COMMUNITY): Payer: Medicaid Other

## 2015-04-04 ENCOUNTER — Encounter (HOSPITAL_COMMUNITY): Payer: Self-pay | Admitting: *Deleted

## 2015-04-04 ENCOUNTER — Emergency Department (HOSPITAL_COMMUNITY)
Admission: EM | Admit: 2015-04-04 | Discharge: 2015-04-04 | Disposition: A | Discharge status: Home / Self Care | Payer: Medicaid Other | Attending: Emergency Medicine | Admitting: Emergency Medicine

## 2015-04-04 ENCOUNTER — Encounter (INDEPENDENT_AMBULATORY_CARE_PROVIDER_SITE_OTHER): Payer: Self-pay | Admitting: *Deleted

## 2015-04-04 DIAGNOSIS — Z720 Tobacco use: Secondary | ICD-10-CM | POA: Diagnosis not present

## 2015-04-04 DIAGNOSIS — M797 Fibromyalgia: Secondary | ICD-10-CM | POA: Diagnosis not present

## 2015-04-04 DIAGNOSIS — Z872 Personal history of diseases of the skin and subcutaneous tissue: Secondary | ICD-10-CM | POA: Diagnosis not present

## 2015-04-04 DIAGNOSIS — Z8614 Personal history of Methicillin resistant Staphylococcus aureus infection: Secondary | ICD-10-CM | POA: Insufficient documentation

## 2015-04-04 DIAGNOSIS — I10 Essential (primary) hypertension: Secondary | ICD-10-CM | POA: Diagnosis not present

## 2015-04-04 DIAGNOSIS — Z87442 Personal history of urinary calculi: Secondary | ICD-10-CM | POA: Insufficient documentation

## 2015-04-04 DIAGNOSIS — Z79899 Other long term (current) drug therapy: Secondary | ICD-10-CM | POA: Diagnosis not present

## 2015-04-04 DIAGNOSIS — Z8701 Personal history of pneumonia (recurrent): Secondary | ICD-10-CM | POA: Diagnosis not present

## 2015-04-04 DIAGNOSIS — Z8709 Personal history of other diseases of the respiratory system: Secondary | ICD-10-CM | POA: Insufficient documentation

## 2015-04-04 DIAGNOSIS — Z8719 Personal history of other diseases of the digestive system: Secondary | ICD-10-CM | POA: Diagnosis not present

## 2015-04-04 DIAGNOSIS — R5383 Other fatigue: Secondary | ICD-10-CM | POA: Insufficient documentation

## 2015-04-04 DIAGNOSIS — R490 Dysphonia: Secondary | ICD-10-CM

## 2015-04-04 DIAGNOSIS — Z8619 Personal history of other infectious and parasitic diseases: Secondary | ICD-10-CM | POA: Insufficient documentation

## 2015-04-04 DIAGNOSIS — Z8659 Personal history of other mental and behavioral disorders: Secondary | ICD-10-CM | POA: Insufficient documentation

## 2015-04-04 DIAGNOSIS — K219 Gastro-esophageal reflux disease without esophagitis: Secondary | ICD-10-CM | POA: Insufficient documentation

## 2015-04-04 DIAGNOSIS — R05 Cough: Secondary | ICD-10-CM | POA: Diagnosis not present

## 2015-04-04 DIAGNOSIS — D141 Benign neoplasm of larynx: Secondary | ICD-10-CM

## 2015-04-04 DIAGNOSIS — R0602 Shortness of breath: Secondary | ICD-10-CM | POA: Diagnosis present

## 2015-04-04 LAB — CBC WITH DIFFERENTIAL/PLATELET
Basophils Absolute: 0 10*3/uL (ref 0.0–0.1)
Basophils Relative: 1 % (ref 0–1)
EOS ABS: 0.1 10*3/uL (ref 0.0–0.7)
Eosinophils Relative: 2 % (ref 0–5)
HCT: 34.6 % — ABNORMAL LOW (ref 36.0–46.0)
HEMOGLOBIN: 11.3 g/dL — AB (ref 12.0–15.0)
LYMPHS PCT: 23 % (ref 12–46)
Lymphs Abs: 0.9 10*3/uL (ref 0.7–4.0)
MCH: 29.4 pg (ref 26.0–34.0)
MCHC: 32.7 g/dL (ref 30.0–36.0)
MCV: 90.1 fL (ref 78.0–100.0)
Monocytes Absolute: 0.5 10*3/uL (ref 0.1–1.0)
Monocytes Relative: 12 % (ref 3–12)
NEUTROS PCT: 63 % (ref 43–77)
Neutro Abs: 2.5 10*3/uL (ref 1.7–7.7)
Platelets: 60 10*3/uL — ABNORMAL LOW (ref 150–400)
RBC: 3.84 MIL/uL — AB (ref 3.87–5.11)
RDW: 15.6 % — ABNORMAL HIGH (ref 11.5–15.5)
WBC: 4 10*3/uL (ref 4.0–10.5)

## 2015-04-04 LAB — BASIC METABOLIC PANEL
Anion gap: 3 — ABNORMAL LOW (ref 5–15)
BUN: 9 mg/dL (ref 6–20)
CHLORIDE: 109 mmol/L (ref 101–111)
CO2: 24 mmol/L (ref 22–32)
Calcium: 7.8 mg/dL — ABNORMAL LOW (ref 8.9–10.3)
Creatinine, Ser: 0.54 mg/dL (ref 0.44–1.00)
GFR calc Af Amer: >60 mL/min (ref >60)
GFR calc non Af Amer: >60 mL/min (ref >60)
Glucose, Bld: 178 mg/dL — ABNORMAL HIGH (ref 65–99)
Potassium: 3.7 mmol/L (ref 3.5–5.1)
SODIUM: 136 mmol/L (ref 135–145)

## 2015-04-04 MED ORDER — IOHEXOL 300 MG/ML  SOLN
75.0000 mL | Freq: Once | INTRAMUSCULAR | Status: AC | PRN
  Administered 2015-04-04: 75 mL via INTRAVENOUS

## 2015-04-04 MED ORDER — DEXAMETHASONE SODIUM PHOSPHATE 10 MG/ML IJ SOLN
10.0000 mg | Freq: Once | INTRAMUSCULAR | Status: AC
  Administered 2015-04-04: 10 mg via INTRAMUSCULAR
  Filled 2015-04-04: qty 1

## 2015-04-04 MED ORDER — MORPHINE SULFATE (PF) 4 MG/ML IV SOLN
4.0000 mg | Freq: Once | INTRAVENOUS | Status: AC
  Administered 2015-04-04: 4 mg via INTRAVENOUS
  Filled 2015-04-04: qty 1

## 2015-04-04 NOTE — ED Provider Notes (Signed)
CSN: [id number]     Arrival date & time 04/04/15  7989 History  This chart was scribed for Veryl Speak, MD by Tula Nakayama, ED Scribe. This patient was seen in room APA10/APA10 and the patient's care was started at 8:57 AM.   Chief Complaint  Patient presents with  . Shortness of Breath   Patient is a 59 y.o. female presenting with shortness of breath. The history is provided by the patient. No language interpreter was used.  Shortness of Breath Severity:  Mild Onset quality:  Gradual Timing:  Constant Progression:  Unchanged Chronicity:  New Relieved by:  Nothing Worsened by:  Nothing tried Ineffective treatments: nebulizer treatment. Associated symptoms: cough   Associated symptoms: no fever     HPI Comments: Kristen Marsh is a 59 y.o. female with a history of emphysema who presents to the Emergency Department complaining of constant, moderate SOB that started a few days ago. Pt states dry cough, hoarse voice and fatigue as associated symptoms. She tried a breathing treatment PTA with no relief. Pt had biopsy of a polyp on her vocal cords 6 days ago at [facility]. She has a follow-up appointment in 3 weeks and has not told them about present symptoms. Pt reports that she recently quit smoking and drinking. She denies fever.   Past Medical History  Diagnosis Date  . Osteoarthritis   . Hep C w/o coma, chronic   . Emphysema   . GERD (gastroesophageal reflux disease)   . IBS (irritable bowel syndrome)   . 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 MRSA carrier   . Fibromyalgia   . Shortness of breath     with exertion  . Hypertension   . Pneumonia     As a child  . Anxiety   . Kidney stones   . Depression   . Cirrhosis of liver   . Varicose vein     Of esophagus and top of stomach per pt   Past Surgical History  Procedure Laterality Date  . Hysteroscopy    . Appendectomy    . Abdominal hysterectomy    . Diagnostic laparoscopy      exploratory lap  . Colonoscopy w/ biopsies and  polypectomy    . Direct laryngoscopy N/A 03/22/2014    Procedure: DIRECT LARYNGOSCOPY;  Surgeon: Ruby Cola, MD;  Location: [facility];  Service: ENT;  Laterality: N/A;  With biopsy  . Esophagogastroduodenoscopy (egd) with propofol N/A 07/26/2014    Procedure: ESOPHAGOGASTRODUODENOSCOPY (EGD) WITH PROPOFOL;  Surgeon: Rogene Houston, MD;  Location: [facility];  Service: Endoscopy;  Laterality: N/A;  . Direct laryngoscopy N/A 01/15/2015    Procedure: DIRECT LARYNGOSCOPY WITH BIOPSY;  Surgeon: Leta Baptist, MD;  Location: [facility];  Service: ENT;  Laterality: N/A;   Family History  Problem Relation Age of Onset  . Cancer Mother     esophageal  . Cancer Brother     Liver cancer and cirrhosis age 71   Social History  Substance Use Topics  . Smoking status: Current Every Day Smoker -- 0.50 packs/day for 40 years    Types: Cigarettes  . Smokeless tobacco: Never Used     Comment: 1/2 pack a day at least 40 yrs  . Alcohol Use: 2.4 oz/week    4 Cans of beer per week     Comment: 1 smirnoff a day.    OB History    No data available     Review of Systems  Constitutional: Positive for fatigue. Negative for fever.  HENT: Positive for voice change.   Respiratory: Positive for cough and shortness of breath.   All other systems reviewed and are negative.   Allergies  Codeine; Hydrocodone-acetaminophen; Meloxicam; and Montelukast  Home Medications   Prior to Admission medications   Medication Sig Start Date End Date Taking? Authorizing Provider  albuterol (PROVENTIL HFA) 108 (90 BASE) MCG/ACT inhaler INHALE 2 PUFFS INTO THE LUNGS EVERY FOUR HOURS AS NEEDED FOR WHEEZING. 01/12/15   Sharion Balloon, FNP  albuterol (PROVENTIL) (2.5 MG/3ML) 0.083% nebulizer solution Take 3 mLs (2.5 mg total) by nebulization every 6 (six) hours as needed for wheezing or shortness of breath. 11/27/14   Mary-Margaret Hassell Done, FNP  B-Complex TABS Take 1 tablet by mouth daily. 07/11/14   Rogene Houston, MD   calcium carbonate (OS-CAL - DOSED IN MG OF ELEMENTAL CALCIUM) 1250 (500 CA) MG tablet Take 1 tablet by mouth daily with breakfast.    Historical Provider, MD  Cetirizine HCl (ZYRTEC ALLERGY PO) Take 1 tablet by mouth daily.     Historical Provider, MD  dicyclomine (BENTYL) 20 MG tablet Take 1 tablet (20 mg total) by mouth every 6 (six) hours. 10/11/14   Sharion Balloon, FNP  furosemide (LASIX) 40 MG tablet TAKE 1 TABLET BY MOUTH ONCE DAILY. 04/03/15   Sharion Balloon, FNP  lactulose (CHRONULAC) 10 GM/15ML solution TAKE 15MLs BY MOUTH TWICE DAILY. 04/03/15   Sharion Balloon, FNP  Multiple Vitamin (MULTIVITAMIN WITH MINERALS) TABS tablet Take 1 tablet by mouth daily.    Historical Provider, MD  omeprazole (PRILOSEC) 20 MG capsule Take 20 mg by mouth 2 (two) times daily.    Historical Provider, MD   BP 164/99 mmHg  Pulse 91  Temp(Src) 98.2 F (36.8 C) (Oral)  Resp 20  Ht 5\' 2"  (1.575 m)  Wt 135 lb (61.236 kg)  BMI 24.69 kg/m2  SpO2 98% Physical Exam  Constitutional: She is oriented to person, place, and time. She appears well-developed and well-nourished. No distress.  HENT:  Head: Normocephalic and atraumatic.  Mouth/Throat: Oropharynx is clear and moist. No oropharyngeal exudate.  Eyes: Conjunctivae and EOM are normal. Pupils are equal, round, and reactive to light.  Neck: Normal range of motion. Neck supple. No tracheal deviation present. No thyromegaly present.  No meningismus.  Cardiovascular: Normal rate, regular rhythm, normal heart sounds and intact distal pulses.   No murmur heard. Pulmonary/Chest: Effort normal and breath sounds normal. No stridor. No respiratory distress. She has no wheezes. She has no rales.  Pt speaks with a hoarse voice, however is in no respiratory distress and there is no stridor.   Abdominal: Soft. There is no tenderness. There is no rebound and no guarding.  Musculoskeletal: Normal range of motion. She exhibits no edema or tenderness.  Lymphadenopathy:     She has no cervical adenopathy.  Neurological: She is alert and oriented to person, place, and time. No cranial nerve deficit. She exhibits normal muscle tone. Coordination normal.  Skin: Skin is warm.  Psychiatric: She has a normal mood and affect. Her behavior is normal.  Nursing note and vitals reviewed.   ED Course  Procedures   DIAGNOSTIC STUDIES: Oxygen Saturation is 98% on RA, normal by my interpretation.    COORDINATION OF CARE: 9:02 AM Discussed treatment plan with pt which includes CT Soft Tissue Neck, chest x-ray and lab work. Pt agreed to plan.    Labs Review Labs Reviewed - No data  to display  Imaging Review No results found.   EKG Interpretation None      MDM   Final diagnoses:  None    Patient is a 59 year old female who presents with hoarse voice and reported difficulty breathing. 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 This was performed at [facility]. On her exam, she is in no respiratory distress. Her voice is hoarse, however I hear no stridor and lungs are clear area her workup today includes laboratory studies and CT scan of the neck. 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 It is uncertain from the CT scan as to whether this is post biopsy change or possibly supraglottic neoplasm.  I discussed these results with Dr. Joya Gaskins, who was the patient's ENT doctor at [facility]. He reviewed the pathology report returned revealed no cancerous cells, just inflammation. He does not feel as though the results of the CT scan warrant admission. His recommendations are for discharge and he will follow her up in the office. His office will call her to move up the time of her already scheduled appointment.  I personally performed the services described in this documentation, which was scribed in my presence. The recorded information has been reviewed and is accurate.      Veryl Speak, MD 04/04/15 [phone]

## 2015-04-04 NOTE — ED Notes (Signed)
No airway swelling noted. Pt is now calm and has no signs of respiratory distress.

## 2015-04-04 NOTE — ED Notes (Signed)
SOB since having biopsy of throat on Thursday.

## 2015-04-04 NOTE — Discharge Instructions (Signed)
Dr. Bettina Gavia office will call you in the near future to arrange an expedited follow-up appointment.  Go to [facility] if you experience further problems or concerns.   Hoarseness Hoarseness is produced from a variety of causes. It is important to find the cause so it can be treated. In the absence of a cold or upper respiratory illness, any hoarseness lasting more than 2 weeks should be looked at by a specialist. This is especially important if you have a history of smoking or alcohol use. It is also important to keep in mind that as you grow older, your voice will naturally get weaker, making it easier for you to become hoarse from straining your vocal cords.  CAUSES  Any illness that affects your vocal cords can result in a hoarse voice. Examples of conditions that can affect the vocal cords are listed as follows:   Allergies.  Colds.  Sinusitis.  Gastroesophageal reflux disease.  Croup.  Injury.  Nodules.  Exposure to smoke or toxic fumes or gases.  Congenital and genetic defects.  Paralysis of the vocal cords.  Infections.  Advanced age. DIAGNOSIS  In order to diagnose the cause of your hoarseness, your caregiver will examine your throat using an instrument that uses a tube with a small lighted camera (laryngoscope). It allows your caregiver to look into the mouth and down the throat. TREATMENT  For most cases, treatment will focus on the specific cause of the hoarseness. Depending on the cause, hoarseness can be a temporary condition (acute) or it can be long lasting (chronic). Most cases of hoarseness clear up without complications. Your caregiver will explain to you if this is not likely to happen. SEEK IMMEDIATE MEDICAL CARE IF:   You have increasing hoarseness or loss of voice.  You have shortness of breath.  You are coughing up blood.  There is pain in your neck or throat. Document Released: 07/04/2005 Document Revised: 10/13/2011 Document Reviewed:  09/26/2010 [facility] Patient Information 2015 [place], [place]. This information is not intended to replace advice given to you by your health care provider. Make sure you discuss any questions you have with your health care provider.

## 2015-05-06 ENCOUNTER — Encounter (HOSPITAL_COMMUNITY): Payer: Self-pay

## 2015-05-06 ENCOUNTER — Emergency Department (HOSPITAL_COMMUNITY)
Admission: EM | Admit: 2015-05-06 | Discharge: 2015-05-07 | Disposition: A | Discharge status: Other Acute Inpatient Hospital | Payer: Medicaid Other | Attending: Emergency Medicine | Admitting: Emergency Medicine

## 2015-05-06 ENCOUNTER — Emergency Department (HOSPITAL_COMMUNITY): Payer: Medicaid Other

## 2015-05-06 DIAGNOSIS — Z87442 Personal history of urinary calculi: Secondary | ICD-10-CM | POA: Insufficient documentation

## 2015-05-06 DIAGNOSIS — Z872 Personal history of diseases of the skin and subcutaneous tissue: Secondary | ICD-10-CM | POA: Insufficient documentation

## 2015-05-06 DIAGNOSIS — IMO0001 Reserved for inherently not codable concepts without codable children: Secondary | ICD-10-CM | POA: Diagnosis present

## 2015-05-06 DIAGNOSIS — F419 Anxiety disorder, unspecified: Secondary | ICD-10-CM | POA: Diagnosis not present

## 2015-05-06 DIAGNOSIS — F1020 Alcohol dependence, uncomplicated: Secondary | ICD-10-CM | POA: Diagnosis not present

## 2015-05-06 DIAGNOSIS — Z79899 Other long term (current) drug therapy: Secondary | ICD-10-CM | POA: Diagnosis not present

## 2015-05-06 DIAGNOSIS — R451 Restlessness and agitation: Secondary | ICD-10-CM | POA: Diagnosis present

## 2015-05-06 DIAGNOSIS — Z8739 Personal history of other diseases of the musculoskeletal system and connective tissue: Secondary | ICD-10-CM | POA: Insufficient documentation

## 2015-05-06 DIAGNOSIS — Z720 Tobacco use: Secondary | ICD-10-CM | POA: Diagnosis not present

## 2015-05-06 DIAGNOSIS — Z8619 Personal history of other infectious and parasitic diseases: Secondary | ICD-10-CM | POA: Insufficient documentation

## 2015-05-06 DIAGNOSIS — J439 Emphysema, unspecified: Secondary | ICD-10-CM | POA: Insufficient documentation

## 2015-05-06 DIAGNOSIS — F1410 Cocaine abuse, uncomplicated: Secondary | ICD-10-CM | POA: Insufficient documentation

## 2015-05-06 DIAGNOSIS — F1510 Other stimulant abuse, uncomplicated: Secondary | ICD-10-CM | POA: Insufficient documentation

## 2015-05-06 DIAGNOSIS — I10 Essential (primary) hypertension: Secondary | ICD-10-CM | POA: Insufficient documentation

## 2015-05-06 DIAGNOSIS — Z8614 Personal history of Methicillin resistant Staphylococcus aureus infection: Secondary | ICD-10-CM | POA: Insufficient documentation

## 2015-05-06 DIAGNOSIS — Z8701 Personal history of pneumonia (recurrent): Secondary | ICD-10-CM | POA: Diagnosis not present

## 2015-05-06 DIAGNOSIS — F329 Major depressive disorder, single episode, unspecified: Secondary | ICD-10-CM | POA: Diagnosis not present

## 2015-05-06 DIAGNOSIS — K219 Gastro-esophageal reflux disease without esophagitis: Secondary | ICD-10-CM | POA: Diagnosis not present

## 2015-05-06 DIAGNOSIS — R0602 Shortness of breath: Secondary | ICD-10-CM

## 2015-05-06 DIAGNOSIS — F1910 Other psychoactive substance abuse, uncomplicated: Secondary | ICD-10-CM

## 2015-05-06 LAB — COMPREHENSIVE METABOLIC PANEL
ALBUMIN: 3 g/dL — AB (ref 3.5–5.0)
ALT: 39 U/L (ref 14–54)
AST: 93 U/L — ABNORMAL HIGH (ref 15–41)
Alkaline Phosphatase: 134 U/L — ABNORMAL HIGH (ref 38–126)
Anion gap: 8 (ref 5–15)
BILIRUBIN TOTAL: 2.5 mg/dL — AB (ref 0.3–1.2)
BUN: 6 mg/dL (ref 6–20)
CHLORIDE: 106 mmol/L (ref 101–111)
CO2: 24 mmol/L (ref 22–32)
Calcium: 7.6 mg/dL — ABNORMAL LOW (ref 8.9–10.3)
Creatinine, Ser: 0.58 mg/dL (ref 0.44–1.00)
GFR calc Af Amer: >60 mL/min (ref >60)
GFR calc non Af Amer: >60 mL/min (ref >60)
GLUCOSE: 85 mg/dL (ref 65–99)
POTASSIUM: 3 mmol/L — AB (ref 3.5–5.1)
SODIUM: 138 mmol/L (ref 135–145)
Total Protein: 6.8 g/dL (ref 6.5–8.1)

## 2015-05-06 LAB — RAPID URINE DRUG SCREEN, HOSP PERFORMED
AMPHETAMINES: POSITIVE — AB
BENZODIAZEPINES: NOT DETECTED
Barbiturates: NOT DETECTED
Cocaine: POSITIVE — AB
OPIATES: NOT DETECTED
Tetrahydrocannabinol: NOT DETECTED

## 2015-05-06 LAB — CBC WITH DIFFERENTIAL/PLATELET
BASOS PCT: 0 %
Basophils Absolute: 0 10*3/uL (ref 0.0–0.1)
EOS ABS: 0 10*3/uL (ref 0.0–0.7)
Eosinophils Relative: 0 %
HEMATOCRIT: 33.1 % — AB (ref 36.0–46.0)
Hemoglobin: 10.9 g/dL — ABNORMAL LOW (ref 12.0–15.0)
Lymphocytes Relative: 10 %
Lymphs Abs: 0.5 10*3/uL — ABNORMAL LOW (ref 0.7–4.0)
MCH: 29.1 pg (ref 26.0–34.0)
MCHC: 32.9 g/dL (ref 30.0–36.0)
MCV: 88.3 fL (ref 78.0–100.0)
MONO ABS: 0.6 10*3/uL (ref 0.1–1.0)
Monocytes Relative: 13 %
NEUTROS PCT: 77 %
Neutro Abs: 3.8 10*3/uL (ref 1.7–7.7)
Platelets: 63 10*3/uL — ABNORMAL LOW (ref 150–400)
RBC: 3.75 MIL/uL — ABNORMAL LOW (ref 3.87–5.11)
RDW: 16.1 % — AB (ref 11.5–15.5)
WBC: 4.9 10*3/uL (ref 4.0–10.5)

## 2015-05-06 LAB — BRAIN NATRIURETIC PEPTIDE: B NATRIURETIC PEPTIDE 5: 174 pg/mL — AB (ref 0.0–100.0)

## 2015-05-06 LAB — URINALYSIS, ROUTINE W REFLEX MICROSCOPIC
Bilirubin Urine: NEGATIVE
GLUCOSE, UA: NEGATIVE mg/dL
Hgb urine dipstick: NEGATIVE
Ketones, ur: NEGATIVE mg/dL
Leukocytes, UA: NEGATIVE
NITRITE: NEGATIVE
PH: 6 (ref 5.0–8.0)
Protein, ur: NEGATIVE mg/dL
SPECIFIC GRAVITY, URINE: 1.015 (ref 1.005–1.030)
Urobilinogen, UA: 0.2 mg/dL (ref 0.0–1.0)

## 2015-05-06 LAB — ACETAMINOPHEN LEVEL

## 2015-05-06 LAB — ETHANOL: Alcohol, Ethyl (B): 9 mg/dL — ABNORMAL HIGH (ref <5)

## 2015-05-06 LAB — SALICYLATE LEVEL: Salicylate Lvl: <4 mg/dL (ref 2.8–30.0)

## 2015-05-06 LAB — TROPONIN I: Troponin I: <0.03 ng/mL (ref <0.031)

## 2015-05-06 MED ORDER — LORAZEPAM 2 MG/ML IJ SOLN
0.0000 mg | Freq: Four times a day (QID) | INTRAMUSCULAR | Status: DC
Start: 2015-05-06 — End: 2015-05-07

## 2015-05-06 MED ORDER — ZIPRASIDONE MESYLATE 20 MG IM SOLR
20.0000 mg | Freq: Once | INTRAMUSCULAR | Status: DC
Start: 2015-05-06 — End: 2015-05-07

## 2015-05-06 MED ORDER — ZIPRASIDONE MESYLATE 20 MG IM SOLR
20.0000 mg | INTRAMUSCULAR | Status: DC | PRN
Start: 2015-05-06 — End: 2015-05-07

## 2015-05-06 MED ORDER — METHYLPREDNISOLONE SODIUM SUCC 125 MG IJ SOLR
125.0000 mg | Freq: Once | INTRAMUSCULAR | Status: AC
  Administered 2015-05-06: 125 mg via INTRAVENOUS
  Filled 2015-05-06: qty 2

## 2015-05-06 MED ORDER — LORAZEPAM 1 MG PO TABS: 0.0000 mg | ORAL_TABLET | Freq: Four times a day (QID) | ORAL | Status: DC

## 2015-05-06 MED ORDER — FUROSEMIDE 40 MG PO TABS
40.0000 mg | ORAL_TABLET | Freq: Every day | ORAL | Status: DC
  Administered 2015-05-07: 40 mg via ORAL
  Filled 2015-05-06: qty 1

## 2015-05-06 MED ORDER — IPRATROPIUM-ALBUTEROL 0.5-2.5 (3) MG/3ML IN SOLN
RESPIRATORY_TRACT | Status: AC
  Administered 2015-05-06: 3 mL
  Filled 2015-05-06: qty 3

## 2015-05-06 MED ORDER — ZIPRASIDONE MESYLATE 20 MG IM SOLR
INTRAMUSCULAR | Status: AC
  Administered 2015-05-06: 20 mg
  Filled 2015-05-06: qty 20

## 2015-05-06 MED ORDER — LORAZEPAM 1 MG PO TABS: 0.0000 mg | ORAL_TABLET | Freq: Two times a day (BID) | ORAL | Status: DC

## 2015-05-06 MED ORDER — IPRATROPIUM-ALBUTEROL 0.5-2.5 (3) MG/3ML IN SOLN: 3.0000 mL | Freq: Once | RESPIRATORY_TRACT | Status: AC

## 2015-05-06 MED ORDER — THIAMINE HCL 100 MG/ML IJ SOLN
100.0000 mg | Freq: Every day | INTRAMUSCULAR | Status: DC
  Administered 2015-05-06: 100 mg via INTRAVENOUS
  Filled 2015-05-06: qty 2

## 2015-05-06 MED ORDER — ZIPRASIDONE MESYLATE 20 MG IM SOLR: 20.0000 mg | Freq: Once | INTRAMUSCULAR | Status: DC

## 2015-05-06 MED ORDER — LORAZEPAM 2 MG/ML IJ SOLN
0.0000 mg | Freq: Two times a day (BID) | INTRAMUSCULAR | Status: DC
Start: 2015-05-06 — End: 2015-05-07

## 2015-05-06 MED ORDER — DICYCLOMINE HCL 10 MG PO CAPS
20.0000 mg | ORAL_CAPSULE | Freq: Four times a day (QID) | ORAL | Status: DC
  Administered 2015-05-07 (×2): 20 mg via ORAL
  Filled 2015-05-06 (×2): qty 2

## 2015-05-06 MED ORDER — VITAMIN B-1 100 MG PO TABS
100.0000 mg | ORAL_TABLET | Freq: Every day | ORAL | Status: DC
Start: 2015-05-06 — End: 2015-05-07
  Administered 2015-05-07: 100 mg via ORAL
  Filled 2015-05-06: qty 1

## 2015-05-06 MED ORDER — STERILE WATER FOR INJECTION IJ SOLN
INTRAMUSCULAR | Status: AC
  Filled 2015-05-06: qty 10

## 2015-05-06 MED ORDER — ALBUTEROL SULFATE (2.5 MG/3ML) 0.083% IN NEBU
2.5000 mg | INHALATION_SOLUTION | Freq: Four times a day (QID) | RESPIRATORY_TRACT | Status: DC | PRN
  Administered 2015-05-06: 2.5 mg via RESPIRATORY_TRACT
  Filled 2015-05-06: qty 3

## 2015-05-06 MED ORDER — PANTOPRAZOLE SODIUM 40 MG PO TBEC
40.0000 mg | DELAYED_RELEASE_TABLET | Freq: Every day | ORAL | Status: DC
  Administered 2015-05-07: 40 mg via ORAL
  Filled 2015-05-06: qty 1

## 2015-05-06 NOTE — ED Provider Notes (Addendum)
CSN: [id number]     Arrival date & time 05/06/15  1518 History   First MD Initiated Contact with Patient 05/06/15 1520     Chief Complaint  Patient presents with  . Drug Overdose     (Consider location/radiation/quality/duration/timing/severity/associated sxs/prior Treatment) HPI Comments: Patient brought to the emergency department for evaluation by EMS. Patient has reportedly been smoking crack and drinking alcohol for 1 week straight. She reports that she has not slept over that period of time. She is extremely agitated at arrival, cannot sit still, not answering questions appropriately. Level V Caveat due to psychiatric disorder.  Patient is a 59 y.o. female presenting with Overdose.  Drug Overdose    Past Medical History  Diagnosis Date  . Osteoarthritis   . Hep C w/o coma, chronic ([place])   . Emphysema   . GERD (gastroesophageal reflux disease)   . IBS (irritable bowel syndrome)   . 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 MRSA carrier   . Fibromyalgia   . Shortness of breath     with exertion  . Hypertension   . Pneumonia     As a child  . Anxiety   . Kidney stones   . Depression   . Cirrhosis of liver ([place])   . Varicose vein     Of esophagus and top of stomach per pt   Past Surgical History  Procedure Laterality Date  . Hysteroscopy    . Appendectomy    . Abdominal hysterectomy    . Diagnostic laparoscopy      exploratory lap  . Colonoscopy w/ biopsies and polypectomy    . Direct laryngoscopy N/A 03/22/2014    Procedure: DIRECT LARYNGOSCOPY;  Surgeon: Ruby Cola, MD;  Location: [facility];  Service: ENT;  Laterality: N/A;  With biopsy  . Esophagogastroduodenoscopy (egd) with propofol N/A 07/26/2014    Procedure: ESOPHAGOGASTRODUODENOSCOPY (EGD) WITH PROPOFOL;  Surgeon: Rogene Houston, MD;  Location: [facility];  Service: Endoscopy;  Laterality: N/A;  . Direct laryngoscopy N/A 01/15/2015    Procedure: DIRECT LARYNGOSCOPY WITH BIOPSY;  Surgeon: Leta Baptist, MD;  Location: [facility];  Service: ENT;  Laterality: N/A;   Family History  Problem Relation Age of Onset  . Cancer Mother     esophageal  . Cancer Brother     Liver cancer and cirrhosis age 50   Social History  Substance Use Topics  . Smoking status: Current Every Day Smoker -- 0.50 packs/day for 40 years    Types: Cigarettes  . Smokeless tobacco: Never Used     Comment: 1/2 pack a day at least 40 yrs  . Alcohol Use: 2.4 oz/week    4 Cans of beer per week     Comment: 1 smirnoff a day.    OB History    No data available     Review of Systems  Unable to perform ROS: Psychiatric disorder      Allergies  Codeine; Hydrocodone-acetaminophen; Meloxicam; and Montelukast  Home Medications   Prior to Admission medications   Medication Sig Start Date End Date Taking? Authorizing Provider  albuterol (PROVENTIL HFA) 108 (90 BASE) MCG/ACT inhaler INHALE 2 PUFFS INTO THE LUNGS EVERY FOUR HOURS AS NEEDED FOR WHEEZING. 01/12/15   Sharion Balloon, FNP  albuterol (PROVENTIL) (2.5 MG/3ML) 0.083% nebulizer solution Take 3 mLs (2.5 mg total) by nebulization every 6 (six) hours as needed for wheezing or shortness of breath. 11/27/14   Mary-Margaret Hassell Done, FNP  Cetirizine  HCl (ZYRTEC ALLERGY PO) Take 1 tablet by mouth daily.     Historical Provider, MD  dicyclomine (BENTYL) 20 MG tablet Take 1 tablet (20 mg total) by mouth every 6 (six) hours. 10/11/14   Sharion Balloon, FNP  furosemide (LASIX) 40 MG tablet TAKE 1 TABLET BY MOUTH ONCE DAILY. 04/03/15   Sharion Balloon, FNP  lactulose (CHRONULAC) 10 GM/15ML solution TAKE 15MLs BY MOUTH TWICE DAILY. 04/03/15   Sharion Balloon, FNP  omeprazole (PRILOSEC) 20 MG capsule Take 20 mg by mouth 2 (two) times daily.    Historical Provider, MD   BP 100/48 mmHg  Pulse 104  Temp(Src) 98.8 F (37.1 C) (Oral)  Resp 16  Ht 5\' 2"  (1.575 m)  SpO2 98% Physical Exam  Constitutional: She appears well-developed and well-nourished. No distress.  HENT:  Head:  Normocephalic and atraumatic.  Right Ear: Hearing normal.  Left Ear: Hearing normal.  Nose: Nose normal.  Mouth/Throat: Oropharynx is clear and moist and mucous membranes are normal.  Eyes: Conjunctivae and EOM are normal. Pupils are equal, round, and reactive to light.  Neck: Normal range of motion. Neck supple.  Cardiovascular: Regular rhythm, S1 normal and S2 normal.  Exam reveals no gallop and no friction rub.   No murmur heard. Pulmonary/Chest: Effort normal and breath sounds normal. No respiratory distress. She exhibits no tenderness.  Abdominal: Soft. Normal appearance and bowel sounds are normal. There is no hepatosplenomegaly. There is no tenderness. There is no rebound, no guarding, no tenderness at McBurney's point and negative Murphy's sign. No hernia.  Musculoskeletal: Normal range of motion.  Neurological: She is alert. She has normal strength. No cranial nerve deficit or sensory deficit. Coordination normal. GCS eye subscore is 4. GCS verbal subscore is 5. GCS motor subscore is 6.  Skin: Skin is warm, dry and intact. No rash noted. No cyanosis.  Psychiatric: Her mood appears anxious. Her affect is labile. Her speech is rapid and/or pressured. She is agitated and hyperactive.  Nursing note and vitals reviewed.   ED Course  Procedures (including critical care time) Labs Review Labs Reviewed  CBC WITH DIFFERENTIAL/PLATELET - Abnormal; Notable for the following:    RBC 3.75 (*)    Hemoglobin 10.9 (*)    HCT 33.1 (*)    RDW 16.1 (*)    Platelets 63 (*)    Lymphs Abs 0.5 (*)    All other components within normal limits  COMPREHENSIVE METABOLIC PANEL - Abnormal; Notable for the following:    Potassium 3.0 (*)    Calcium 7.6 (*)    Albumin 3.0 (*)    AST 93 (*)    Alkaline Phosphatase 134 (*)    Total Bilirubin 2.5 (*)    All other components within normal limits  ETHANOL - Abnormal; Notable for the following:    Alcohol, Ethyl (B) 9 (*)    All other components within  normal limits  ACETAMINOPHEN LEVEL - Abnormal; Notable for the following:    Acetaminophen (Tylenol), Serum <10 (*)    All other components within normal limits  URINE RAPID DRUG SCREEN, HOSP PERFORMED - Abnormal; Notable for the following:    Cocaine POSITIVE (*)    Amphetamines POSITIVE (*)    All other components within normal limits  BRAIN NATRIURETIC PEPTIDE - Abnormal; Notable for the following:    B Natriuretic Peptide 174.0 (*)    All other components within normal limits  URINALYSIS, ROUTINE W REFLEX MICROSCOPIC (NOT AT [facility])  SALICYLATE LEVEL  TROPONIN I    Imaging Review Dg Chest Port 1 View  05/06/2015   CLINICAL DATA:  Shortness of breath, patient smoking crack cocaine and drinking alcohol for the past week, history of cirrhosis  EXAM: PORTABLE CHEST 1 VIEW  COMPARISON:  04/04/2015  FINDINGS: The heart size and vascular pattern are normal. Lungs are clear. No consolidation or effusion. Bony thorax intact.  IMPRESSION: No active disease.   Electronically Signed   By: Skipper Cliche M.D.   On: 05/06/2015 18:40   I have personally reviewed and evaluated these images and lab results as part of my medical decision-making.   EKG Interpretation   Date/Time:  Sunday May 06 2015 16:02:05 EDT Ventricular Rate:  119 PR Interval:  162 QRS Duration: 88 QT Interval:  338 QTC Calculation: 475 R Axis:   138 Text Interpretation:  Sinus tachycardia No significant change since last  tracing Confirmed by Elida Harbin  MD, [place] [phone]) on 05/06/2015 5:00:40  PM      MDM   Final diagnoses:  Shortness of breath   polysubstance drug abuse  Brought to the ER by ambulance from home. Husband is initiating involuntary commitment. Patient reportedly binging on crack cocaine and alcohol for 1 week straight. Patient extremely agitated at arrival, requiring Geodon for sedation. Will require psychiatric evaluation.  After patient awakened from the initial sedation she started having  increased respiratory rate and complaining of shortness of breath. She does have a history of COPD. Patient was treated with supplemental oxygen, DuoNeb, Solu-Medrol. Cardiac evaluation was performed and is negative. Chest x-ray did not show any acute abnormality. Patient has significantly improved.  Patient medically clear for psychiatric treatment.  Orpah Greek, MD 05/06/15 [place], MD 05/06/15 2236

## 2015-05-06 NOTE — BH Assessment (Addendum)
Tele Assessment Note   Kristen Marsh is an 59 y.o. female presenting to [facility] after being petitioned for involuntary commitment by her son. Pt stated "I have been trying to quit drinking and I have a problem with drugs". Pt reported that she is unable to do it on her own. Pt denies SI, HI and AVH at this time. PT did not report any previous suicide attempts. PT did not report any previous mental health treatment but shared that she has had substance abuse treatment in the past. Pt is unable to recall any additional information regarding her substance abuse treatment. Pt reported that she is unable to sleep.  Pt reported that she has been drinking alcohol and smoking crack.  Pt meets inpatient criteria.   Diagnosis: Substance induced mood disorder, Alcohol Use Disorder, Moderate, Cocaine Use Disorder, Moderate   Past Medical History:  Past Medical History  Diagnosis Date  . Osteoarthritis   . Hep C w/o coma, chronic ([place])   . Emphysema   . GERD (gastroesophageal reflux disease)   . IBS (irritable bowel syndrome)   . 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 MRSA carrier   . Fibromyalgia   . Shortness of breath     with exertion  . Hypertension   . Pneumonia     As a child  . Anxiety   . Kidney stones   . Depression   . Cirrhosis of liver ([place])   . Varicose vein     Of esophagus and top of stomach per pt    Past Surgical History  Procedure Laterality Date  . Hysteroscopy    . Appendectomy    . Abdominal hysterectomy    . Diagnostic laparoscopy      exploratory lap  . Colonoscopy w/ biopsies and polypectomy    . Direct laryngoscopy N/A 03/22/2014    Procedure: DIRECT LARYNGOSCOPY;  Surgeon: Ruby Cola, MD;  Location: [facility];  Service: ENT;  Laterality: N/A;  With biopsy  . Esophagogastroduodenoscopy (egd) with propofol N/A 07/26/2014    Procedure: ESOPHAGOGASTRODUODENOSCOPY (EGD) WITH PROPOFOL;  Surgeon: Rogene Houston, MD;  Location: [facility];  Service: Endoscopy;  Laterality: N/A;  .  Direct laryngoscopy N/A 01/15/2015    Procedure: DIRECT LARYNGOSCOPY WITH BIOPSY;  Surgeon: Leta Baptist, MD;  Location: [facility];  Service: ENT;  Laterality: N/A;    Family History:  Family History  Problem Relation Age of Onset  . Cancer Mother     esophageal  . Cancer Brother     Liver cancer and cirrhosis age 29    Social History:  reports that she has been smoking Cigarettes.  She has a 20 pack-year smoking history. She has never used smokeless tobacco. She reports that she drinks about 2.4 oz of alcohol per week. She reports that she uses illicit drugs (Cocaine and "Crack" cocaine).  Additional Social History:  Alcohol / Drug Use History of alcohol / drug use?: Yes ("UDS positive for cocaine, amphetamines ) Substance #1 Name of Substance 1: Cocaine  1 - Age of First Use: 30's  1 - Amount (size/oz):  (Unable to assess) 1 - Duration: ongoing  1 - Last Use / Amount: 05-05-15 Substance #2 Name of Substance 2: Alcohol  2 - Age of First Use: 17  2 - Frequency: daily  2 - Duration: ongoing   CIWA: CIWA-Ar BP: 116/92 mmHg Pulse Rate: 104 COWS:    PATIENT STRENGTHS: (choose at least two) Average or above average  intelligence Supportive family/friends  Allergies:  Allergies  Allergen Reactions  . Codeine Itching  . Hydrocodone-Acetaminophen Itching  . Meloxicam Swelling    Face swelling  . Montelukast Cough    Home Medications:  (Not in a hospital admission)  OB/GYN Status:  No LMP recorded. Patient has had a hysterectomy.  General Assessment Data Location of Assessment: [facility] TTS Assessment: In system Is this a Tele or Face-to-Face Assessment?: Tele Assessment Is this an Initial Assessment or a Re-assessment for this encounter?: Initial Assessment Marital status: Single Living Arrangements: Other relatives Can pt return to current living arrangement?: Yes Admission Status: Involuntary Is patient capable of signing voluntary admission?:  Yes Referral Source: Self/Family/Friend Insurance type: Medicaid      Crisis Care Plan Living Arrangements: Other relatives Name of Psychiatrist: No provider reported.  Name of Therapist: No provider reported   Education Status Is patient currently in school?: No Current Grade: N/A Highest grade of school patient has completed: N/A Name of school: N/A Contact person: N/A  Risk to self with the past 6 months Suicidal Ideation: No Has patient been a risk to self within the past 6 months prior to admission? : No Suicidal Intent: No Has patient had any suicidal intent within the past 6 months prior to admission? : No Is patient at risk for suicide?: No Suicidal Plan?: No Has patient had any suicidal plan within the past 6 months prior to admission? : No Access to Means: No What has been your use of drugs/alcohol within the last 12 months?: Alcohol and cocaine use reported.  Previous Attempts/Gestures: No How many times?: 0 Other Self Harm Risks: Substance abuse  Triggers for Past Attempts: None known Intentional Self Injurious Behavior: None Family Suicide History: Unknown Recent stressful life event(s): Other (Comment), Financial Problems (Substance abuse ) Persecutory voices/beliefs?: No Depression:  (unable to assess ) Depression Symptoms:  (Unable to assess ) Substance abuse history and/or treatment for substance abuse?: Yes Suicide prevention information given to non-admitted patients: Not applicable  Risk to Others within the past 6 months Homicidal Ideation: No Does patient have any lifetime risk of violence toward others beyond the six months prior to admission? : No Thoughts of Harm to Others: No Current Homicidal Intent: No Current Homicidal Plan: No Access to Homicidal Means: No Identified Victim: N/A History of harm to others?: No Assessment of Violence: None Noted Violent Behavior Description: No violent behaviors noted.  Does patient have access to  weapons?: No Criminal Charges Pending?: No Does patient have a court date: No Is patient on probation?: Unknown  Psychosis Hallucinations: None noted Delusions: None noted  Mental Status Report Appearance/Hygiene: In scrubs Eye Contact: Poor Motor Activity: Restlessness Speech: Soft Level of Consciousness: Quiet/awake Mood: Euthymic Affect: Appropriate to circumstance Anxiety Level: Moderate Thought Processes: Tangential Judgement: Impaired Orientation: Appropriate for developmental age Obsessive Compulsive Thoughts/Behaviors: None  Cognitive Functioning Concentration: Fair Memory: Recent Intact, Remote Intact IQ: Average Insight: Fair Impulse Control: Poor Appetite: Poor Weight Loss:  (Unable to assess ) Weight Gain:  (Unable to assess ) Sleep: Decreased Vegetative Symptoms: Unable to Assess  ADLScreening [facility] Assessment Services) Patient's cognitive ability adequate to safely complete daily activities?: Yes Patient able to express need for assistance with ADLs?: Yes Independently performs ADLs?: Yes (appropriate for developmental age)  Prior Inpatient Therapy Prior Inpatient Therapy: Yes Prior Therapy Dates: Pt unable to recall.  Prior Therapy Facilty/Provider(s): Pt unable to recall Reason for Treatment: Substance abuse   Prior Outpatient Therapy Prior Outpatient Therapy:  No Does patient have an ACCT team?: No Does patient have Intensive In-House Services?  : No Does patient have [facility] services? : No Does patient have P4CC services?: No  ADL Screening (condition at time of admission) Patient's cognitive ability adequate to safely complete daily activities?: Yes Patient able to express need for assistance with ADLs?: Yes Independently performs ADLs?: Yes (appropriate for developmental age)       Abuse/Neglect Assessment (Assessment to be complete while patient is alone) Physical Abuse: Denies Verbal Abuse: Denies Sexual Abuse: Denies Exploitation  of patient/patient's resources: Denies Self-Neglect: Denies     Regulatory affairs officer (For Healthcare) Does patient have an advance directive?: No Would patient like information on creating an advanced directive?: No - patient declined information    Additional Information 1:1 In Past 12 Months?: No CIRT Risk: No Elopement Risk: No Does patient have medical clearance?: Yes     Disposition:  Disposition Initial Assessment Completed for this Encounter: Yes  Siddh Vandeventer S 05/06/2015 10:10 PM

## 2015-05-06 NOTE — ED Notes (Signed)
After ambulating to restroom patient became SOB. Treatment given stats 100%.

## 2015-05-06 NOTE — ED Notes (Addendum)
Patients son Doralee Albino can be reached at:  Cell: [phone] Home: [phone]  While he was here patient gave verbal permission to speak with her son regarding her condition

## 2015-05-06 NOTE — ED Notes (Signed)
Patient smoking crack and drinking alcohol constantly for the last week. CBG 91 in route. History of mental disorder. Patient unable to lay still. Per EMS husband is taking out IVC papers.

## 2015-05-06 NOTE — BH Assessment (Signed)
Assessment completed. Consulted Arlester Marker, NP inpatient treatment when pt is medically cleared. Informed Dr. Betsey Holiday of the recommended. TTS to seek placement.

## 2015-05-06 NOTE — [facility] Counselor (Signed)
Writer was unable to speak w/ EDP Pollina by phone. Writer spoke w/ pt's RN Aldona Bar. As pt is unable to be assessed currently d/t sedation from [facility], Aldona Bar will remove the teleassessment order. She will place order again when pt able to be assessed. TTS will teleassess pt at that time.  Arnold Long, [place] Therapeutic Triage Specialist

## 2015-05-07 ENCOUNTER — Inpatient Hospital Stay (HOSPITAL_COMMUNITY)
Admission: AD | Admit: 2015-05-07 | Discharge: 2015-05-18 | DRG: 885 | Disposition: A | Discharge status: Home / Self Care | Payer: Medicaid Other | Source: Intra-hospital | Attending: Psychiatry | Admitting: Psychiatry

## 2015-05-07 ENCOUNTER — Encounter (HOSPITAL_COMMUNITY): Payer: Self-pay

## 2015-05-07 DIAGNOSIS — F1020 Alcohol dependence, uncomplicated: Secondary | ICD-10-CM | POA: Diagnosis present

## 2015-05-07 DIAGNOSIS — F1424 Cocaine dependence with cocaine-induced mood disorder: Secondary | ICD-10-CM | POA: Insufficient documentation

## 2015-05-07 DIAGNOSIS — J383 Other diseases of vocal cords: Secondary | ICD-10-CM | POA: Diagnosis present

## 2015-05-07 DIAGNOSIS — F329 Major depressive disorder, single episode, unspecified: Secondary | ICD-10-CM | POA: Diagnosis not present

## 2015-05-07 DIAGNOSIS — F17210 Nicotine dependence, cigarettes, uncomplicated: Secondary | ICD-10-CM | POA: Diagnosis present

## 2015-05-07 DIAGNOSIS — I10 Essential (primary) hypertension: Secondary | ICD-10-CM | POA: Diagnosis present

## 2015-05-07 DIAGNOSIS — K219 Gastro-esophageal reflux disease without esophagitis: Secondary | ICD-10-CM | POA: Diagnosis present

## 2015-05-07 DIAGNOSIS — M5030 Other cervical disc degeneration, unspecified cervical region: Secondary | ICD-10-CM | POA: Diagnosis present

## 2015-05-07 DIAGNOSIS — F10239 Alcohol dependence with withdrawal, unspecified: Secondary | ICD-10-CM | POA: Diagnosis present

## 2015-05-07 DIAGNOSIS — F332 Major depressive disorder, recurrent severe without psychotic features: Principal | ICD-10-CM | POA: Diagnosis present

## 2015-05-07 DIAGNOSIS — IMO0001 Reserved for inherently not codable concepts without codable children: Secondary | ICD-10-CM | POA: Diagnosis present

## 2015-05-07 DIAGNOSIS — F1024 Alcohol dependence with alcohol-induced mood disorder: Secondary | ICD-10-CM | POA: Insufficient documentation

## 2015-05-07 DIAGNOSIS — K589 Irritable bowel syndrome without diarrhea: Secondary | ICD-10-CM | POA: Diagnosis present

## 2015-05-07 DIAGNOSIS — Y900 Blood alcohol level of less than 20 mg/100 ml: Secondary | ICD-10-CM | POA: Diagnosis present

## 2015-05-07 DIAGNOSIS — F1910 Other psychoactive substance abuse, uncomplicated: Secondary | ICD-10-CM | POA: Diagnosis not present

## 2015-05-07 DIAGNOSIS — K7460 Unspecified cirrhosis of liver: Secondary | ICD-10-CM | POA: Diagnosis present

## 2015-05-07 DIAGNOSIS — N390 Urinary tract infection, site not specified: Secondary | ICD-10-CM | POA: Diagnosis present

## 2015-05-07 LAB — POC OCCULT BLOOD, ED: Fecal Occult Bld: NEGATIVE

## 2015-05-07 MED ORDER — HYDROXYZINE HCL 25 MG PO TABS
25.0000 mg | ORAL_TABLET | Freq: Four times a day (QID) | ORAL | Status: AC | PRN
  Administered 2015-05-10: 25 mg via ORAL
  Filled 2015-05-07 (×2): qty 1

## 2015-05-07 MED ORDER — ADULT MULTIVITAMIN W/MINERALS CH
1.0000 | ORAL_TABLET | Freq: Every day | ORAL | Status: DC
  Administered 2015-05-07 – 2015-05-18 (×12): 1 via ORAL
  Filled 2015-05-07 (×17): qty 1

## 2015-05-07 MED ORDER — ALUM & MAG HYDROXIDE-SIMETH 200-200-20 MG/5ML PO SUSP
30.0000 mL | ORAL | Status: DC | PRN
  Administered 2015-05-16: 30 mL via ORAL
  Filled 2015-05-07: qty 30

## 2015-05-07 MED ORDER — METHOCARBAMOL 500 MG PO TABS
500.0000 mg | ORAL_TABLET | Freq: Three times a day (TID) | ORAL | Status: DC | PRN
  Administered 2015-05-07 – 2015-05-16 (×14): 500 mg via ORAL
  Filled 2015-05-07 (×14): qty 1

## 2015-05-07 MED ORDER — IBUPROFEN 800 MG PO TABS
800.0000 mg | ORAL_TABLET | Freq: Three times a day (TID) | ORAL | Status: DC | PRN
  Administered 2015-05-07: 800 mg via ORAL
  Filled 2015-05-07: qty 1

## 2015-05-07 MED ORDER — POTASSIUM CHLORIDE CRYS ER 20 MEQ PO TBCR
40.0000 meq | EXTENDED_RELEASE_TABLET | Freq: Once | ORAL | Status: AC
  Administered 2015-05-07: 40 meq via ORAL
  Filled 2015-05-07: qty 2

## 2015-05-07 MED ORDER — LORAZEPAM 1 MG PO TABS: 1.0000 mg | ORAL_TABLET | Freq: Every day | ORAL | Status: DC

## 2015-05-07 MED ORDER — VITAMIN B-1 100 MG PO TABS
100.0000 mg | ORAL_TABLET | Freq: Every day | ORAL | Status: DC
  Administered 2015-05-08 – 2015-05-18 (×11): 100 mg via ORAL
  Filled 2015-05-07 (×13): qty 1

## 2015-05-07 MED ORDER — LORAZEPAM 1 MG PO TABS
1.0000 mg | ORAL_TABLET | Freq: Three times a day (TID) | ORAL | Status: DC
  Administered 2015-05-09: 1 mg via ORAL

## 2015-05-07 MED ORDER — ALUM & MAG HYDROXIDE-SIMETH 200-200-20 MG/5ML PO SUSP
30.0000 mL | Freq: Three times a day (TID) | ORAL | Status: DC
  Administered 2015-05-07: 30 mL via ORAL
  Filled 2015-05-07: qty 30

## 2015-05-07 MED ORDER — IPRATROPIUM-ALBUTEROL 0.5-2.5 (3) MG/3ML IN SOLN
3.0000 mL | Freq: Four times a day (QID) | RESPIRATORY_TRACT | Status: DC | PRN
  Administered 2015-05-08 – 2015-05-17 (×6): 3 mL via RESPIRATORY_TRACT
  Filled 2015-05-07 (×7): qty 3

## 2015-05-07 MED ORDER — LORAZEPAM 1 MG PO TABS
1.0000 mg | ORAL_TABLET | Freq: Four times a day (QID) | ORAL | Status: AC | PRN
  Administered 2015-05-11: 1 mg via ORAL
  Filled 2015-05-07 (×2): qty 1

## 2015-05-07 MED ORDER — ONDANSETRON 4 MG PO TBDP: 4.0000 mg | ORAL_TABLET | Freq: Four times a day (QID) | ORAL | Status: AC | PRN

## 2015-05-07 MED ORDER — LOPERAMIDE HCL 2 MG PO CAPS
2.0000 mg | ORAL_CAPSULE | ORAL | Status: AC | PRN
  Administered 2015-05-08: 4 mg via ORAL
  Filled 2015-05-07: qty 1
  Filled 2015-05-07: qty 2

## 2015-05-07 MED ORDER — TRAZODONE HCL 50 MG PO TABS
50.0000 mg | ORAL_TABLET | Freq: Every evening | ORAL | Status: DC | PRN
  Administered 2015-05-07 – 2015-05-14 (×7): 50 mg via ORAL
  Filled 2015-05-07 (×7): qty 1

## 2015-05-07 MED ORDER — MAGNESIUM HYDROXIDE 400 MG/5ML PO SUSP: 30.0000 mL | Freq: Every day | ORAL | Status: DC | PRN

## 2015-05-07 MED ORDER — LORAZEPAM 1 MG PO TABS
1.0000 mg | ORAL_TABLET | Freq: Four times a day (QID) | ORAL | Status: AC
  Administered 2015-05-07 – 2015-05-08 (×4): 1 mg via ORAL
  Filled 2015-05-07 (×5): qty 1

## 2015-05-07 MED ORDER — ALBUTEROL SULFATE HFA 108 (90 BASE) MCG/ACT IN AERS
2.0000 | INHALATION_SPRAY | Freq: Four times a day (QID) | RESPIRATORY_TRACT | Status: DC | PRN
  Administered 2015-05-07 – 2015-05-17 (×10): 2 via RESPIRATORY_TRACT
  Filled 2015-05-07 (×3): qty 6.7

## 2015-05-07 MED ORDER — LORAZEPAM 1 MG PO TABS: 1.0000 mg | ORAL_TABLET | Freq: Two times a day (BID) | ORAL | Status: DC

## 2015-05-07 NOTE — Progress Notes (Signed)
Spoke with Pt's son Corene Cornea [phone]), who states pt "has been making statements like 'I'm ready to die, can't go on living like this, will do what I have to do'" for approximately 1 week. Son states "I thought she was blowing smoke, but then last night her friends called me and said they found her having trouble breathing. We don't know for sure what happened, I wonder if she tried to OD on crack." Son states that pt voiced SI once about 15 years ago, states "she said she was going to get a gun and kill herself when she and my dad were going through a divorce, but her friend talked her out of it." Son states pt has not had outpatient or inpatient mental health treatment in the past "but she's been to rehab a couple times in her life. In 2014 she had quit everything, and I'm not sure when she started back up with the crack and drinking." Reports "I would be concerned about her safety if she goes back home right now because she's been making these statements for a week and we can't be sure what happened yesterday. I feel like once she's safe we could help her get into a rehab program if she'd be willing to go."  Informed that pt is to be re-evaluated via telepsychiatry today. Son notes he can also be reached at [phone].  Sharren Bridge, MSW, LCSW Clinical Social Work, Disposition  05/07/2015 [phone]

## 2015-05-07 NOTE — ED Notes (Signed)
Notified Dr. Eulis Foster that pt c/o epigastric pain and blood in her stool.  Pt reports "nobody checked."  Pt ambulated to bathroom, requesting pad, pad given.

## 2015-05-07 NOTE — ED Notes (Signed)
megan from [facility] called to ask if pt would be willing to go voluntary to [facility] obs.  Pt agreed to this setting and megan notified.

## 2015-05-07 NOTE — ED Notes (Signed)
Patient with uncorrected hypokalemia. MD notified and verbal order for 40 mEq potassium order obtained and given. VSS. Ready for transfer. Patient left department at this time with [facility] for transfer to [facility].

## 2015-05-07 NOTE — ED Notes (Signed)
Yellow valuables sheet found. Patient had cell phone, silver colored chain, two silver rings. Son called and informed security had these belongings and that he could pick them up at his convenience. Son gave understanding that he is to come to the ED and show ID and he can pick up her valuables.

## 2015-05-07 NOTE — ED Notes (Signed)
Pt accepted to [facility] observation bed 6 by Dr. Dwyane Dee. Report [phone].

## 2015-05-07 NOTE — ED Notes (Signed)
Patient given oral care packet for c/o dry mouth. Instructed on use.

## 2015-05-07 NOTE — ED Notes (Signed)
Water provided at patient request

## 2015-05-07 NOTE — Progress Notes (Signed)
Pt re-evaluated by telepsychiatry today. Per L. Rosana Hoes, NP, and Dr. Dwyane Dee, pt accepted to [facility] obs unit, bed 6 per [facility], report number for RN is [phone].  Pt willing to sign in voluntarily, and due to denying current SI/HI/psychosis, rescinsion of IVC appropriate per Dr. Dwyane Dee. CSW will fax Obs consent for pt to sign prior to transfer. Spoke with [facility] re: pt's placement in Obs unit.  Sharren Bridge, MSW, LCSW Clinical Social Work, Disposition  05/07/2015 [phone]

## 2015-05-07 NOTE — BH Assessment (Signed)
Patient was seen on admission this date by Eyva Califano LCAS who observed patient to be very anxious and was having problems breathing. Patient was difficult to understand and spoke in a low voice. Patient has a intensive SA history and reports continuous use of alcohol and cocaine being positive for both on admission. Patient admits to being sad but states she is not suicidal. Patient does admit she is interested in getting help for her SA issues. Patient needs to be assessed in the a.m. to determine if discharged, level of care desired.

## 2015-05-07 NOTE — ED Notes (Signed)
Attempted to call report. Busy signal obtained.

## 2015-05-07 NOTE — Consult Note (Signed)
Telepsych Consultation   Reason for Consult: Discharge Disposition  Referring Physician:  [facility] EDP Patient Identification: Kristen Marsh MRN:  [id number] Principal Diagnosis: Alcoholism /alcohol abuse [facility]) Diagnosis:   Patient Active Problem List   Diagnosis Date Noted  . Ascites due to alcoholic cirrhosis ([place]) [G50.03] 02/28/2015  . Ascites [R18.8] 02/28/2015  . GAD (generalized anxiety disorder) [F41.1] 10/11/2014  . IBS (irritable bowel syndrome) [K58.9] 10/11/2014  . COPD bronchitis [J44.9] 10/15/2013  . Alcoholism /alcohol abuse ([place]) [F10.20] 10/15/2013  . Cirrhosis of liver ([place]) [K74.60] 08/18/2013  . Degenerative cervical disc [M50.30] 08/18/2013  . Fibromyalgia [M79.7] 08/18/2013  . Muscle cramps [R25.2] 08/18/2013  . GERD (gastroesophageal reflux disease) [K21.9] 08/18/2013    Total Time spent with patient: 30 minutes  Subjective:   Kristen Marsh is a 59 y.o. female patient admitted with substance abuse and erratic behaviors, which prompted her son to have her placed under IVC.   HPI:   Kristen Marsh is a 59 year old female who was brought to the ER by EMS. Patient was reported to have been smoking crack and drinking alcohol for one week. She was noted to be very agitated on admission. Her son who was contacted for collateral expressed concern that the patient has been making suicidal comments for one week and that there friends also became concerned when they found her having trouble breathing. There was suspicion per son that she may have tried to overdose on crack. Patient was evaluated today stating "I just don't like where I live. I am around people who use drugs. No I am not suicidal. I do need help with my substance abuse problem. I have family to live for. My son does not know anything because he does not come around enough." The patient appears depressed and does admit to feeling "sad" but denies any recent attempts to harm self. He urine drug screen is positive for  amphetamines and cocaine. Her alcohol level was nine on admission. Patient reports borrowing Adderall from a friend. Kristen Marsh is very difficult to understand during assessment because of extreme hoarseness caused by a past vocal cord surgery. She appears anxious at times and did become tearful when discussing her unstable living situation and substance abuse.   HPI Elements:   Location:  polysubstance abuse. Quality:  potential danger to self . Severity:  Severe. Timing:  Over this past week. Duration:  Chronic. Context:  Patient using unknown amounts of crack and alcohol.  Past Medical History:  Past Medical History  Diagnosis Date  . Osteoarthritis   . Hep C w/o coma, chronic ([place])   . Emphysema   . GERD (gastroesophageal reflux disease)   . IBS (irritable bowel syndrome)   . 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 MRSA carrier   . Fibromyalgia   . Shortness of breath     with exertion  . Hypertension   . Pneumonia     As a child  . Anxiety   . Kidney stones   . Depression   . Cirrhosis of liver ([place])   . Varicose vein     Of esophagus and top of stomach per pt    Past Surgical History  Procedure Laterality Date  . Hysteroscopy    . Appendectomy    . Abdominal hysterectomy    . Diagnostic laparoscopy      exploratory lap  . Colonoscopy w/ biopsies and polypectomy    . Direct laryngoscopy N/A 03/22/2014    Procedure: DIRECT  LARYNGOSCOPY;  Surgeon: Ruby Cola, MD;  Location: [place];  Service: ENT;  Laterality: N/A;  With biopsy  . Esophagogastroduodenoscopy (egd) with propofol N/A 07/26/2014    Procedure: ESOPHAGOGASTRODUODENOSCOPY (EGD) WITH PROPOFOL;  Surgeon: Rogene Houston, MD;  Location: [facility];  Service: Endoscopy;  Laterality: N/A;  . Direct laryngoscopy N/A 01/15/2015    Procedure: DIRECT LARYNGOSCOPY WITH BIOPSY;  Surgeon: Leta Baptist, MD;  Location: [place];  Service: ENT;  Laterality: N/A;   Family History:  Family History  Problem Relation Age of  Onset  . Cancer Mother     esophageal  . Cancer Brother     Liver cancer and cirrhosis age 88   Social History:  History  Alcohol Use  . 2.4 oz/week  . 4 Cans of beer per week    Comment: 1 smirnoff a day.      History  Drug Use  . Yes  . Special: Cocaine, "Crack" cocaine    Comment: Haven't used cocaine in the last 3 years    Social History   Social History  . Marital Status: Divorced    Spouse Name: N/A  . Number of Children: N/A  . Years of Education: N/A   Social History Main Topics  . Smoking status: Current Every Day Smoker -- 0.50 packs/day for 40 years    Types: Cigarettes  . Smokeless tobacco: Never Used     Comment: 1/2 pack a day at least 40 yrs  . Alcohol Use: 2.4 oz/week    4 Cans of beer per week     Comment: 1 smirnoff a day.   . Drug Use: Yes    Special: Cocaine, "Crack" cocaine     Comment: Haven't used cocaine in the last 3 years  . Sexual Activity: No   Other Topics Concern  . None   Social History Narrative   Additional Social History:    History of alcohol / drug use?: Yes ("UDS positive for cocaine, amphetamines ) Name of Substance 1: Cocaine  1 - Age of First Use: 30's  1 - Amount (size/oz):  (Unable to assess) 1 - Duration: ongoing  1 - Last Use / Amount: 05-05-15 Name of Substance 2: Alcohol  2 - Age of First Use: 17  2 - Frequency: daily  2 - Duration: ongoing                  Allergies:   Allergies  Allergen Reactions  . Codeine Itching  . Hydrocodone-Acetaminophen Itching  . Meloxicam Swelling    Face swelling  . Montelukast Cough    Labs:  Results for orders placed or performed during the hospital encounter of 05/06/15 (from the past 48 hour(s))  CBC with Differential/Platelet     Status: Abnormal   Collection Time: 05/06/15  3:52 PM  Result Value Ref Range   WBC 4.9 4.0 - 10.5 K/uL   RBC 3.75 (L) 3.87 - 5.11 MIL/uL   Hemoglobin 10.9 (L) 12.0 - 15.0 g/dL   HCT 33.1 (L) 36.0 - 46.0 %   MCV 88.3 78.0 - 100.0  fL   MCH 29.1 26.0 - 34.0 pg   MCHC 32.9 30.0 - 36.0 g/dL   RDW 16.1 (H) 11.5 - 15.5 %   Platelets 63 (L) 150 - 400 K/uL    Comment: SPECIMEN CHECKED FOR CLOTS PLATELET COUNT CONFIRMED BY SMEAR    Neutrophils Relative % 77 %   Neutro Abs 3.8 1.7 - 7.7 K/uL  Lymphocytes Relative 10 %   Lymphs Abs 0.5 (L) 0.7 - 4.0 K/uL   Monocytes Relative 13 %   Monocytes Absolute 0.6 0.1 - 1.0 K/uL   Eosinophils Relative 0 %   Eosinophils Absolute 0.0 0.0 - 0.7 K/uL   Basophils Relative 0 %   Basophils Absolute 0.0 0.0 - 0.1 K/uL  Comprehensive metabolic panel     Status: Abnormal   Collection Time: 05/06/15  3:52 PM  Result Value Ref Range   Sodium 138 135 - 145 mmol/L   Potassium 3.0 (L) 3.5 - 5.1 mmol/L   Chloride 106 101 - 111 mmol/L   CO2 24 22 - 32 mmol/L   Glucose, Bld 85 65 - 99 mg/dL   BUN 6 6 - 20 mg/dL   Creatinine, Ser 0.58 0.44 - 1.00 mg/dL   Calcium 7.6 (L) 8.9 - 10.3 mg/dL   Total Protein 6.8 6.5 - 8.1 g/dL   Albumin 3.0 (L) 3.5 - 5.0 g/dL   AST 93 (H) 15 - 41 U/L   ALT 39 14 - 54 U/L   Alkaline Phosphatase 134 (H) 38 - 126 U/L   Total Bilirubin 2.5 (H) 0.3 - 1.2 mg/dL   GFR calc non Af Amer >60 >60 mL/min   GFR calc Af Amer >60 >60 mL/min    Comment: (NOTE) The eGFR has been calculated using the CKD EPI equation. This calculation has not been validated in all clinical situations. eGFR's persistently <60 mL/min signify possible Chronic Kidney Disease.    Anion gap 8 5 - 15  Ethanol     Status: Abnormal   Collection Time: 05/06/15  3:52 PM  Result Value Ref Range   Alcohol, Ethyl (B) 9 (H) <5 mg/dL    Comment:        LOWEST DETECTABLE LIMIT FOR SERUM ALCOHOL IS 5 mg/dL FOR MEDICAL PURPOSES ONLY   Salicylate level     Status: None   Collection Time: 05/06/15  3:52 PM  Result Value Ref Range   Salicylate Lvl <2.3 2.8 - 30.0 mg/dL  Acetaminophen level     Status: Abnormal   Collection Time: 05/06/15  3:52 PM  Result Value Ref Range   Acetaminophen (Tylenol),  Serum <10 (L) 10 - 30 ug/mL    Comment:        THERAPEUTIC CONCENTRATIONS VARY SIGNIFICANTLY. A RANGE OF 10-30 ug/mL MAY BE AN EFFECTIVE CONCENTRATION FOR MANY PATIENTS. HOWEVER, SOME ARE BEST TREATED AT CONCENTRATIONS OUTSIDE THIS RANGE. ACETAMINOPHEN CONCENTRATIONS >150 ug/mL AT 4 HOURS AFTER INGESTION AND >50 ug/mL AT 12 HOURS AFTER INGESTION ARE OFTEN ASSOCIATED WITH TOXIC REACTIONS.   Troponin I     Status: None   Collection Time: 05/06/15  4:07 PM  Result Value Ref Range   Troponin I <0.03 <0.031 ng/mL    Comment:        NO INDICATION OF MYOCARDIAL INJURY.   Brain natriuretic peptide     Status: Abnormal   Collection Time: 05/06/15  4:07 PM  Result Value Ref Range   B Natriuretic Peptide 174.0 (H) 0.0 - 100.0 pg/mL  Urinalysis, Routine w reflex microscopic (not at [facility])     Status: None   Collection Time: 05/06/15  4:08 PM  Result Value Ref Range   Color, Urine YELLOW YELLOW   APPearance CLEAR CLEAR   Specific Gravity, Urine 1.015 1.005 - 1.030   pH 6.0 5.0 - 8.0   Glucose, UA NEGATIVE NEGATIVE mg/dL   Hgb urine dipstick NEGATIVE NEGATIVE  Bilirubin Urine NEGATIVE NEGATIVE   Ketones, ur NEGATIVE NEGATIVE mg/dL   Protein, ur NEGATIVE NEGATIVE mg/dL   Urobilinogen, UA 0.2 0.0 - 1.0 mg/dL   Nitrite NEGATIVE NEGATIVE   Leukocytes, UA NEGATIVE NEGATIVE    Comment: MICROSCOPIC NOT DONE ON URINES WITH NEGATIVE PROTEIN, BLOOD, LEUKOCYTES, NITRITE, OR GLUCOSE <1000 mg/dL.  Urine rapid drug screen (hosp performed)     Status: Abnormal   Collection Time: 05/06/15  4:08 PM  Result Value Ref Range   Opiates NONE DETECTED NONE DETECTED   Cocaine POSITIVE (A) NONE DETECTED   Benzodiazepines NONE DETECTED NONE DETECTED   Amphetamines POSITIVE (A) NONE DETECTED   Tetrahydrocannabinol NONE DETECTED NONE DETECTED   Barbiturates NONE DETECTED NONE DETECTED    Comment:        DRUG SCREEN FOR MEDICAL PURPOSES ONLY.  IF CONFIRMATION IS NEEDED FOR ANY PURPOSE, NOTIFY  LAB WITHIN 5 DAYS.        LOWEST DETECTABLE LIMITS FOR URINE DRUG SCREEN Drug Class       Cutoff (ng/mL) Amphetamine      1000 Barbiturate      200 Benzodiazepine   329 Tricyclics       924 Opiates          300 Cocaine          300 THC              50   POC occult blood, ED RN will collect     Status: None   Collection Time: 05/07/15  8:47 AM  Result Value Ref Range   Fecal Occult Bld NEGATIVE NEGATIVE    Vitals: Blood pressure 125/55, pulse 105, temperature 98.8 F (37.1 C), temperature source Oral, resp. rate 19, height '5\' 2"'  (1.575 m), SpO2 96 %.  Risk to Self: Suicidal Ideation: No Suicidal Intent: No Is patient at risk for suicide?: No Suicidal Plan?: No Access to Means: No What has been your use of drugs/alcohol within the last 12 months?: Alcohol and cocaine use reported.  How many times?: 0 Other Self Harm Risks: Substance abuse  Triggers for Past Attempts: None known Intentional Self Injurious Behavior: None Risk to Others: Homicidal Ideation: No Thoughts of Harm to Others: No Current Homicidal Intent: No Current Homicidal Plan: No Access to Homicidal Means: No Identified Victim: N/A History of harm to others?: No Assessment of Violence: None Noted Violent Behavior Description: No violent behaviors noted.  Does patient have access to weapons?: No Criminal Charges Pending?: No Does patient have a court date: No Prior Inpatient Therapy: Prior Inpatient Therapy: Yes Prior Therapy Dates: Pt unable to recall.  Prior Therapy Facilty/Provider(s): Pt unable to recall Reason for Treatment: Substance abuse  Prior Outpatient Therapy: Prior Outpatient Therapy: No Does patient have an ACCT team?: No Does patient have Intensive In-House Services?  : No Does patient have [facility] services? : No Does patient have P4CC services?: No  Current Facility-Administered Medications  Medication Dose Route Frequency Provider Last Rate Last Dose  . albuterol (PROVENTIL) (2.5  MG/3ML) 0.083% nebulizer solution 2.5 mg  2.5 mg Nebulization Q6H PRN Orpah Greek, MD   2.5 mg at 05/06/15 1802  . alum & mag hydroxide-simeth (MAALOX/MYLANTA) 200-200-20 MG/5ML suspension 30 mL  30 mL Oral TID AC & HS Daleen Bo, MD   30 mL at 05/07/15 1255  . dicyclomine (BENTYL) capsule 20 mg  20 mg Oral Q6H Orpah Greek, MD   20 mg at 05/07/15 0906  . furosemide (LASIX) tablet  40 mg  40 mg Oral Daily Orpah Greek, MD   40 mg at 05/07/15 0906  . ibuprofen (ADVIL,MOTRIN) tablet 800 mg  800 mg Oral TID PRN Daleen Bo, MD   800 mg at 05/07/15 1131  . LORazepam (ATIVAN) injection 0-4 mg  0-4 mg Intravenous 4 times per day Orpah Greek, MD   0 mg at 05/06/15 1829  . LORazepam (ATIVAN) injection 0-4 mg  0-4 mg Intravenous Q12H Orpah Greek, MD   0 mg at 05/06/15 1827  . LORazepam (ATIVAN) tablet 0-4 mg  0-4 mg Oral 4 times per day Orpah Greek, MD   0 mg at 05/06/15 1828  . LORazepam (ATIVAN) tablet 0-4 mg  0-4 mg Oral Q12H Orpah Greek, MD   0 mg at 05/06/15 1827  . pantoprazole (PROTONIX) EC tablet 40 mg  40 mg Oral Daily Orpah Greek, MD   40 mg at 05/07/15 0906  . thiamine (VITAMIN B-1) tablet 100 mg  100 mg Oral Daily Orpah Greek, MD   100 mg at 05/07/15 0906  . ziprasidone (GEODON) injection 20 mg  20 mg Intramuscular Once Orpah Greek, MD   20 mg at 05/06/15 1538  . ziprasidone (GEODON) injection 20 mg  20 mg Intramuscular Once Orpah Greek, MD   20 mg at 05/06/15 1539  . ziprasidone (GEODON) injection 20 mg  20 mg Intramuscular Q4H PRN Orpah Greek, MD       Current Outpatient Prescriptions  Medication Sig Dispense Refill  . albuterol (PROVENTIL HFA) 108 (90 BASE) MCG/ACT inhaler INHALE 2 PUFFS INTO THE LUNGS EVERY FOUR HOURS AS NEEDED FOR WHEEZING. 6.7 g 1  . albuterol (PROVENTIL) (2.5 MG/3ML) 0.083% nebulizer solution Take 3 mLs (2.5 mg total) by nebulization every 6 (six)  hours as needed for wheezing or shortness of breath. 75 mL 3  . Cetirizine HCl (ZYRTEC ALLERGY PO) Take 1 tablet by mouth daily.     Marland Kitchen dicyclomine (BENTYL) 20 MG tablet Take 1 tablet (20 mg total) by mouth every 6 (six) hours. 120 tablet 6  . furosemide (LASIX) 40 MG tablet TAKE 1 TABLET BY MOUTH ONCE DAILY. 30 tablet 0  . lactulose (CHRONULAC) 10 GM/15ML solution TAKE 15MLs BY MOUTH TWICE DAILY. 473 mL 1  . omeprazole (PRILOSEC) 20 MG capsule Take 20 mg by mouth 2 (two) times daily.      Musculoskeletal: Unable to assess due to the patient being in bed  Psychiatric Specialty Exam: Physical Exam  Cardiovascular: Normal rate.     Review of Systems  Constitutional: Negative.   HENT: Negative.   Eyes: Negative.   Respiratory: Negative.   Cardiovascular: Negative.   Gastrointestinal: Negative.   Genitourinary: Negative.   Musculoskeletal: Positive for back pain.  Skin: Negative.   Neurological: Negative.   Endo/Heme/Allergies: Negative.   Psychiatric/Behavioral: Positive for depression and substance abuse. The patient is nervous/anxious.     Blood pressure 125/55, pulse 105, temperature 98.8 F (37.1 C), temperature source Oral, resp. rate 19, height '5\' 2"'  (1.575 m), SpO2 96 %.There is no weight on file to calculate BMI.  General Appearance: Disheveled  Eye Sport and exercise psychologist::  Fair  Speech:  normal  Volume:  Difficult to hear due to past vocal cord surgery   Mood:  Dysphoric  Affect:  Tearful  Thought Process:  Coherent  Orientation:  Full (Time, Place, and Person)  Thought Content:  Rumination  Suicidal Thoughts:  No  Homicidal Thoughts:  No  Memory:  Immediate;   Good Recent;   Fair Remote;   Fair  Judgement:  Impaired  Insight:  Lacking  Psychomotor Activity:  Increased and Restlessness  Concentration:  Fair  Recall:  [employer] of Knowledge:Good  Language: Good  Akathisia:  No  Handed:  Right  AIMS (if indicated):     Assets:  Communication Skills Desire for  Improvement Financial Resources/Insurance Leisure Time Resilience Social Support  ADL's:  Intact  Cognition: WNL  Sleep:      Medical Decision Making: Review of Psycho-Social Stressors (1), Review or order clinical lab tests (1) and Review of Medication Regimen & Side Effects (2)   Treatment Plan Summary: Will admit to [place] [facility] to further evaluate acute risk to self and for referral for substance abuse services  Disposition: Admit to  [place] [facility]   Elmarie Shiley, NP-C 05/07/2015 2:43 PM

## 2015-05-07 NOTE — ED Notes (Addendum)
Patient c/o throat tenderness. Patient has been talking non-stop to sitter and RN (when in room) since RN took over at 1100. Patient with past h/o throat surgery that resulted in hoarseness. Patient straining voice to talk. Offered ice chips and advised to rest voice. Patient still talking when RN left room.

## 2015-05-07 NOTE — ED Provider Notes (Signed)
9:19 AM The patient complained to the nurse that she was having rectal bleeding and epigastric abdominal pain. Maalox ordered when necessary discomfort. Stool checked, by nursing and is negative for blood.  At this time. The patient states that she is not suicidal. She feels better after the treatment, which has been given. She states that she just wants detoxification for alcohol and cocaine abuse.  We'll contact TTS for reassessment.  14:15- she has been seen by TTS, and they agreed to admit her for observation, on a voluntary basis. I rescinded the IVC paperwork.  Daleen Bo, MD 05/07/15 1416

## 2015-05-08 DIAGNOSIS — K7469 Other cirrhosis of liver: Secondary | ICD-10-CM | POA: Diagnosis not present

## 2015-05-08 DIAGNOSIS — F1994 Other psychoactive substance use, unspecified with psychoactive substance-induced mood disorder: Secondary | ICD-10-CM | POA: Diagnosis not present

## 2015-05-08 DIAGNOSIS — I10 Essential (primary) hypertension: Secondary | ICD-10-CM | POA: Diagnosis present

## 2015-05-08 DIAGNOSIS — K219 Gastro-esophageal reflux disease without esophagitis: Secondary | ICD-10-CM | POA: Diagnosis not present

## 2015-05-08 DIAGNOSIS — K589 Irritable bowel syndrome without diarrhea: Secondary | ICD-10-CM | POA: Diagnosis not present

## 2015-05-08 DIAGNOSIS — J383 Other diseases of vocal cords: Secondary | ICD-10-CM | POA: Diagnosis present

## 2015-05-08 DIAGNOSIS — F1910 Other psychoactive substance abuse, uncomplicated: Secondary | ICD-10-CM | POA: Diagnosis not present

## 2015-05-08 DIAGNOSIS — F17210 Nicotine dependence, cigarettes, uncomplicated: Secondary | ICD-10-CM | POA: Diagnosis present

## 2015-05-08 DIAGNOSIS — F1020 Alcohol dependence, uncomplicated: Secondary | ICD-10-CM | POA: Diagnosis not present

## 2015-05-08 DIAGNOSIS — Y900 Blood alcohol level of less than 20 mg/100 ml: Secondary | ICD-10-CM | POA: Diagnosis present

## 2015-05-08 DIAGNOSIS — F10239 Alcohol dependence with withdrawal, unspecified: Secondary | ICD-10-CM | POA: Diagnosis present

## 2015-05-08 DIAGNOSIS — F1024 Alcohol dependence with alcohol-induced mood disorder: Secondary | ICD-10-CM | POA: Diagnosis not present

## 2015-05-08 DIAGNOSIS — F332 Major depressive disorder, recurrent severe without psychotic features: Secondary | ICD-10-CM | POA: Diagnosis present

## 2015-05-08 DIAGNOSIS — N390 Urinary tract infection, site not specified: Secondary | ICD-10-CM | POA: Diagnosis present

## 2015-05-08 DIAGNOSIS — F1424 Cocaine dependence with cocaine-induced mood disorder: Secondary | ICD-10-CM | POA: Diagnosis not present

## 2015-05-08 DIAGNOSIS — F1420 Cocaine dependence, uncomplicated: Secondary | ICD-10-CM | POA: Diagnosis not present

## 2015-05-08 DIAGNOSIS — K7460 Unspecified cirrhosis of liver: Secondary | ICD-10-CM | POA: Diagnosis not present

## 2015-05-08 MED ORDER — INFLUENZA VAC SPLIT QUAD 0.5 ML IM SUSY
0.5000 mL | PREFILLED_SYRINGE | INTRAMUSCULAR | Status: AC
  Administered 2015-05-09: 0.5 mL via INTRAMUSCULAR
  Filled 2015-05-08: qty 0.5

## 2015-05-08 MED ORDER — POTASSIUM CHLORIDE CRYS ER 20 MEQ PO TBCR
20.0000 meq | EXTENDED_RELEASE_TABLET | Freq: Two times a day (BID) | ORAL | Status: AC
  Administered 2015-05-08 – 2015-05-09 (×4): 20 meq via ORAL
  Filled 2015-05-08 (×5): qty 1

## 2015-05-08 MED ORDER — PNEUMOCOCCAL VAC POLYVALENT 25 MCG/0.5ML IJ INJ
0.5000 mL | INJECTION | INTRAMUSCULAR | Status: AC
  Administered 2015-05-09: 0.5 mL via INTRAMUSCULAR

## 2015-05-08 NOTE — Progress Notes (Signed)
This writer found patient resting in obs at 1815. Patient disheveled, having difficulty getting pills to mouth. Eating some of dinner tray. Displays confusion. Observed running to bathroom with diarrhea (patient has IBS). Patient with significant dypsnea however O2 sats at 98-100% on RA, even with ambulation. Per obs RN, patient has slept all day due to 5 day crack binge. Also reports binge drinking. Per son, patient has expressed SI x 1 week however denies this. Patient assisted into wheelchair and admission completed in search room, belongings secured. Patient wheeled back to inpatient room where she attempted to get up however lost balance and began to fall toward bed. This Probation officer at patient side and was able to steady her. Consulted with Dr. Sabra Heck and order received for 1:1 obs. Patient made aware and verbalizes understanding. She denies SI/HI. No pain at this time. Sitter at bedside. [name]

## 2015-05-08 NOTE — Progress Notes (Signed)
Patient 1:1 Note D: Writer was in patient room helping patient change clothing after she was unable to make it to the bathroom in time.  Patient has had diarrhea and had a BM in clothing.  Patient appears to overexert with activity and has audible wheezing.  Patient states, "I don't know what I want I am just tired."  Patient appears weak and tired.  Patient VS were taken tonight and documented.  Patient is unsteady on feet and staff has to assist patient while ambulating.  Patient denies SI/HI and denies AVH.   A: Staff to monitor Q 15 mins for safety.  Encouragement and support offered.  Scheduled medications administered per orders.  Imodium administered prn for diarrhea tonight.  Breathing treatment given to patient tonight as well R: Patient remains safe on the unit.  Patient did not attend group tonight.  Patient taking administered medications.

## 2015-05-08 NOTE — BHH Counselor (Signed)
Spoke with patient about being admitted to the unit. Patient agrees and states that she is willing to go to [facility] voluntarily.  Informed Letitia Libra, RN, [facility] and she states that she will provide a bed number once discharges have left on the 300 hall.    [name], LCSW Therapeutic Triage Specialist [place] 05/08/2015 12:28 PM

## 2015-05-08 NOTE — BHH Counselor (Signed)
Patients son, Kristen Marsh, called and requested information for the patient. Counselor informed him that we can neither confirm or deny that a patient is here but he can leave a message if he would like. Patients son left his number as [phone] and the patient called him back. Patient states that her son would like to visit and drop off clothes. Patient was informed that there are no visitation hours on this unit.   [name], LCSW Therapeutic Triage Specialist [place] 05/08/2015 5:02 PM

## 2015-05-08 NOTE — BHH Counselor (Signed)
Patient has been accepted to [facility] per Laney Pastor, RN, [facility], Elmarie Shiley accepting, Dr. Sabra Heck attending.  Patient can go to [facility] around 5:00PM.  [name], LCSW Therapeutic Triage Specialist [place] 05/08/2015 3:50 PM

## 2015-05-08 NOTE — H&P (Signed)
Psychiatric Admission Assessment Adult  Patient Identification: Kristen Marsh MRN:  [id number] Date of Evaluation:  05/08/2015 Chief Complaint:  SUBSTANCE INDUCED MOOD DISORDER ALCOHOL USE DISORDER, MODERATE Principal Diagnosis: <principal problem not specified> Diagnosis:  Substance induced mood disorder Patient Active Problem List   Diagnosis Date Noted  . Polysubstance abuse [F19.10] 05/07/2015  . Ascites due to alcoholic cirrhosis ([place]) [H06.23] 02/28/2015  . Ascites [R18.8] 02/28/2015  . GAD (generalized anxiety disorder) [F41.1] 10/11/2014  . IBS (irritable bowel syndrome) [K58.9] 10/11/2014  . COPD bronchitis [J44.9] 10/15/2013  . Alcoholism /alcohol abuse ([place]) [F10.20] 10/15/2013  . Cirrhosis of liver ([place]) [K74.60] 08/18/2013  . Degenerative cervical disc [M50.30] 08/18/2013  . Fibromyalgia [M79.7] 08/18/2013  . Muscle cramps [R25.2] 08/18/2013  . GERD (gastroesophageal reflux disease) [K21.9] 08/18/2013   History of Present Illness: Patient is a 59 y/o WF , looking older than stated age. Admitted to the [facility] observation unit due to substance induced mood disorder. Patient was reportedly binge drinking and using crack cocaine over the past several days. The patient endorses using 500.00 dollars worth of crack over these past few days in addition too drinking  2 Smirnoff ICE drinks a daily. Patient is endorsing feeling sad, depressed and guilty because of her substance abuse relapse. The patient reportedly had been clean , dry and sober for 3 years. The patient has a hx of passive SI and SA, but is denying any lethality at this present time. The patient is also denying any AVH, paranoia and or delusional thoughts. Other triggers exacerbating her depressed mood include her continued declining health.  Associated Signs/Symptoms: Depression Symptoms:  depressed mood, feelings of worthlessness/guilt, hopelessness, (Hypo) Manic Symptoms: denies Hallucinations, Impulsivity, flight of  ideas Anxiety Symptoms:  Excessive Worry, Psychotic Symptoms:  denies AVH, delusions or paranoia PTSD Symptoms: Negative Total Time spent with patient: 30 minutes  Past Psychiatric History: 1) Substance induced Mood Disorder, 2) Alcoholism, 3) GAD Risk to Self: Is patient at risk for suicide?: No Risk to Others:  no Prior Inpatient Therapy:   yes Prior Outpatient Therapy:  yes  Alcohol Screening: Patient refused Alcohol Screening Tool: Yes Substance Abuse History in the last 12 months:  No. Consequences of Substance Abuse: Medical Consequences:  worseing Liver and pulmonary disease Previous Psychotropic Medications: Yes  Psychological Evaluations: Yes  Past Medical History:  Past Medical History  Diagnosis Date  . Osteoarthritis   . Hep C w/o coma, chronic ([place])   . Emphysema   . GERD (gastroesophageal reflux disease)   . IBS (irritable bowel syndrome)   . 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 MRSA carrier   . Fibromyalgia   . Shortness of breath     with exertion  . Hypertension   . Pneumonia     As a child  . Anxiety   . Kidney stones   . Depression   . Cirrhosis of liver ([place])   . Varicose vein     Of esophagus and top of stomach per pt    Past Surgical History  Procedure Laterality Date  . Hysteroscopy    . Appendectomy    . Abdominal hysterectomy    . Diagnostic laparoscopy      exploratory lap  . Colonoscopy w/ biopsies and polypectomy    . Direct laryngoscopy N/A 03/22/2014    Procedure: DIRECT LARYNGOSCOPY;  Surgeon: Ruby Cola, MD;  Location: [facility];  Service: ENT;  Laterality: N/A;  With biopsy  . Esophagogastroduodenoscopy (egd) with propofol N/A  07/26/2014    Procedure: ESOPHAGOGASTRODUODENOSCOPY (EGD) WITH PROPOFOL;  Surgeon: Rogene Houston, MD;  Location: [facility];  Service: Endoscopy;  Laterality: N/A;  . Direct laryngoscopy N/A 01/15/2015    Procedure: DIRECT LARYNGOSCOPY WITH BIOPSY;  Surgeon: Leta Baptist, MD;  Location: [facility];   Service: ENT;  Laterality: N/A;   Family History:  Family History  Problem Relation Age of Onset  . Cancer Mother     esophageal  . Cancer Brother     Liver cancer and cirrhosis age 102   Family Psychiatric  History: Unremarkable Social History:  History  Alcohol Use  . 2.4 oz/week  . 4 Cans of beer per week    Comment: 1 smirnoff a day.      History  Drug Use  . Yes  . Special: Cocaine, "Crack" cocaine    Comment: Haven't used cocaine in the last 3 years    Social History   Social History  . Marital Status: Divorced    Spouse Name: N/A  . Number of Children: N/A  . Years of Education: N/A   Social History Main Topics  . Smoking status: Current Every Day Smoker -- 0.50 packs/day for 40 years    Types: Cigarettes  . Smokeless tobacco: Never Used     Comment: 1/2 pack a day at least 40 yrs  . Alcohol Use: 2.4 oz/week    4 Cans of beer per week     Comment: 1 smirnoff a day.   . Drug Use: Yes    Special: Cocaine, "Crack" cocaine     Comment: Haven't used cocaine in the last 3 years  . Sexual Activity: No   Other Topics Concern  . None   Social History Narrative   Additional Social History:                        Allergies:   Allergies  Allergen Reactions  . Codeine Itching  . Hydrocodone-Acetaminophen Itching  . Meloxicam Swelling    Face swelling  . Montelukast Cough   Lab Results:  Results for orders placed or performed during the hospital encounter of 05/06/15 (from the past 48 hour(s))  CBC with Differential/Platelet     Status: Abnormal   Collection Time: 05/06/15  3:52 PM  Result Value Ref Range   WBC 4.9 4.0 - 10.5 K/uL   RBC 3.75 (L) 3.87 - 5.11 MIL/uL   Hemoglobin 10.9 (L) 12.0 - 15.0 g/dL   HCT 33.1 (L) 36.0 - 46.0 %   MCV 88.3 78.0 - 100.0 fL   MCH 29.1 26.0 - 34.0 pg   MCHC 32.9 30.0 - 36.0 g/dL   RDW 16.1 (H) 11.5 - 15.5 %   Platelets 63 (L) 150 - 400 K/uL    Comment: SPECIMEN CHECKED FOR CLOTS PLATELET COUNT CONFIRMED BY  SMEAR    Neutrophils Relative % 77 %   Neutro Abs 3.8 1.7 - 7.7 K/uL   Lymphocytes Relative 10 %   Lymphs Abs 0.5 (L) 0.7 - 4.0 K/uL   Monocytes Relative 13 %   Monocytes Absolute 0.6 0.1 - 1.0 K/uL   Eosinophils Relative 0 %   Eosinophils Absolute 0.0 0.0 - 0.7 K/uL   Basophils Relative 0 %   Basophils Absolute 0.0 0.0 - 0.1 K/uL  Comprehensive metabolic panel     Status: Abnormal   Collection Time: 05/06/15  3:52 PM  Result Value Ref Range   Sodium 138 135 -  145 mmol/L   Potassium 3.0 (L) 3.5 - 5.1 mmol/L   Chloride 106 101 - 111 mmol/L   CO2 24 22 - 32 mmol/L   Glucose, Bld 85 65 - 99 mg/dL   BUN 6 6 - 20 mg/dL   Creatinine, Ser 0.58 0.44 - 1.00 mg/dL   Calcium 7.6 (L) 8.9 - 10.3 mg/dL   Total Protein 6.8 6.5 - 8.1 g/dL   Albumin 3.0 (L) 3.5 - 5.0 g/dL   AST 93 (H) 15 - 41 U/L   ALT 39 14 - 54 U/L   Alkaline Phosphatase 134 (H) 38 - 126 U/L   Total Bilirubin 2.5 (H) 0.3 - 1.2 mg/dL   GFR calc non Af Amer >60 >60 mL/min   GFR calc Af Amer >60 >60 mL/min    Comment: (NOTE) The eGFR has been calculated using the CKD EPI equation. This calculation has not been validated in all clinical situations. eGFR's persistently <60 mL/min signify possible Chronic Kidney Disease.    Anion gap 8 5 - 15  Ethanol     Status: Abnormal   Collection Time: 05/06/15  3:52 PM  Result Value Ref Range   Alcohol, Ethyl (B) 9 (H) <5 mg/dL    Comment:        LOWEST DETECTABLE LIMIT FOR SERUM ALCOHOL IS 5 mg/dL FOR MEDICAL PURPOSES ONLY   Salicylate level     Status: None   Collection Time: 05/06/15  3:52 PM  Result Value Ref Range   Salicylate Lvl <8.3 2.8 - 30.0 mg/dL  Acetaminophen level     Status: Abnormal   Collection Time: 05/06/15  3:52 PM  Result Value Ref Range   Acetaminophen (Tylenol), Serum <10 (L) 10 - 30 ug/mL    Comment:        THERAPEUTIC CONCENTRATIONS VARY SIGNIFICANTLY. A RANGE OF 10-30 ug/mL MAY BE AN EFFECTIVE CONCENTRATION FOR MANY PATIENTS. HOWEVER, SOME ARE  BEST TREATED AT CONCENTRATIONS OUTSIDE THIS RANGE. ACETAMINOPHEN CONCENTRATIONS >150 ug/mL AT 4 HOURS AFTER INGESTION AND >50 ug/mL AT 12 HOURS AFTER INGESTION ARE OFTEN ASSOCIATED WITH TOXIC REACTIONS.   Troponin I     Status: None   Collection Time: 05/06/15  4:07 PM  Result Value Ref Range   Troponin I <0.03 <0.031 ng/mL    Comment:        NO INDICATION OF MYOCARDIAL INJURY.   Brain natriuretic peptide     Status: Abnormal   Collection Time: 05/06/15  4:07 PM  Result Value Ref Range   B Natriuretic Peptide 174.0 (H) 0.0 - 100.0 pg/mL  Urinalysis, Routine w reflex microscopic (not at [facility])     Status: None   Collection Time: 05/06/15  4:08 PM  Result Value Ref Range   Color, Urine YELLOW YELLOW   APPearance CLEAR CLEAR   Specific Gravity, Urine 1.015 1.005 - 1.030   pH 6.0 5.0 - 8.0   Glucose, UA NEGATIVE NEGATIVE mg/dL   Hgb urine dipstick NEGATIVE NEGATIVE   Bilirubin Urine NEGATIVE NEGATIVE   Ketones, ur NEGATIVE NEGATIVE mg/dL   Protein, ur NEGATIVE NEGATIVE mg/dL   Urobilinogen, UA 0.2 0.0 - 1.0 mg/dL   Nitrite NEGATIVE NEGATIVE   Leukocytes, UA NEGATIVE NEGATIVE    Comment: MICROSCOPIC NOT DONE ON URINES WITH NEGATIVE PROTEIN, BLOOD, LEUKOCYTES, NITRITE, OR GLUCOSE <1000 mg/dL.  Urine rapid drug screen (hosp performed)     Status: Abnormal   Collection Time: 05/06/15  4:08 PM  Result Value Ref Range   Opiates NONE DETECTED  NONE DETECTED   Cocaine POSITIVE (A) NONE DETECTED   Benzodiazepines NONE DETECTED NONE DETECTED   Amphetamines POSITIVE (A) NONE DETECTED   Tetrahydrocannabinol NONE DETECTED NONE DETECTED   Barbiturates NONE DETECTED NONE DETECTED    Comment:        DRUG SCREEN FOR MEDICAL PURPOSES ONLY.  IF CONFIRMATION IS NEEDED FOR ANY PURPOSE, NOTIFY LAB WITHIN 5 DAYS.        LOWEST DETECTABLE LIMITS FOR URINE DRUG SCREEN Drug Class       Cutoff (ng/mL) Amphetamine      1000 Barbiturate      200 Benzodiazepine   323 Tricyclics        557 Opiates          300 Cocaine          300 THC              50   POC occult blood, ED RN will collect     Status: None   Collection Time: 05/07/15  8:47 AM  Result Value Ref Range   Fecal Occult Bld NEGATIVE NEGATIVE    Metabolic Disorder Labs:  No results found for: HGBA1C, MPG No results found for: PROLACTIN Lab Results  Component Value Date   CHOL 122 10/19/2013   TRIG 44 10/19/2013   HDL 60 10/19/2013   LDLCALC 53 10/19/2013   LDLCALC 34 01/14/2013    Current Medications: Current Facility-Administered Medications  Medication Dose Route Frequency Provider Last Rate Last Dose  . albuterol (PROVENTIL HFA;VENTOLIN HFA) 108 (90 BASE) MCG/ACT inhaler 2 puff  2 puff Inhalation Q6H PRN Niel Hummer, NP   2 puff at 05/07/15 1756  . alum & mag hydroxide-simeth (MAALOX/MYLANTA) 200-200-20 MG/5ML suspension 30 mL  30 mL Oral Q4H PRN Niel Hummer, NP      . hydrOXYzine (ATARAX/VISTARIL) tablet 25 mg  25 mg Oral Q6H PRN Niel Hummer, NP      . ipratropium-albuterol (DUONEB) 0.5-2.5 (3) MG/3ML nebulizer solution 3 mL  3 mL Nebulization Q6H PRN Laverle Hobby, PA-C      . loperamide (IMODIUM) capsule 2-4 mg  2-4 mg Oral PRN Niel Hummer, NP      . LORazepam (ATIVAN) tablet 1 mg  1 mg Oral Q6H PRN Niel Hummer, NP      . LORazepam (ATIVAN) tablet 1 mg  1 mg Oral QID Niel Hummer, NP   1 mg at 05/07/15 2320   Followed by  . [START ON 05/09/2015] LORazepam (ATIVAN) tablet 1 mg  1 mg Oral TID Niel Hummer, NP       Followed by  . [START ON 05/10/2015] LORazepam (ATIVAN) tablet 1 mg  1 mg Oral BID Niel Hummer, NP       Followed by  . [START ON 05/11/2015] LORazepam (ATIVAN) tablet 1 mg  1 mg Oral Daily Niel Hummer, NP      . magnesium hydroxide (MILK OF MAGNESIA) suspension 30 mL  30 mL Oral Daily PRN Niel Hummer, NP      . methocarbamol (ROBAXIN) tablet 500 mg  500 mg Oral Q8H PRN Niel Hummer, NP   500 mg at 05/07/15 1819  . multivitamin with minerals tablet 1 tablet  1  tablet Oral Daily Niel Hummer, NP   1 tablet at 05/07/15 1802  . ondansetron (ZOFRAN-ODT) disintegrating tablet 4 mg  4 mg Oral Q6H PRN Niel Hummer, NP      .  thiamine (VITAMIN B-1) tablet 100 mg  100 mg Oral Daily Niel Hummer, NP      . traZODone (DESYREL) tablet 50 mg  50 mg Oral QHS PRN Niel Hummer, NP   50 mg at 05/07/15 2320   PTA Medications: Prescriptions prior to admission  Medication Sig Dispense Refill Last Dose  . albuterol (PROVENTIL HFA) 108 (90 BASE) MCG/ACT inhaler INHALE 2 PUFFS INTO THE LUNGS EVERY FOUR HOURS AS NEEDED FOR WHEEZING. 6.7 g 1 04/04/2015 at Unknown time  . albuterol (PROVENTIL) (2.5 MG/3ML) 0.083% nebulizer solution Take 3 mLs (2.5 mg total) by nebulization every 6 (six) hours as needed for wheezing or shortness of breath. 75 mL 3 04/04/2015 at Unknown time  . Cetirizine HCl (ZYRTEC ALLERGY PO) Take 1 tablet by mouth daily.    04/03/2015 at Unknown time  . dicyclomine (BENTYL) 20 MG tablet Take 1 tablet (20 mg total) by mouth every 6 (six) hours. 120 tablet 6 04/03/2015 at Unknown time  . furosemide (LASIX) 40 MG tablet TAKE 1 TABLET BY MOUTH ONCE DAILY. 30 tablet 0 04/03/2015 at Unknown time  . lactulose (CHRONULAC) 10 GM/15ML solution TAKE 15MLs BY MOUTH TWICE DAILY. 473 mL 1 Past Week at Unknown time  . omeprazole (PRILOSEC) 20 MG capsule Take 20 mg by mouth 2 (two) times daily.   04/03/2015 at Unknown time    Musculoskeletal: Strength & Muscle Tone: abnormal Gait & Station: unsteady Patient leans: Backward  Psychiatric Specialty Exam: Physical Exam  Review of Systems  Psychiatric/Behavioral: Positive for depression, suicidal ideas and substance abuse. Negative for hallucinations. The patient is nervous/anxious.     Blood pressure 121/55, pulse 98, temperature 98.3 F (36.8 C), resp. rate 16, SpO2 98 %.There is no weight on file to calculate BMI.  General Appearance: Disheveled  Eye Sport and exercise psychologist::  Fair  Speech:  Garbled  Volume:  Decreased  Mood:   Hopeless  Affect:  Congruent  Thought Process:  Circumstantial  Orientation:  Full (Time, Place, and Person)  Thought Content:  Negative  Suicidal Thoughts:  No  Homicidal Thoughts:  No  Memory:  Immediate;   Fair  Judgement:  Impaired  Insight:  Lacking  Psychomotor Activity:  Restlessness  Concentration:  Poor  Recall:  Poor  Fund of Knowledge:Poor  Language: Fair  Akathisia:  Negative  Handed:  Right  AIMS (if indicated):     Assets:  Desire for Improvement  ADL's:  Impaired  Cognition: Impaired,  Mild  Sleep:        Treatment Plan Summary: Plan Admit to [facility] observation Unit for safety and stabilzation. Further plans for disposition in am  Observation Level/Precautions:  Continuous Observation  Laboratory:  None  Psychotherapy:    Medications:  Alcohol detox protocol  Consultations:  N/A  Discharge Concerns:  Relapse Illicit drug and alcohol use  Estimated LOS:24 - 48 hours  Other:     I certify that inpatient services furnished can reasonably be expected to improve the patient's condition.   Brionne Mertz E 10/4/20164:58 AM

## 2015-05-08 NOTE — Progress Notes (Signed)
Did not attend group 

## 2015-05-08 NOTE — Discharge Summary (Signed)
  Patient is a 59 y/o WF, who was admitted to the [facility] observation unit due to substance induced mood disorder on 05/07/2015.  Patient was reportedly binge drinking and using crack cocaine over the past several days. The patient endorses using 500.00 dollars worth of crack over these past few days in addition too drinking 2 Smirnoff ICE drinks a daily. Patient is endorsing feeling sad, depressed and guilty because of her substance abuse relapse. The patient reportedly had been clean , dry and sober for 3 years. The patient has a hx of passive SI and SA, but is denying any lethality at this present time. The patient is also denying any AVH, paranoia and or delusional thoughts. Other triggers exacerbating her depressed mood include her continued declining health. She was started on the Ativan detox protocol due to symptoms of alcohol withdrawal to include agitation, anxiety, and tremors. Her son had expressed concern through collateral information that the patient had been making suicidal comments for a week. The patient denied this but clearly was a danger to self due to her recent substance abuse. Her CIWA scale was an eight yesterday evening and the patient appeared very restless. Patient's symptoms improved after a dose of ativan and use of an albuterol inhaler. Patient continues to be difficult to understand due to past history of surgery to vocal cords. She is calmer today and agreeable to inpatient treatment. Kristen Marsh appears to be a poor historian at this time and does not appear to be openly reporting her symptoms that were present prior to admission. On admission the patient was positive for amphetamines, cocaine, and alcohol. Patient was accepted to the 300 hall for further substance abuse treatment and medication management. Order in place to repeat basic metabolic panel for repeat potassium along with B Natriuretic peptide which was mildly elevated on 05/06/2015.

## 2015-05-08 NOTE — Progress Notes (Signed)
D: Pt transfered to 400 hall as per MD's orders. Pt has been asleep in bed for majority of this shift. Respirations noted and unlabored, though sometimes difficult to arouse due to sedation. Pt denied SI, HI, AVH and pain when assessed. Pt has had no behavioral outburst thus far. Pt's son dropped off some clothes,dentures and denture tabs for her upon her request. Pt alert at this time. OOB to bathroom X2, BM X 1.  Lab draw attempted X2 by but to no avail.  A: All medications administered as prescribed including PRN inhaler for c/o SOB and was tolerated well. Report called to receiving nurse Mariam, RN on adult unit. Continued support and availability provided to pt. Encouragement offered towards treatment compliance. Q 15 minutes checks maintained for pt's safety throughout pt's stay in Observation Unit.  R: Pt cooperative with lab draw done in Observation unit and with transfer procedure. Q 15 minutes checks maintained for pt's safety till transfer from Observation unit.

## 2015-05-08 NOTE — Tx Team (Signed)
Initial Interdisciplinary Treatment Plan   PATIENT STRESSORS: Health problems Substance abuse   PATIENT STRENGTHS: Average or above average intelligence Communication skills General fund of knowledge Motivation for treatment/growth Supportive family/friends   PROBLEM LIST: Problem List/Patient Goals Date to be addressed Date deferred Reason deferred Estimated date of resolution  "I want to get sober." 05/08/15           "I want to quit smoking." 05/08/15                                          DISCHARGE CRITERIA:  Improved stabilization in mood, thinking, and/or behavior Need for constant or close observation no longer present Reduction of life-threatening or endangering symptoms to within safe limits Verbal commitment to aftercare and medication compliance Withdrawal symptoms are absent or subacute and managed without 24-hour nursing intervention  PRELIMINARY DISCHARGE PLAN: Attend 12-step recovery group Outpatient therapy Return to previous living arrangement  PATIENT/FAMIILY INVOLVEMENT: This treatment plan has been presented to and reviewed with the patient, Kristen Marsh, and/or family member.  The patient and family have been given the opportunity to ask questions and make suggestions.  [name] 05/08/2015, 7:52 PM

## 2015-05-09 DIAGNOSIS — F1024 Alcohol dependence with alcohol-induced mood disorder: Secondary | ICD-10-CM

## 2015-05-09 DIAGNOSIS — F1424 Cocaine dependence with cocaine-induced mood disorder: Secondary | ICD-10-CM

## 2015-05-09 LAB — BASIC METABOLIC PANEL
ANION GAP: 4 — AB (ref 5–15)
BUN: 10 mg/dL (ref 6–20)
CO2: 25 mmol/L (ref 22–32)
Calcium: 8.1 mg/dL — ABNORMAL LOW (ref 8.9–10.3)
Chloride: 111 mmol/L (ref 101–111)
Creatinine, Ser: 0.5 mg/dL (ref 0.44–1.00)
GLUCOSE: 90 mg/dL (ref 65–99)
POTASSIUM: 4.2 mmol/L (ref 3.5–5.1)
Sodium: 140 mmol/L (ref 135–145)

## 2015-05-09 LAB — BRAIN NATRIURETIC PEPTIDE: B Natriuretic Peptide: 62.2 pg/mL (ref 0.0–100.0)

## 2015-05-09 NOTE — [facility] Counselor (Signed)
CSW approached Pt in attempts to complete PSA. Pt was minimally responsive and has much difficulty talking. CSW will attempt tomorrow.  Peri Maris, [place] [employer] [phone]

## 2015-05-09 NOTE — Progress Notes (Signed)
D: Pt is resting in her room with eyes closed. No distress noted.  A: 1:1 staff remains with pt for safety.  R: Pt remains safe on the unit.

## 2015-05-09 NOTE — [facility] Group Notes (Signed)
[place] LCSW Group Therapy 05/09/2015 1:15 PM  Type of Therapy: Group Therapy- Emotion Regulation  Pt did not attend, declined invitation.   Peri Maris, LCSWA 05/09/2015 3:33 PM

## 2015-05-09 NOTE — H&P (Addendum)
Psychiatric Admission Assessment Adult  Patient Identification: Kristen Marsh MRN:  [id number] Date of Evaluation:  05/09/2015 Chief Complaint:   " I have been drinking and using "  Principal Diagnosis: Alcohol Dependence, cocaine Dependence  Diagnosis:   Patient Active Problem List   Diagnosis Date Noted  . Polysubstance abuse [F19.10] 05/07/2015  . Ascites due to alcoholic cirrhosis ([place]) [J67.34] 02/28/2015  . Ascites [R18.8] 02/28/2015  . GAD (generalized anxiety disorder) [F41.1] 10/11/2014  . IBS (irritable bowel syndrome) [K58.9] 10/11/2014  . COPD bronchitis [J44.9] 10/15/2013  . Alcoholism /alcohol abuse ([place]) [F10.20] 10/15/2013  . Cirrhosis of liver ([place]) [K74.60] 08/18/2013  . Degenerative cervical disc [M50.30] 08/18/2013  . Fibromyalgia [M79.7] 08/18/2013  . Muscle cramps [R25.2] 08/18/2013  . GERD (gastroesophageal reflux disease) [K21.9] 08/18/2013   History of Present Illness::  Patient is a 59 year old female. She is a fair historian, currently drowsy, although easily alertable by calling her name,  and communication is further  difficulted by hoarseness/ low volume of speech, which she attributes to vocal cord dysfunction ( chronic )  States she has been drinking  And using crack cocaine regularly  " for a little while". As per chart notes, patient was brought to hospital by family, due to concerns about her safety and making suicidal statements of wanting to die and being unable to continue living . She had reportedly not slept for several days prior to admission, and presented very agitated, restless, needing sedation in ER. Patient states she has been sad, depressed . Chart notes indicate patient reported depression related to relapse, inability to stop using drugs .  Patient unable to quantify how much she was drinking, but states " a lot ".  As per chart notes , patient was drinking 2 spiked lemonade type drinks daily, and was using about $ 100 dollars worth of crack  cocaine daily. At this time does not endorse severe withdrawal symptoms , and does not appear to be in any acute distress or discomfort. Mild psychomotor restlessness, but no overt agitation, no distal tremors, no diaphoresis. She states, regarding withdrawal symptoms " I think yesterday was worse ".  Of note , Admission BAL 9, UDS positive for amphetamines and for cocaine, negative of BZDs or opiates .  Associated Signs/Symptoms: Depression Symptoms:   Describes depression, sadness related to drug/alcohol abuse. Sleep and appetite have been poor. Self esteem is " not good ".  (Hypo) Manic Symptoms:   Does not endorse- insomnia, and psychomotor agitation upon admission likely to be related to cocaine intoxication. Anxiety Symptoms:   Reports " anxiety"  Psychotic Symptoms:  at this time denies  PTSD Symptoms: does not endorse  Total Time spent with patient: 45 minutes  Past Psychiatric History:  As noted above, fair historian, provides limited information at this time- Describes history of depression, describes history of alcohol and cocaine dependencies. Does not endorse prior psychiatric admissions or suicidal attempts .Was not on any psychiatric medications prior to admission.  Risk to Self: Is patient at risk for suicide?: No Risk to Others:   Prior Inpatient Therapy:   Prior Outpatient Therapy:    Alcohol Screening: Patient refused Alcohol Screening Tool: Yes Substance Abuse History in the last 12 months:   Alcohol dependence and cocaine dependence.  Consequences of Substance Abuse: Substance induced mood disorder  Previous Psychotropic Medications:  Does not endorse  Psychological Evaluations:  No  Past Medical History:  Past Medical History  Diagnosis Date  . Osteoarthritis   .  Hep C w/o coma, chronic ([place])   . Emphysema   . GERD (gastroesophageal reflux disease)   . IBS (irritable bowel syndrome)   . 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 MRSA carrier   . Fibromyalgia   .  Shortness of breath     with exertion  . Hypertension   . Pneumonia     As a child  . Anxiety   . Kidney stones   . Depression   . Cirrhosis of liver ([place])   . Varicose vein     Of esophagus and top of stomach per pt    Past Surgical History  Procedure Laterality Date  . Hysteroscopy    . Appendectomy    . Abdominal hysterectomy    . Diagnostic laparoscopy      exploratory lap  . Colonoscopy w/ biopsies and polypectomy    . Direct laryngoscopy N/A 03/22/2014    Procedure: DIRECT LARYNGOSCOPY;  Surgeon: Ruby Cola, MD;  Location: [facility];  Service: ENT;  Laterality: N/A;  With biopsy  . Esophagogastroduodenoscopy (egd) with propofol N/A 07/26/2014    Procedure: ESOPHAGOGASTRODUODENOSCOPY (EGD) WITH PROPOFOL;  Surgeon: Rogene Houston, MD;  Location: [facility];  Service: Endoscopy;  Laterality: N/A;  . Direct laryngoscopy N/A 01/15/2015    Procedure: DIRECT LARYNGOSCOPY WITH BIOPSY;  Surgeon: Leta Baptist, MD;  Location: [facility];  Service: ENT;  Laterality: N/A;   Family History:   Family History  Problem Relation Age of Onset  . Cancer Mother     esophageal  . Cancer Brother     Liver cancer and cirrhosis age 37   Family Psychiatric  History:  Does not endorse  Social History: Patient divorced, not employed at present, has adult children History  Alcohol Use  . 2.4 oz/week  . 4 Cans of beer per week    Comment: 1 smirnoff a day.      History  Drug Use  . Yes  . Special: Cocaine, "Crack" cocaine    Comment: Haven't used cocaine in the last 3 years    Social History   Social History  . Marital Status: Divorced    Spouse Name: N/A  . Number of Children: N/A  . Years of Education: N/A   Social History Main Topics  . Smoking status: Current Every Day Smoker -- 0.50 packs/day for 40 years    Types: Cigarettes  . Smokeless tobacco: Never Used     Comment: 1/2 pack a day at least 40 yrs  . Alcohol Use: 2.4 oz/week    4 Cans of beer per week     Comment:  1 smirnoff a day.   . Drug Use: Yes    Special: Cocaine, "Crack" cocaine     Comment: Haven't used cocaine in the last 3 years  . Sexual Activity: No   Other Topics Concern  . None   Social History Narrative   Additional Social History:  Allergies:   Allergies  Allergen Reactions  . Codeine Itching  . Hydrocodone-Acetaminophen Itching  . Meloxicam Swelling    Face swelling  . Montelukast Cough   Lab Results:  Results for orders placed or performed during the hospital encounter of 05/07/15 (from the past 48 hour(s))  Basic metabolic panel     Status: Abnormal   Collection Time: 05/09/15  6:49 AM  Result Value Ref Range   Sodium 140 135 - 145 mmol/L   Potassium 4.2 3.5 - 5.1 mmol/L  Chloride 111 101 - 111 mmol/L   CO2 25 22 - 32 mmol/L   Glucose, Bld 90 65 - 99 mg/dL   BUN 10 6 - 20 mg/dL   Creatinine, Ser 0.50 0.44 - 1.00 mg/dL   Calcium 8.1 (L) 8.9 - 10.3 mg/dL   GFR calc non Af Amer >60 >60 mL/min   GFR calc Af Amer >60 >60 mL/min    Comment: (NOTE) The eGFR has been calculated using the CKD EPI equation. This calculation has not been validated in all clinical situations. eGFR's persistently <60 mL/min signify possible Chronic Kidney Disease.    Anion gap 4 (L) 5 - 15    Comment: Performed at [facility]  Brain natriuretic peptide     Status: None   Collection Time: 05/09/15  6:49 AM  Result Value Ref Range   B Natriuretic Peptide 62.2 0.0 - 100.0 pg/mL    Comment: Performed at [facility]    Metabolic Disorder Labs:  No results found for: HGBA1C, MPG No results found for: PROLACTIN Lab Results  Component Value Date   CHOL 122 10/19/2013   TRIG 44 10/19/2013   HDL 60 10/19/2013   LDLCALC 53 10/19/2013   LDLCALC 34 01/14/2013    Current Medications: Current Facility-Administered Medications  Medication Dose Route Frequency Provider Last Rate Last Dose  . albuterol (PROVENTIL HFA;VENTOLIN HFA) 108 (90 BASE)  MCG/ACT inhaler 2 puff  2 puff Inhalation Q6H PRN Niel Hummer, NP   2 puff at 05/09/15 1035  . alum & mag hydroxide-simeth (MAALOX/MYLANTA) 200-200-20 MG/5ML suspension 30 mL  30 mL Oral Q4H PRN Niel Hummer, NP      . hydrOXYzine (ATARAX/VISTARIL) tablet 25 mg  25 mg Oral Q6H PRN Niel Hummer, NP      . ipratropium-albuterol (DUONEB) 0.5-2.5 (3) MG/3ML nebulizer solution 3 mL  3 mL Nebulization Q6H PRN Laverle Hobby, PA-C   3 mL at 05/09/15 1053  . loperamide (IMODIUM) capsule 2-4 mg  2-4 mg Oral PRN Niel Hummer, NP   4 mg at 05/08/15 2132  . LORazepam (ATIVAN) tablet 1 mg  1 mg Oral Q6H PRN Niel Hummer, NP      . LORazepam (ATIVAN) tablet 1 mg  1 mg Oral TID Niel Hummer, NP   1 mg at 05/09/15 1036   Followed by  . [START ON 05/10/2015] LORazepam (ATIVAN) tablet 1 mg  1 mg Oral BID Niel Hummer, NP       Followed by  . [START ON 05/11/2015] LORazepam (ATIVAN) tablet 1 mg  1 mg Oral Daily Niel Hummer, NP      . magnesium hydroxide (MILK OF MAGNESIA) suspension 30 mL  30 mL Oral Daily PRN Niel Hummer, NP      . methocarbamol (ROBAXIN) tablet 500 mg  500 mg Oral Q8H PRN Niel Hummer, NP   500 mg at 05/09/15 1053  . multivitamin with minerals tablet 1 tablet  1 tablet Oral Daily Niel Hummer, NP   1 tablet at 05/09/15 1035  . ondansetron (ZOFRAN-ODT) disintegrating tablet 4 mg  4 mg Oral Q6H PRN Niel Hummer, NP      . potassium chloride SA (K-DUR,KLOR-CON) CR tablet 20 mEq  20 mEq Oral BID Laverle Hobby, PA-C   20 mEq at 05/09/15 1036  . thiamine (VITAMIN B-1) tablet 100 mg  100 mg Oral Daily Niel Hummer, NP   100 mg  at 05/09/15 1036  . traZODone (DESYREL) tablet 50 mg  50 mg Oral QHS PRN Niel Hummer, NP   50 mg at 05/07/15 2320   PTA Medications: Prescriptions prior to admission  Medication Sig Dispense Refill Last Dose  . albuterol (PROVENTIL HFA) 108 (90 BASE) MCG/ACT inhaler INHALE 2 PUFFS INTO THE LUNGS EVERY FOUR HOURS AS NEEDED FOR WHEEZING. 6.7 g 1 04/04/2015 at  Unknown time  . albuterol (PROVENTIL) (2.5 MG/3ML) 0.083% nebulizer solution Take 3 mLs (2.5 mg total) by nebulization every 6 (six) hours as needed for wheezing or shortness of breath. 75 mL 3 04/04/2015 at Unknown time  . Cetirizine HCl (ZYRTEC ALLERGY PO) Take 1 tablet by mouth daily.    04/03/2015 at Unknown time  . dicyclomine (BENTYL) 20 MG tablet Take 1 tablet (20 mg total) by mouth every 6 (six) hours. 120 tablet 6 04/03/2015 at Unknown time  . furosemide (LASIX) 40 MG tablet TAKE 1 TABLET BY MOUTH ONCE DAILY. 30 tablet 0 04/03/2015 at Unknown time  . lactulose (CHRONULAC) 10 GM/15ML solution TAKE 15MLs BY MOUTH TWICE DAILY. 473 mL 1 Past Week at Unknown time  . omeprazole (PRILOSEC) 20 MG capsule Take 20 mg by mouth 2 (two) times daily.   04/03/2015 at Unknown time    Musculoskeletal: Strength & Muscle Tone: within normal limits Gait & Station: gait not examined, patient in bed Patient leans: N/A  Psychiatric Specialty Exam: Physical Exam  Review of Systems  Constitutional: Negative.   HENT: Negative.   Eyes: Negative.   Respiratory: Negative.   Cardiovascular: Negative.   Gastrointestinal: Negative.   Genitourinary: Negative.   Musculoskeletal: Negative.   Skin: Negative.   Neurological: Negative for seizures.  Endo/Heme/Allergies: Negative.   Psychiatric/Behavioral: Positive for depression and substance abuse.  All other systems reviewed and are negative.   Blood pressure 127/85, pulse 93, temperature 98 F (36.7 C), temperature source Axillary, resp. rate 20, SpO2 98 %.There is no weight on file to calculate BMI.  General Appearance: Fairly Groomed  Engineer, water::  Poor  Speech:  Slow  Volume:  Decreased- speaks with a hoarse, whispering intoniation  Mood:  describes depression  Affect:  Constricted  Thought Process:  slowed, but linear, does not appear grossly thought disordered at this time  Orientation:  Full (Time, Place, and Person)- knows she is at [facility], [place],  knows month, year, missed day of week ( Tuesday), oriented to self , does not appear grossly confused or delirious at this time. At this time patient drowsy but alertable by calling her name .   Thought Content:  denies hallucinations, no delusions expressed   Suicidal Thoughts:  No- at this time denies any thoughts of hurting self or of Si  Homicidal Thoughts:  No  Memory:  recent and remote fair   Judgement:  Poor  Insight:  Fair  Psychomotor Activity:  slight restlessness in bed, but no overt agitation. no tremors, no diaphoresis  Concentration:  Fair  Recall:  [place]: Fair  Akathisia:  NA  Handed:  Right  AIMS (if indicated):     Assets:  Desire for Improvement Resilience  ADL's:  Impaired  Cognition: WNL  Sleep:  Number of Hours: 5.75     Treatment Plan Summary: Daily contact with patient to assess and evaluate symptoms and progress in treatment, Medication management, Plan inpatient admission and medications as below   Observation Level/Precautions:  1 to 1- for fall safety ( chart reports  several falls in recent past )   Laboratory:   As needed   Psychotherapy:  Milieu,  Groups , support, encouragement   Medications:  Ativan detox protocol to help minimize risk of alcohol WDL.   Because of present sedation will change from standing BZD taper to PRN Ativan as needed for alcohol WDL symptoms, in order to minimize BZD related sedation.  Have reviewed possible antidepressant trial with patient, but will review/ reassess once more cooperative / awake .  Consultations:  As needed   Discharge Concerns:  Long history of substance dependence   Estimated LOS:6 days   Other:     I certify that inpatient services furnished can reasonably be expected to improve the patient's condition.   Kaydn Kumpf 10/5/20162:37 PM

## 2015-05-09 NOTE — Progress Notes (Signed)
1:1 nursing note D: Patient resting in bed with eyes closed.  Respirations even and unlabored.  Patient appears to be in no apparent distress. A: Staff to monitor Q 15 mins for safety.  Patient remains on 1:1 for fall risk safety. R:Patient remains safe on the unit.

## 2015-05-09 NOTE — BHH Group Notes (Signed)
[facility] LCSW Aftercare Discharge Planning Group Note  05/09/2015 8:45 AM  Pt did not attend, declined invitation.   Peri Maris, [place] 05/09/2015 9:21 AM

## 2015-05-09 NOTE — Progress Notes (Signed)
Lab had to go into patients room to draw blood this AM.

## 2015-05-09 NOTE — Progress Notes (Signed)
1:1 observation note:  Patient has been resting quietly in her room.  Sitter present due high fall risk.

## 2015-05-09 NOTE — Progress Notes (Signed)
D: Pt is resting in her room with eyes closed. Patient arouses with spoken to. Patient requested PRN for muscle cramps and something to to help her sleep. PRN Robaxin and Trazodone  was given at 2202.  A: 1:1 staff remains with pt for safety.  R: Pt remains safe on the unit.

## 2015-05-09 NOTE — Progress Notes (Signed)
Pt did not attend wrap up group this evening.  

## 2015-05-09 NOTE — Progress Notes (Signed)
Recreation Therapy Notes   Date: 10.05.2016 Time: 9:30am Location: [address] Group Room   Group Topic: Stress Management  Goal Area(s) Addresses:  Patient will actively participate in stress management techniques presented during session.   Behavioral Response: Did not attend.   Laureen Ochs Mahdi Frye, LRT/CTRS        Janaysia Mcleroy L 05/09/2015 1:13 PM

## 2015-05-09 NOTE — Progress Notes (Signed)
Patient 1:1 Note  D: Patient resting in bed with eyes closed.  Respirations even and unlabored.  Patient appears to be in no apparent distress. A: Staff to monitor Q 15 mins for safety.   R:Patient remains safe on the unit.

## 2015-05-09 NOTE — BHH Suicide Risk Assessment (Signed)
[facility] Admission Suicide Risk Assessment   Nursing information obtained from:   patient and chart  Demographic factors:   59 year old female, unemployed Current Mental Status:   see below  Loss Factors:   recent relapse on cocaine and alcohol Historical Factors:   polysubstance dependence  Risk Reduction Factors:   social support, resilience  Total Time spent with patient: 45 minutes Principal Problem:  Alcohol Dependence, Cocaine Dependence , Substance Induced Mood Disorder, Depressed  Diagnosis:   Patient Active Problem List   Diagnosis Date Noted  . Polysubstance abuse [F19.10] 05/07/2015  . Ascites due to alcoholic cirrhosis ([place]) [V69.45] 02/28/2015  . Ascites [R18.8] 02/28/2015  . GAD (generalized anxiety disorder) [F41.1] 10/11/2014  . IBS (irritable bowel syndrome) [K58.9] 10/11/2014  . COPD bronchitis [J44.9] 10/15/2013  . Alcoholism /alcohol abuse ([place]) [F10.20] 10/15/2013  . Cirrhosis of liver ([place]) [K74.60] 08/18/2013  . Degenerative cervical disc [M50.30] 08/18/2013  . Fibromyalgia [M79.7] 08/18/2013  . Muscle cramps [R25.2] 08/18/2013  . GERD (gastroesophageal reflux disease) [K21.9] 08/18/2013     Continued Clinical Symptoms:    The "Alcohol Use Disorders Identification Test", Guidelines for Use in Primary Care, Second Edition.  World [employer] [facility]). Score between 0-7:  no or low risk or alcohol related problems. Score between 8-15:  moderate risk of alcohol related problems. Score between 16-19:  high risk of alcohol related problems. Score 20 or above:  warrants further diagnostic evaluation for alcohol dependence and treatment.   CLINICAL FACTORS:  59 year old female, admitted under IVC , initiated by family members - she has a history of substance dependence, and had relapsed on alcohol and cocaine , reportedly had been awake for several days prior to her admission and upon admission to ED presented agitated, restless . Patient depressed and voicing  SI prior to admission.     Psychiatric Specialty Exam: Physical Exam  ROS  Blood pressure 127/85, pulse 93, temperature 98 F (36.7 C), temperature source Axillary, resp. rate 20, SpO2 98 %.There is no weight on file to calculate BMI.  See admit note MSE                                                        COGNITIVE FEATURES THAT CONTRIBUTE TO RISK:  Closed-mindedness and Loss of executive function    SUICIDE RISK:   Moderate:  Frequent suicidal ideation with limited intensity, and duration, some specificity in terms of plans, no associated intent, good self-control, limited dysphoria/symptomatology, some risk factors present, and identifiable protective factors, including available and accessible social support.  PLAN OF CARE: Patient will be admitted to inpatient psychiatric unit for stabilization and safety. Will provide and encourage milieu participation. Provide medication management and maked adjustments as needed. Will also provide mediation management to minimize risk of alcohol WDL.   Will follow daily.    Medical Decision Making:  Review of Psycho-Social Stressors (1), Review or order clinical lab tests (1), Established Problem, Worsening (2) and Review of Medication Regimen & Side Effects (2)  I certify that inpatient services furnished can reasonably be expected to improve the patient's condition.   Bentley Fissel, [place] 05/09/2015, 3:12 PM

## 2015-05-09 NOTE — Progress Notes (Signed)
D: Patient receiving nebulizer treatment due shortness of breath.  Also administered inhaler with good results.  Patient is weak and emancipated.  She is a one assist out of bed.  She denies SI/HI/AVH.  Patient is difficult to understand because of her recent larynx surgery.  Patient talks just above a whisper.  She requested pain medication and was given robaxin.  Patient had diarrhea last night which seems to be resolved.  She refused imodium. A: Continue to monitor medication management and MD orders.  Safety checks completed every 15 minutes per protocol.  Offer encouragement and support as needed. R:  Patient will stay on 1:1 due to high fall risk.

## 2015-05-10 ENCOUNTER — Other Ambulatory Visit: Payer: Self-pay | Admitting: Family

## 2015-05-10 LAB — HEPATIC FUNCTION PANEL
ALBUMIN: 2.7 g/dL — AB (ref 3.5–5.0)
ALT: 41 U/L (ref 14–54)
AST: 61 U/L — AB (ref 15–41)
Alkaline Phosphatase: 118 U/L (ref 38–126)
BILIRUBIN TOTAL: 2 mg/dL — AB (ref 0.3–1.2)
Bilirubin, Direct: 0.7 mg/dL — ABNORMAL HIGH (ref 0.1–0.5)
Indirect Bilirubin: 1.3 mg/dL — ABNORMAL HIGH (ref 0.3–0.9)
TOTAL PROTEIN: 6.5 g/dL (ref 6.5–8.1)

## 2015-05-10 MED ORDER — SERTRALINE HCL 25 MG PO TABS
25.0000 mg | ORAL_TABLET | Freq: Every day | ORAL | Status: DC
  Administered 2015-05-10 – 2015-05-11 (×2): 25 mg via ORAL
  Filled 2015-05-10 (×4): qty 1

## 2015-05-10 MED ORDER — ENSURE ENLIVE PO LIQD: 237.0000 mL | Freq: Two times a day (BID) | ORAL | Status: DC

## 2015-05-10 NOTE — BHH Group Notes (Signed)
The focus of this group is to educate the patient on the purpose and policies of crisis stabilization and provide a format to answer questions about their admission.  The group details unit policies and expectations of patients while admitted.  Patient did not attend 0900 nurse education orientation group this morning.  Patient stayed in bed sleeping with 1:1 present.

## 2015-05-10 NOTE — BHH Counselor (Signed)
CSW attempted to complete PSA with Pt but was only able to get limited information. CSW called and left message with Pt's son Corene Cornea [phone]) in attempt to complete PSA and obtain collateral information after getting verbal consent from Pt (MD Cobos as witness).  CSW awaiting call back.  Peri Maris, [place] Work [phone]

## 2015-05-10 NOTE — Progress Notes (Addendum)
D: Patient up in bathroom. Patient continues to remain safe and able to transfer without assistance.  A: 1:1 staff remains with pt for safety.  R: Pt remains safe on the unit.

## 2015-05-10 NOTE — Progress Notes (Addendum)
1:1    0800  Patient laying in bed with eyes closed resting with 1:1 present.  Respirations even and unlabored.  No signs/symptoms of pain/distress noted on patient's face/body movements.  1:1 continues for safety per MD order.  1110  Patient in bed, requested pain and anxiety medication.  1:1 present for safety.  Denied SI and HI, contracts for safety.  Emotional support and encouragement given patient.  1:1 continues for safety per MD order.  1430  Patient continues to lay in bed, with eyes closed, eats a few bites, then goes back to sleep.   Patient has been to the bathroom several times to urinate.  Denied SI and HI, contracts for safety.  Denied A/V hallucinations.  Safety maintained with 1:1 present per MD orders.

## 2015-05-10 NOTE — Progress Notes (Signed)
1:1   1900  Patient laying in bed with eyes closed.  No signs/symptoms of pain/distress noted on patient's face/body movements.  Patient ate very small amount of dinner tonight, approximately 10%.  Has been to bathroom to urinate several times this afternoon.  Respirations even and unlabored.  Emotional support and encouragement given patient.  1:1 for safety continues per MD order.

## 2015-05-10 NOTE — BHH Group Notes (Signed)
[facility] Group Therapy 05/10/2015 1:15pm  Type of Therapy: [facility] Presentation  Pt did not attend, declined invitation.   Peri Maris, LCSWA 05/10/2015 1:19 PM

## 2015-05-10 NOTE — Progress Notes (Signed)
[facility] MD Progress Note  05/10/2015 1:13 PM Kristen Marsh  MRN:  [id number] Subjective:   Patient complains of chronic pain, affecting mostly " my knees, my joints ". She is not endorsing severe symptoms of WDL- denies visual disturbances, denies severe nausea or vomiting, denies feeling shaky or jittery. Objective : I have discussed case with treatment team and have met with patient. Patient in bed most of the time but up to go to the bathroom, and today, with encouragement, did get up, walked around the room with assistance, and sat on chair.  She is more communicative than yesterday. States she was drinking 2-3 " tall" beers of stronger alcohol content than normal beers . States she was abusing Adderall and cocaine . Regarding her speech difficulties - speaks with extreme hoarseness, whisper - states this started about a year ago, and that she has had extensive workup " but nobody knows why it is going on". She denies, as above, any severe symptoms of withdrawal at this time. Has minimal distal tremors, no diaphoresis, does not appear agitated or restless. Vitals are stable . She denies any history of withdrawal seizures or of DTs . Reports depression, sadness, anxiety, but denies any suicidal ideations, and expresses a desire to continue improving.  At this time does not endorse medication side effects. Her group, milieu participation has been minimal thus far, has been staying in her room.  Of note, she is on one to one observation for safety- fall risk.   Principal Problem: Alcohol Dependence, cocaine Dependence , Substance Induced Mood Disorder- Depressed  Diagnosis:   Patient Active Problem List   Diagnosis Date Noted  . Polysubstance abuse [F19.10] 05/07/2015  . Ascites due to alcoholic cirrhosis ([place]) [V61.22] 02/28/2015  . Ascites [R18.8] 02/28/2015  . GAD (generalized anxiety disorder) [F41.1] 10/11/2014  . IBS (irritable bowel syndrome) [K58.9] 10/11/2014  . COPD bronchitis [J44.9]  10/15/2013  . Alcoholism /alcohol abuse ([place]) [F10.20] 10/15/2013  . Cirrhosis of liver ([place]) [K74.60] 08/18/2013  . Degenerative cervical disc [M50.30] 08/18/2013  . Fibromyalgia [M79.7] 08/18/2013  . Muscle cramps [R25.2] 08/18/2013  . GERD (gastroesophageal reflux disease) [K21.9] 08/18/2013   Total Time spent with patient: 25 minutes    Past Medical History:  Past Medical History  Diagnosis Date  . Osteoarthritis   . Hep C w/o coma, chronic ([place])   . Emphysema   . GERD (gastroesophageal reflux disease)   . IBS (irritable bowel syndrome)   . 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 MRSA carrier   . Fibromyalgia   . Shortness of breath     with exertion  . Hypertension   . Pneumonia     As a child  . Anxiety   . Kidney stones   . Depression   . Cirrhosis of liver ([place])   . Varicose vein     Of esophagus and top of stomach per pt    Past Surgical History  Procedure Laterality Date  . Hysteroscopy    . Appendectomy    . Abdominal hysterectomy    . Diagnostic laparoscopy      exploratory lap  . Colonoscopy w/ biopsies and polypectomy    . Direct laryngoscopy N/A 03/22/2014    Procedure: DIRECT LARYNGOSCOPY;  Surgeon: Ruby Cola, MD;  Location: [place];  Service: ENT;  Laterality: N/A;  With biopsy  . Esophagogastroduodenoscopy (egd) with propofol N/A 07/26/2014    Procedure: ESOPHAGOGASTRODUODENOSCOPY (EGD) WITH PROPOFOL;  Surgeon: Rogene Houston, MD;  Location: [facility];  Service: Endoscopy;  Laterality: N/A;  . Direct laryngoscopy N/A 01/15/2015    Procedure: DIRECT LARYNGOSCOPY WITH BIOPSY;  Surgeon: Leta Baptist, MD;  Location: [place];  Service: ENT;  Laterality: N/A;   Family History:  Family History  Problem Relation Age of Onset  . Cancer Mother     esophageal  . Cancer Brother     Liver cancer and cirrhosis age 28   Social History:  History  Alcohol Use  . 2.4 oz/week  . 4 Cans of beer per week    Comment: 1 smirnoff a day.      History   Drug Use  . Yes  . Special: Cocaine, "Crack" cocaine    Comment: Haven't used cocaine in the last 3 years    Social History   Social History  . Marital Status: Divorced    Spouse Name: N/A  . Number of Children: N/A  . Years of Education: N/A   Social History Main Topics  . Smoking status: Current Every Day Smoker -- 0.50 packs/day for 40 years    Types: Cigarettes  . Smokeless tobacco: Never Used     Comment: 1/2 pack a day at least 40 yrs  . Alcohol Use: 2.4 oz/week    4 Cans of beer per week     Comment: 1 smirnoff a day.   . Drug Use: Yes    Special: Cocaine, "Crack" cocaine     Comment: Haven't used cocaine in the last 3 years  . Sexual Activity: No   Other Topics Concern  . None   Social History Narrative   Additional Social History:   Sleep: Fair  Appetite:  Poor  Current Medications: Current Facility-Administered Medications  Medication Dose Route Frequency Provider Last Rate Last Dose  . albuterol (PROVENTIL HFA;VENTOLIN HFA) 108 (90 BASE) MCG/ACT inhaler 2 puff  2 puff Inhalation Q6H PRN Niel Hummer, NP   2 puff at 05/10/15 1107  . alum & mag hydroxide-simeth (MAALOX/MYLANTA) 200-200-20 MG/5ML suspension 30 mL  30 mL Oral Q4H PRN Niel Hummer, NP      . hydrOXYzine (ATARAX/VISTARIL) tablet 25 mg  25 mg Oral Q6H PRN Niel Hummer, NP   25 mg at 05/10/15 1109  . ipratropium-albuterol (DUONEB) 0.5-2.5 (3) MG/3ML nebulizer solution 3 mL  3 mL Nebulization Q6H PRN Laverle Hobby, PA-C   3 mL at 05/09/15 1053  . loperamide (IMODIUM) capsule 2-4 mg  2-4 mg Oral PRN Niel Hummer, NP   4 mg at 05/08/15 2132  . LORazepam (ATIVAN) tablet 1 mg  1 mg Oral Q6H PRN Myer Peer Cobos, MD      . magnesium hydroxide (MILK OF MAGNESIA) suspension 30 mL  30 mL Oral Daily PRN Niel Hummer, NP      . methocarbamol (ROBAXIN) tablet 500 mg  500 mg Oral Q8H PRN Niel Hummer, NP   500 mg at 05/10/15 1109  . multivitamin with minerals tablet 1 tablet  1 tablet Oral Daily Niel Hummer, NP   1 tablet at 05/10/15 0847  . ondansetron (ZOFRAN-ODT) disintegrating tablet 4 mg  4 mg Oral Q6H PRN Niel Hummer, NP      . thiamine (VITAMIN B-1) tablet 100 mg  100 mg Oral Daily Niel Hummer, NP   100 mg at 05/10/15 0847  . traZODone (DESYREL) tablet 50 mg  50 mg Oral QHS PRN Niel Hummer, NP   50  mg at 05/09/15 2202    Lab Results:  Results for orders placed or performed during the hospital encounter of 05/07/15 (from the past 48 hour(s))  Basic metabolic panel     Status: Abnormal   Collection Time: 05/09/15  6:49 AM  Result Value Ref Range   Sodium 140 135 - 145 mmol/L   Potassium 4.2 3.5 - 5.1 mmol/L   Chloride 111 101 - 111 mmol/L   CO2 25 22 - 32 mmol/L   Glucose, Bld 90 65 - 99 mg/dL   BUN 10 6 - 20 mg/dL   Creatinine, Ser 0.50 0.44 - 1.00 mg/dL   Calcium 8.1 (L) 8.9 - 10.3 mg/dL   GFR calc non Af Amer >60 >60 mL/min   GFR calc Af Amer >60 >60 mL/min    Comment: (NOTE) The eGFR has been calculated using the CKD EPI equation. This calculation has not been validated in all clinical situations. eGFR's persistently <60 mL/min signify possible Chronic Kidney Disease.    Anion gap 4 (L) 5 - 15    Comment: Performed at [facility]  Brain natriuretic peptide     Status: None   Collection Time: 05/09/15  6:49 AM  Result Value Ref Range   B Natriuretic Peptide 62.2 0.0 - 100.0 pg/mL    Comment: Performed at [facility]  Hepatic function panel     Status: Abnormal   Collection Time: 05/10/15  6:35 AM  Result Value Ref Range   Total Protein 6.5 6.5 - 8.1 g/dL   Albumin 2.7 (L) 3.5 - 5.0 g/dL   AST 61 (H) 15 - 41 U/L   ALT 41 14 - 54 U/L   Alkaline Phosphatase 118 38 - 126 U/L   Total Bilirubin 2.0 (H) 0.3 - 1.2 mg/dL   Bilirubin, Direct 0.7 (H) 0.1 - 0.5 mg/dL   Indirect Bilirubin 1.3 (H) 0.3 - 0.9 mg/dL    Comment: Performed at [facility]    Physical Findings: AIMS: Facial and Oral  Movements Muscles of Facial Expression: None, normal Lips and Perioral Area: None, normal Jaw: None, normal Tongue: None, normal,Extremity Movements Upper (arms, wrists, hands, fingers): Mild (NP aware) Lower (legs, knees, ankles, toes): Mild, Trunk Movements Neck, shoulders, hips: Moderate, Overall Severity Severity of abnormal movements (highest score from questions above): Moderate Incapacitation due to abnormal movements: Moderate Patient's awareness of abnormal movements (rate only patient's report): Aware, no distress, Dental Status Current problems with teeth and/or dentures?: Yes Does patient usually wear dentures?: No (partials)  CIWA:  CIWA-Ar Total: 0 COWS:  COWS Total Score: 8  Musculoskeletal: Strength & Muscle Tone: within normal limits Gait & Station: unsteady Patient leans: N/A  Psychiatric Specialty Exam: ROS- describes joint, knee pains, denies nausea, denies vomiting, denies chest pain, denies shortness of breath  Blood pressure 121/62, pulse 97, temperature 98.3 F (36.8 C), temperature source Oral, resp. rate 16, SpO2 98 %.There is no weight on file to calculate BMI.  General Appearance: Disheveled  Eye Sport and exercise psychologist::  Fair  Speech:  more verbal today- speaks in whisper- hard to understand at times   Volume:  Decreased  Mood:  Depressed  Affect:  Constricted  Thought Process:  Linear  Orientation:  Full (Time, Place, and Person) she is oriented to month, day of week, year, [place], [facility]- at this time does not appear delirious   Thought Content:  denies hallucinations, no delusions expressed   Suicidal Thoughts:  No today denies any  thoughts of hurting self  Or of SI  Homicidal Thoughts:  No  Memory:  recent and remote fair   Judgement:  Fair  Insight:  Fair  Psychomotor Activity:  Decreased  Concentration:  Fair  Recall:  [employer] of Knowledge:Good  Language: Fair  Akathisia:  Negative  Handed:  Right  AIMS (if indicated):     Assets:   Resilience  ADL's:  Impaired  Cognition: WNL  Sleep:  Number of Hours: 5.75  Assessment - patient presents less drowsy, slightly more interactive and verbal today. She is not delirious , and is 0x3. At this time is not presenting with severe withdrawal symptoms- vitals are stable and is denying any history of DTs or seizures during prior detoxifications. Speech impeded by severe hoarseness, speaks in soft whispers mostly, and is hard to understand at times, but today less so than yesterday. Reports long history of alcohol and cocaine dependencies . Labs unremarkable except for hypoproteinemia .  Treatment Plan Summary: Daily contact with patient to assess and evaluate symptoms and progress in treatment, Medication management, Plan inpatient treatment  and medication management as below 1. Continue Ativan detox protocol to address alcohol withdrawal- PRN rather than standing detox protocol to minimize excessive sedation. 2. Start Zoloft 25 mgrs QDAY initially to address depression 3. Continue one to one observation as patient represents fall risk, safety concerns . 4. Obtain PT consult to assess gait  5. Obtain Dietary consult to assess nutritional status and recommendations  6. Continue to encourage abstinence and recovery efforts . 7. Continue Trazodone 50 mgrs QHS PRN Insomnia , if needed  8. Continue Robaxin PRNs for severe pain , if needed  9. Encourage increased milieu, group participation in order to work on coping skills/ symptom reduction  COBOS, [place] 05/10/2015, 1:13 PM

## 2015-05-10 NOTE — Progress Notes (Signed)
1:1 Nursing Note D: Patient in her room in bed on approach.  Patient appears to be SOB so patient was given a nebulizer treatment.  Patient did get up to the restroom and appears to overexert herself with activity.  Patient has been up to eat ice cream tonight and has been drinking water.   A: Staff to monitor Q 15 mins for safety.  Encouragement and support offered.  Scheduled medications administered per orders.  Neb treatment administered.  Patient remains on 1:1 for fall risk.   R: Patient remains safe on the unit.    SOB resolved.

## 2015-05-10 NOTE — Tx Team (Signed)
Interdisciplinary Treatment Plan Update (Adult) Date: 05/10/2015   Date: 05/10/2015 1:19 PM  Progress in Treatment:  Attending groups: No Participating in groups: No Taking medication as prescribed: Yes  Tolerating medication: Yes  Family/Significant othe contact made: No, CSW attempting to make contact with son. Patient understands diagnosis: Yes Discussing patient identified problems/goals with staff: Yes  Medical problems stabilized or resolved: Yes  Denies suicidal/homicidal ideation: Yes Patient has not harmed self or Others: Yes   New problem(s) identified: None identified at this time.   Discharge Plan or Barriers: Pt plans to discharge to her son's home; CSW assessing for appropriate aftercare  Additional comments: n/a   Reason for Continuation of Hospitalization:  Depression Medication stabilization Withdrawal symptoms  Estimated length of stay: 3-5 days  Review of initial/current patient goals per problem list:   1.  Goal(s): Patient will participate in aftercare plan  Met:  Progressing  Target date: 3-5 days from date of admission   As evidenced by: Patient will participate within aftercare plan AEB aftercare provider and housing plan at discharge being identified.   05/10/15: Pt plans to discharge to her son's house; CSW assessing for appropriate referrals  2.  Goal (s): Patient will exhibit decreased depressive symptoms and suicidal ideations.  Met:  Progressing  Target date: 3-5 days from date of admission   As evidenced by: Patient will utilize self rating of depression at 3 or below and demonstrate decreased signs of depression or be deemed stable for discharge by MD.  05/10/15: Pt reports feeling "kind of" depression. Continuing to assess  4.  Goal(s): Patient will demonstrate decreased signs of withdrawal due to substance abuse  Met:  Yes  Target date: 3-5 days from date of admission   As evidenced by: Patient will produce a CIWA/COWS score of  0, have stable vitals signs, and no symptoms of withdrawal  05/10/15: Pt denies withdrawal symptoms and CIWA score of 0.   Attendees:  Patient:    Family:    Physician: Dr. Parke Poisson, MD  05/10/2015 1:19 PM  Nursing: Lars Pinks, RN Case manager  05/10/2015 1:19 PM  Clinical Social Worker Peri Maris, Latanya Presser, MSW 05/10/2015 1:19 PM  Other: Lucinda Dell, Beverly Sessions [place] 05/10/2015 1:19 PM  Clinical: Grayland Ormond,  RN 05/10/2015 1:19 PM  Other: , RN Charge Nurse 05/10/2015 1:19 PM  Other:     Peri Maris, [place] MSW

## 2015-05-10 NOTE — Progress Notes (Signed)
1:1 Note:  1620  Patient has been laying in bed with eyes closed most of the day.  Has not attended groups.  1:1 with patient in her room for safety.  Patient has been up to bathroom several times today.  Physical therapy worked with patient this afternoon and patient stated she feels very tired.  Patient has been eating small amounts of snack/lunch this afternoon.  No signs/symptoms of pain/distress noted on patient's face/body movements.  1:1 continues for safety per MD orders.

## 2015-05-10 NOTE — Progress Notes (Signed)
D: Patient in her room in bed on approach.  Patient has minimal conversation but does answer questions that Probation officer asks.  Patient states she has eaten a little today and been drinking fluids.  Patient states she still feels she is having some mild anxiety. Patient denies SI/HI and denies AVH.   A: Staff to monitor Q 15 mins for safety.  Encouragement and support offered.  Scheduled medications administered per orders.  Robaxin administered prn for muscle spasms.  Trazodone administered prn for sleep.  Duoneb treatment given prn for SOB.   R: Patient remains safe on the unit.  Patient did not attend group tonight.  Patient visible on the unit and interacting with peers.  Patient taking administered medications.

## 2015-05-10 NOTE — Progress Notes (Signed)
D: Pt is resting in her room with eyes closed.  A: 1:1 staff remains with pt for safety.  R: Pt remains safe on the unit.

## 2015-05-10 NOTE — Evaluation (Signed)
Physical Therapy Evaluation Patient Details Name: Kristen Marsh MRN: [id number] DOB: November 06, 1955 Today's Date: 05/10/2015   History of Present Illness  Pt admitted [place] 2* polysubstance abuse.  Pt states she ambulated with cane prior to admit but distance limited by pain associated with osteoarthritis "all over".  Pt states she would drink to help with the pain  Clinical Impression  Pt admitted as above and presenting with functional mobility limitations 2* c/o arthritic pain, generalized weakness, ambulatory balance deficits and general lethargy.      Follow Up Recommendations No PT follow up;Home health PT (Dependent on progress during [facility] stay)    Equipment Recommendations  Rolling walker with 5" wheels    Recommendations for Other Services       Precautions / Restrictions Precautions Precautions: Fall Restrictions Weight Bearing Restrictions: No      Mobility  Bed Mobility Overal bed mobility: Modified Independent             General bed mobility comments: Pt in/out bed unassisted but with increased time  Transfers Overall transfer level: Needs assistance Equipment used: None Transfers: Sit to/from Stand Sit to Stand: Min assist         General transfer comment: Min assist to bring wt up and to steady on initial standing  Ambulation/Gait Ambulation/Gait assistance: Min assist;Mod assist Ambulation Distance (Feet): 60 Feet Assistive device: Rolling walker (2 wheeled);Straight cane;1 person hand held assist Gait Pattern/deviations: Step-through pattern;Decreased step length - right;Decreased step length - left;Shuffle;Staggering right;Trunk flexed Gait velocity: decr Gait velocity interpretation: Below normal speed for age/gender General Gait Details: Shuffling step, staggering gait, balance loss with HHA or cane.  Marked improvement in stability with use of RW - cues for posture and position from [employer] Mobility    Modified  [place] (Stroke Patients Only)       Balance Overall balance assessment: Needs assistance Sitting-balance support: Feet supported Sitting balance-Leahy Scale: Fair     Standing balance support: No upper extremity supported Standing balance-Leahy Scale: Fair                               Pertinent Vitals/Pain Pain Assessment: Faces Faces Pain Scale: Hurts even more Pain Location: Bil LEs Pain Descriptors / Indicators: Aching Pain Intervention(s): Limited activity within patient's tolerance;Monitored during session    Home Living Family/patient expects to be discharged to:: Private residence                      Prior Function Level of Independence: Independent with assistive device(s)               Hand Dominance        Extremity/Trunk Assessment   Upper Extremity Assessment: Generalized weakness           Lower Extremity Assessment: Generalized weakness         Communication   Communication: No difficulties  Cognition Arousal/Alertness: Lethargic;Suspect due to medications Behavior During Therapy: Flat affect Overall Cognitive Status: Within Functional Limits for tasks assessed                      General Comments      Exercises        Assessment/Plan    PT Assessment Patient needs continued PT services  PT Diagnosis Difficulty walking   PT Problem List Decreased strength;Decreased  activity tolerance;Decreased balance;Decreased mobility;Decreased knowledge of use of DME;Pain;Decreased safety awareness  PT Treatment Interventions DME instruction;Gait training;Functional mobility training;Therapeutic activities;Therapeutic exercise;Patient/family education   PT Goals (Current goals can be found in the Care Plan section) Acute Rehab PT Goals Patient Stated Goal: Hurt less PT Goal Formulation: With patient Time For Goal Achievement: 05/24/15 Potential to Achieve Goals: Good    Frequency Min 2X/week   Barriers  to discharge        Co-evaluation               End of Session Equipment Utilized During Treatment: Gait belt Activity Tolerance: Patient limited by fatigue;Patient limited by pain Patient left: in bed;with call bell/phone within reach;with nursing/sitter in room Nurse Communication: Mobility status    Functional Assessment Tool Used: Clinical judgement Functional Limitation: Mobility: Walking and moving around Mobility: Walking and Moving Around Current Status (L9357): At least 20 percent but less than 40 percent impaired, limited or restricted Mobility: Walking and Moving Around Goal Status [phone]): At least 1 percent but less than 20 percent impaired, limited or restricted    Time: 1520-1535 PT Time Calculation (min) (ACUTE ONLY): 15 min   Charges:   PT Evaluation $Initial PT Evaluation Tier I: 1 Procedure     PT G Codes:   PT G-Codes **NOT FOR INPATIENT CLASS** Functional Assessment Tool Used: Clinical judgement Functional Limitation: Mobility: Walking and moving around Mobility: Walking and Moving Around Current Status (T9030): At least 20 percent but less than 40 percent impaired, limited or restricted Mobility: Walking and Moving Around Goal Status [phone]): At least 1 percent but less than 20 percent impaired, limited or restricted    Montine Hight 05/10/2015, 5:50 PM

## 2015-05-11 DIAGNOSIS — K7460 Unspecified cirrhosis of liver: Secondary | ICD-10-CM

## 2015-05-11 DIAGNOSIS — K219 Gastro-esophageal reflux disease without esophagitis: Secondary | ICD-10-CM

## 2015-05-11 MED ORDER — PANTOPRAZOLE SODIUM 40 MG PO TBEC
40.0000 mg | DELAYED_RELEASE_TABLET | Freq: Every day | ORAL | Status: DC
  Administered 2015-05-12 – 2015-05-18 (×7): 40 mg via ORAL
  Filled 2015-05-11 (×8): qty 1

## 2015-05-11 MED ORDER — DICYCLOMINE HCL 20 MG PO TABS
20.0000 mg | ORAL_TABLET | Freq: Three times a day (TID) | ORAL | Status: DC | PRN
  Administered 2015-05-15: 20 mg via ORAL
  Filled 2015-05-11: qty 1

## 2015-05-11 MED ORDER — SERTRALINE HCL 50 MG PO TABS
50.0000 mg | ORAL_TABLET | Freq: Every day | ORAL | Status: DC
  Administered 2015-05-12 – 2015-05-14 (×3): 50 mg via ORAL
  Filled 2015-05-11 (×5): qty 1

## 2015-05-11 MED ORDER — CETIRIZINE HCL 10 MG PO TABS
10.0000 mg | ORAL_TABLET | Freq: Every day | ORAL | Status: DC
  Administered 2015-05-12 – 2015-05-18 (×7): 10 mg via ORAL
  Filled 2015-05-11 (×8): qty 1

## 2015-05-11 MED ORDER — CLOTRIMAZOLE-BETAMETHASONE 1-0.05 % EX CREA: TOPICAL_CREAM | Freq: Two times a day (BID) | CUTANEOUS | Status: DC

## 2015-05-11 MED ORDER — DICYCLOMINE HCL 20 MG PO TABS: 20.0000 mg | ORAL_TABLET | Freq: Three times a day (TID) | ORAL | Status: DC

## 2015-05-11 MED ORDER — CLOTRIMAZOLE 1 % EX CREA
TOPICAL_CREAM | Freq: Two times a day (BID) | CUTANEOUS | Status: DC
  Administered 2015-05-11: 1 via TOPICAL
  Administered 2015-05-12: 16:00:00 via TOPICAL
  Administered 2015-05-12: 1 via TOPICAL
  Administered 2015-05-13: 08:00:00 via TOPICAL
  Administered 2015-05-13: 1 via TOPICAL
  Administered 2015-05-14 – 2015-05-18 (×7): via TOPICAL
  Filled 2015-05-11 (×2): qty 15

## 2015-05-11 MED ORDER — LACTULOSE 10 GM/15ML PO SOLN
10.0000 g | Freq: Two times a day (BID) | ORAL | Status: DC
  Administered 2015-05-11 – 2015-05-12 (×2): 10 g via ORAL
  Filled 2015-05-11 (×10): qty 15

## 2015-05-11 MED ORDER — GABAPENTIN 100 MG PO CAPS
100.0000 mg | ORAL_CAPSULE | Freq: Two times a day (BID) | ORAL | Status: DC
  Administered 2015-05-11 – 2015-05-14 (×7): 100 mg via ORAL
  Filled 2015-05-11 (×10): qty 1

## 2015-05-11 MED ORDER — BETAMETHASONE VALERATE 0.1 % EX LOTN
TOPICAL_LOTION | Freq: Two times a day (BID) | CUTANEOUS | Status: DC
  Administered 2015-05-11 – 2015-05-12 (×2): 1 via TOPICAL
  Administered 2015-05-12 – 2015-05-13 (×2): via TOPICAL
  Administered 2015-05-13: 1 via TOPICAL
  Administered 2015-05-14 – 2015-05-18 (×6): via TOPICAL
  Filled 2015-05-11 (×2): qty 60

## 2015-05-11 MED ORDER — OXYCODONE HCL 5 MG PO TABS
5.0000 mg | ORAL_TABLET | Freq: Four times a day (QID) | ORAL | Status: DC | PRN
  Administered 2015-05-11 – 2015-05-12 (×4): 5 mg via ORAL
  Filled 2015-05-11 (×4): qty 1

## 2015-05-11 NOTE — Progress Notes (Signed)
D: Patient is resting in the bed with eyes open. Respirations are even and unlabored. Patient appears to be in no distress. A: Staff monitors patient Q 15 mins for safety. Patient remains on 1:1 for fall risk safety. R:Patient remains safe on the unit

## 2015-05-11 NOTE — [facility] Group Notes (Signed)
[facility] LCSW Aftercare Discharge Planning Group Note  05/11/2015 8:45 AM  Pt did not attend, declined invitation.   Peri Maris, [place] 05/11/2015 9:17 AM

## 2015-05-11 NOTE — Progress Notes (Signed)
D . Pt in bed on approach, appears very weak, frequent coughing, wheezing.  Pt requested and received inhaler.  Pt did not feel well enough to attend evening wrap up group.  Pt is presently on 1:1 for safety.  Pt denies SI/HI/hallucinations at this time.  A.  Support and encouragement offered, medication given as ordered  R.  Pt remains safe on unit, will continue to monitor.  See 1:1 notes on flow sheet at bedside.

## 2015-05-11 NOTE — Progress Notes (Signed)
Nursing 1:1 Note D: Patient resting in bed with eyes closed.  Respirations even and unlabored.  Patient appears to be in no apparent distress. A: Staff to monitor Q 15 mins for safety.  Patient remains on 1:1 for fall risk safety. R:Patient remains safe on the unit.

## 2015-05-11 NOTE — Consult Note (Signed)
PCP:   Evelina Dun, FNP   Chief Complaint:  Generalized body aches  HPI:  59 year old female who  has a past medical history of Osteoarthritis; Hep C w/o coma, chronic ([place]); Emphysema; GERD (gastroesophageal reflux disease); IBS (irritable bowel syndrome); Psoriasis; MRSA carrier; Fibromyalgia; Shortness of breath; Hypertension; Pneumonia; Anxiety; Kidney stones; Depression; Cirrhosis of liver ([place]); and Varicose vein. Patient was admitted to [facility] for alcohol  and cocaine abuse, and has been comparing of generalized body aches. Patient recently had surgery on the larynx and hoarseness of voice. As per patient she was drinking alcohol at home and her son dropped her at [facility], patient was sent to [facility] for suicidal ideation. Today patient complains of generalized body aches with burning sensation in the lower extremities. She also complains of rash under the right elbow, under left breast and groin which has been present for almost 6 months. As per patient she has a history of psoriasis. She was also diagnosed with liver cirrhosis, she has a history of hepatitis C managed by GI Dr Corbin Ade  in [place]. She has been taking lactulose and Lasix at home. She denies chest pain, no shortness of breath. No nausea vomiting or diarrhea. Denies anorexia. Denies tremor, no anxiety.  Allergies:   Allergies  Allergen Reactions  . Codeine Itching  . Hydrocodone-Acetaminophen Itching  . Meloxicam Swelling    Face swelling  . Montelukast Cough      Past Medical History  Diagnosis Date  . Osteoarthritis   . Hep C w/o coma, chronic ([place])   . Emphysema   . GERD (gastroesophageal reflux disease)   . IBS (irritable bowel syndrome)   . 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 MRSA carrier   . Fibromyalgia   . Shortness of breath     with exertion  . Hypertension   . Pneumonia     As a child  . Anxiety   . Kidney stones   . Depression   . Cirrhosis of liver ([place])   . Varicose vein     Of esophagus  and top of stomach per pt    Past Surgical History  Procedure Laterality Date  . Hysteroscopy    . Appendectomy    . Abdominal hysterectomy    . Diagnostic laparoscopy      exploratory lap  . Colonoscopy w/ biopsies and polypectomy    . Direct laryngoscopy N/A 03/22/2014    Procedure: DIRECT LARYNGOSCOPY;  Surgeon: Ruby Cola, MD;  Location: [place];  Service: ENT;  Laterality: N/A;  With biopsy  . Esophagogastroduodenoscopy (egd) with propofol N/A 07/26/2014    Procedure: ESOPHAGOGASTRODUODENOSCOPY (EGD) WITH PROPOFOL;  Surgeon: Rogene Houston, MD;  Location: [facility];  Service: Endoscopy;  Laterality: N/A;  . Direct laryngoscopy N/A 01/15/2015    Procedure: DIRECT LARYNGOSCOPY WITH BIOPSY;  Surgeon: Leta Baptist, MD;  Location: [place];  Service: ENT;  Laterality: N/A;    Prior to Admission medications   Medication Sig Start Date End Date Taking? Authorizing Provider  albuterol (PROVENTIL HFA) 108 (90 BASE) MCG/ACT inhaler INHALE 2 PUFFS INTO THE LUNGS EVERY FOUR HOURS AS NEEDED FOR WHEEZING. 01/12/15   Sharion Balloon, FNP  albuterol (PROVENTIL) (2.5 MG/3ML) 0.083% nebulizer solution Take 3 mLs (2.5 mg total) by nebulization every 6 (six) hours as needed for wheezing or shortness of breath. 11/27/14   Mary-Margaret Hassell Done, FNP  Cetirizine HCl (ZYRTEC ALLERGY PO) Take 1 tablet by mouth daily.  Historical Provider, MD  dicyclomine (BENTYL) 20 MG tablet Take 1 tablet (20 mg total) by mouth every 6 (six) hours. 10/11/14   Sharion Balloon, FNP  furosemide (LASIX) 40 MG tablet TAKE 1 TABLET BY MOUTH ONCE DAILY. 04/03/15   Sharion Balloon, FNP  lactulose (CHRONULAC) 10 GM/15ML solution TAKE 15MLs BY MOUTH TWICE DAILY. 04/03/15   Sharion Balloon, FNP  omeprazole (PRILOSEC) 20 MG capsule Take 20 mg by mouth 2 (two) times daily.    Historical Provider, MD    Social History:  reports that she has been smoking Cigarettes.  She has a 20 pack-year smoking history. She has never used  smokeless tobacco. She reports that she drinks about 2.4 oz of alcohol per week. She reports that she uses illicit drugs (Cocaine and "Crack" cocaine).  Family History  Problem Relation Age of Onset  . Cancer Mother     esophageal  . Cancer Brother     Liver cancer and cirrhosis age 61    Filed Weights    All the positives are listed in BOLD  Review of Systems:  HEENT: Headache, blurred vision, runny nose, sore throat Neck: Hypothyroidism, hyperthyroidism,,lymphadenopathy Chest : Shortness of breath, history of COPD, Asthma Heart : Chest pain, history of coronary arterey disease GI:  Nausea, vomiting, diarrhea, constipation, GERD GU: Dysuria, urgency, frequency of urination, hematuria Neuro: Stroke, seizures, syncope Psych: Depression, anxiety, hallucinations   Physical Exam: Blood pressure 111/61, pulse 81, temperature 98.4 F (36.9 C), temperature source Oral, resp. rate 16, SpO2 98 %. Constitutional:   Patient is a malnourished appearing female in no acute distress and cooperative with exam. Head: Normocephalic and atraumatic Mouth: Mucus membranes moist Eyes: PERRL, EOMI, conjunctivae normal Neck: Supple, No Thyromegaly Cardiovascular: RRR, S1 normal, S2 normal Pulmonary/Chest: CTAB, no wheezes, rales, or rhonchi Abdominal: Soft. Non-tender, non-distended, bowel sounds are normal, no masses, organomegaly, or guarding present.  Neurological: A&O x3, Strength is normal and symmetric bilaterally, cranial nerve II-XII are grossly intact, no focal motor deficit, sensory intact to light touch bilaterally.  Extremities : No Cyanosis, Clubbing or Edema Skin- erythematous scaly lesions noted on the right elbow, under left breast and groin area.  Labs on Admission:  Basic Metabolic Panel:  Recent Labs Lab 05/06/15 1552 05/09/15 0649  NA 138 140  K 3.0* 4.2  CL 106 111  CO2 24 25  GLUCOSE 85 90  BUN 6 10  CREATININE 0.58 0.50  CALCIUM 7.6* 8.1*   Liver Function  Tests:  Recent Labs Lab 05/06/15 1552 05/10/15 0635  AST 93* 61*  ALT 39 41  ALKPHOS 134* 118  BILITOT 2.5* 2.0*  PROT 6.8 6.5  ALBUMIN 3.0* 2.7*    CBC:  Recent Labs Lab 05/06/15 1552  WBC 4.9  NEUTROABS 3.8  HGB 10.9*  HCT 33.1*  MCV 88.3  PLT 63*   Cardiac Enzymes:  Recent Labs Lab 05/06/15 1607  TROPONINI <0.03    BNP (last 3 results)  Recent Labs  05/06/15 1607 05/09/15 0649  BNP 174.0* 62.2     EKG: Independently reviewed. EKG on 05/07/2015 showed sinus tachycardia   Assessment/Plan Active Problems:   Cirrhosis of liver (HCC)   Degenerative cervical disc   GERD (gastroesophageal reflux disease)   Alcoholism /alcohol abuse (HCC)   IBS (irritable bowel syndrome)   Polysubstance abuse  Generalized body aches Appears multifactorial at this time, she has a history of liver cirrhosis, hepatitis C, polysubstance abuse, alcohol abuse. Would continue with the detox protocol.  Will start Oxycodone 5 mg po Q 6 hr prn for pain. Patient also has been started on Robaxin 500 mg every 8 hours when necessary for muscle spasm, will also restart Bentyl which patient was taking at home 20 mg by mouth 3 times a day when necessary.  Rash Likely exacerbation of psoriasis versus tinea. We'll start the patient on Lotrisone cream twice a day.   History of liver cirrhosis Liver enzymes AST 61, ALT 41, total bili 2.0. Will check ammonia level, INR. Patient does not appear to be in fluid overload, no ascites. Will hold Lasix at this time. Will restart lactulose 10 g twice a day as patient was taking at home.  Alcohol abuse Continue thiamine, Ativan as per CIWA protocol  Peripheral neuropathy Patient complains of burning sensation in both legs and feet, likely peripheral neuropathy from multifactorial causes including alcohol abuse, liver cirrhosis and hepatitis C. She has been started on gabapentin 100 mg by mouth twice daily. Will continue with gabapentin,  also added  when necessary oxycodone As above.  Thrombocytopenia, chronic Patient has platelet count of 63, likely from liver cirrhosis.   Will follow the patient along with you  Time Spent on Admission: 60 min  [facility] Pager: [phone] 05/11/2015, 4:22 PM  If 7PM-7AM, please contact night-coverage  [email or web address]  Password [email or web address]

## 2015-05-11 NOTE — Progress Notes (Signed)
Pt did not attend wrap up group this evening.  

## 2015-05-11 NOTE — Progress Notes (Signed)
D: Patient reported sleeping good last night. Patient has a fair appetite with a low energy level. The patient states their concentration is poor. She rates her depression at a 5, her hopelessness at a 5, and states her anxiety is full blown. She states she is dizzy when standing up and uses a walker when ambulating to the bathroom. Patient denies SI/HI/AVH. She states she has pain over her whole body at a 10. Her goal today was to get a shower. A: Medication was given and explained. Emotional support was provided. Encouragement to complete ADLs was verbalized to patient. R: Patient was compliant with medication. She verbalized she would complete ADLs. Patient was overall cooperative.

## 2015-05-11 NOTE — Progress Notes (Signed)
Recreation Therapy Notes  Date: 10.07.2016 Time: 9:30am Location: [address] Group room   Group Topic: Stress Management  Goal Area(s) Addresses:  Patient will actively participate in stress management techniques presented during session.   Behavioral Response: Did not attend.   Selene Peltzer L Faruq Rosenberger, LRT/CTRS        Prakriti Carignan L 05/11/2015 10:03 AM 

## 2015-05-11 NOTE — Progress Notes (Signed)
Nursing 1:1 Note: D: Patient awake in bed at this time.  Writer assisted patient to the restroom. Patient has dyspnea with exertion but it subsided when patient got back in bed.  Patient asked for ice cream and Ice cream given.   A: Staff to monitor Q 15 mins for safety.  Encouragement and support offered.  Albuterol inhaler administered. R: Patient remains safe on the unit.  Patient resting in bed.

## 2015-05-11 NOTE — Progress Notes (Signed)
NUTRITION NOTE  Pt seen for consult for hypoproteinemia, hx of liver cirrhosis with need for nutrition education and assessment. Pt was sleeping at time of RD visit. RN note from 1008 states: Patient has a fair appetite with a low energy level. The patient states their concentration is poor.  1:1 sitter present in pt's room at time of RD visit. Sitter states that pt has been sleeping all morning except for when she gets up to go to the bathroom. Sitter attempted name call to arouse pt but pt did not wake up. Informed sitter of consult to RD informed her should pt be more alert another time to have RN or MD re-consult so that education can be provided to pt in-person.   Provided handouts to sitter to place in folder for pt. These handouts are "Cirrhosis Nutrition Therapy" and "Protein Content of Foods" from the Academy of Nutrition and Dietetics. Wrote on both handouts that pt should aim for 75 grams protein/day consistent with ~1.2 grams protein/kg body weight.    Jarome Matin, RD, LDN Inpatient Clinical Dietitian Pager # [phone] After hours/weekend pager # [phone]

## 2015-05-11 NOTE — BHH Counselor (Signed)
CSW attempted to contact Pt's son Kristen Marsh [phone]) to discuss discharge planning and obtain collateral information. CSW left voicemail requesting call back. Will complete PSA via chart information   Peri Maris, Owen Work [phone]

## 2015-05-11 NOTE — BHH Counselor (Signed)
Adult Comprehensive Assessment  Patient ID: Kristen Marsh, female   DOB: February 18, 1956, 59 y.o.   MRN: [id number]  Information Source: Information source: Patient (and collateral from Kristen Marsh with Pt's consent)  Current Stressors:  Educational / Learning stressors: Unknown Employment / Job issues: Pt is on disability  Family Relationships: Chief Executive Officer / Lack of resources (include bankruptcy): Pt is on disability; limited income Housing / Lack of housing: Pt's stepdaughter is moving; hopes to move in with son Physical health (include injuries & life threatening diseases): Arthritis; pain "all over" Social relationships: Unknown Substance abuse: Pt reports daily use of alcohol, crack, and Adderall Bereavement / Loss: Unknown  Living/Environment/Situation:  Living Arrangements: Other relatives (with step-daugther but is hoping to live with son) Living conditions (as described by patient or guardian): "okay but she is moving" How long has patient lived in current situation?: 2 years What is atmosphere in current home: Temporary  Family History:  Marital status: Single Does patient have children?: Yes How is patient's relationship with their children?: Kristen Marsh, son, is supportive  Childhood History:  By whom was/is the patient raised?: Other (Comment) (Unknown) Description of patient's relationship with caregiver when they were a child: Unknown Patient's description of current relationship with people who raised him/her: Unknown Does patient have siblings?:  (Unknown) Did patient suffer any verbal/emotional/physical/sexual abuse as a child?:  (Unknown) Did patient suffer from severe childhood neglect?:  (Unknown) Has patient ever been sexually abused/assaulted/raped as an adolescent or adult?:  (Unknown) Was the patient ever a victim of a crime or a disaster?:  (Unknown) Witnessed domestic violence?:  (Unknown) Has patient been effected by domestic violence as an adult?:   (Unknown)  Education:  Highest grade of school patient has completed: Unknown Currently a Ship broker?: No Learning disability?: No  Employment/Work Situation:   Employment situation: On disability Why is patient on disability: Unknown How long has patient been on disability: Unknown Patient's job has been impacted by current illness:  (N/A) What is the longest time patient has a held a job?: Unknown Where was the patient employed at that time?: Unknown Has patient ever been in the [employer]?: No Has patient ever served in Recruitment consultant?: No  Financial Resources:   Museum/gallery curator resources: Teacher, early years/pre Does patient have a Programmer, applications or guardian?: No  Alcohol/Substance Abuse:   What has been your use of drugs/alcohol within the last 12 months?: 3 "tall" Smirnoff's daily; abuses Adderall and crack cocaine If attempted suicide, did drugs/alcohol play a role in this?: No Alcohol/Substance Abuse Treatment Hx: Past Tx, Inpatient, Past detox Has alcohol/substance abuse ever caused legal problems?:  (Unknown)  Social Support System:   Patient's Community Support System:  (Unknown) Describe Community Support System: Pt's son Type of faith/religion: Unknown How does patient's faith help to cope with current illness?: Unknown  Leisure/Recreation:   Leisure and Hobbies: Unknown  Strengths/Needs:   What things does the patient do well?: Unknown In what areas does patient struggle / problems for patient: Unknown  Discharge Plan:   Does patient have access to transportation?: No Plan for no access to transportation at discharge: family Will patient be returning to same living situation after discharge?: No Plan for living situation after discharge: plans to go live with her son Currently receiving community mental health services:  (Unknown) If no, would patient like referral for services when discharged?:  (Unknown) Does patient have financial barriers related to discharge medications?:  No  Summary/Recommendations:     Patient is a 59 year old  Caucasian female with a diagnosis of Alcohol Use Disorder, severe; Stimulant Use Disorder, severe. Pt has multiple health conditions and describes severe pain "all over." This assessment was completed partially by the patient and partially by chart information as Pt is very difficult to understand. CSW continues to assess for appropriate discharge plan.  Pt gave verbal consent to contact her son, Kristen Marsh. Patient will benefit from crisis stabilization, medication evaluation, group therapy and psycho education in addition to case management for discharge planning.     Kristen Marsh. 05/11/2015

## 2015-05-11 NOTE — B[facility] Group Notes (Signed)
[place] LCSW Group Therapy 05/11/2015 1:15pm  Type of Therapy: Group Therapy- Feelings Around Relapse and Recovery  Pt did not attend, declined invitation.   Peri Maris, Latanya Presser [phone] 05/11/2015 4:40 PM

## 2015-05-11 NOTE — Progress Notes (Signed)
Patient ID: Kristen Marsh, female   DOB: 13-Dec-1955, 59 y.o.   MRN: [id number] [facility] MD Progress Note  05/11/2015 3:05 PM Kristen Marsh  MRN:  [id number] Subjective:    She reports " pain all over ". States it is worse on lower extremities and describes " burning " sensations of feet /legs.  Objective : I have discussed case with treatment team and have met with patient. Patient  Is improving  Gradually. Still in bed most of the time, but improved compared to admission, up to go to the bathroom , and using walker to mobilize for short distances. She is a poor historian and it is difficult to ascertain how long she has had symptoms such as compromised ambulation, chronic pain, poor PO intake, etc  She makes statements such as " for a while now". " long time ". Does not endorse any of these symptoms as  Being acute or new onset . It is difficult to determine at this time what her baseline functional level is. As discussed with team/ CSW, efforts have been made to contact family/ son for collateral information, without success . Patient presents with fair eye contact, in bed most of the time, but generally cooperative with staff, focuses on pain, described as above . Does not appear to be in acute distress or discomfort, however. Is not currently presenting with significant WDL symptoms- no tremors, no diaphoresis, no psychomotor agitation. At this time does not endorse medication side effects..  Of note, she is on one to one observation for safety- fall risk.   Principal Problem: Alcohol Dependence, cocaine Dependence , Substance Induced Mood Disorder- Depressed  Diagnosis:   Patient Active Problem List   Diagnosis Date Noted  . Polysubstance abuse [F19.10] 05/07/2015  . Ascites due to alcoholic cirrhosis ([place]) [N23.55] 02/28/2015  . Ascites [R18.8] 02/28/2015  . GAD (generalized anxiety disorder) [F41.1] 10/11/2014  . IBS (irritable bowel syndrome) [K58.9] 10/11/2014  . COPD bronchitis [J44.9]  10/15/2013  . Alcoholism /alcohol abuse ([place]) [F10.20] 10/15/2013  . Cirrhosis of liver ([place]) [K74.60] 08/18/2013  . Degenerative cervical disc [M50.30] 08/18/2013  . Fibromyalgia [M79.7] 08/18/2013  . Muscle cramps [R25.2] 08/18/2013  . GERD (gastroesophageal reflux disease) [K21.9] 08/18/2013   Total Time spent with patient: 25 minutes    Past Medical History:  Past Medical History  Diagnosis Date  . Osteoarthritis   . Hep C w/o coma, chronic ([place])   . Emphysema   . GERD (gastroesophageal reflux disease)   . IBS (irritable bowel syndrome)   . 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 MRSA carrier   . Fibromyalgia   . Shortness of breath     with exertion  . Hypertension   . Pneumonia     As a child  . Anxiety   . Kidney stones   . Depression   . Cirrhosis of liver ([place])   . Varicose vein     Of esophagus and top of stomach per pt    Past Surgical History  Procedure Laterality Date  . Hysteroscopy    . Appendectomy    . Abdominal hysterectomy    . Diagnostic laparoscopy      exploratory lap  . Colonoscopy w/ biopsies and polypectomy    . Direct laryngoscopy N/A 03/22/2014    Procedure: DIRECT LARYNGOSCOPY;  Surgeon: Ruby Cola, MD;  Location: [place];  Service: ENT;  Laterality: N/A;  With biopsy  . Esophagogastroduodenoscopy (egd) with propofol N/A 07/26/2014  Procedure: ESOPHAGOGASTRODUODENOSCOPY (EGD) WITH PROPOFOL;  Surgeon: Rogene Houston, MD;  Location: [facility];  Service: Endoscopy;  Laterality: N/A;  . Direct laryngoscopy N/A 01/15/2015    Procedure: DIRECT LARYNGOSCOPY WITH BIOPSY;  Surgeon: Leta Baptist, MD;  Location: [place];  Service: ENT;  Laterality: N/A;   Family History:  Family History  Problem Relation Age of Onset  . Cancer Mother     esophageal  . Cancer Brother     Liver cancer and cirrhosis age 67   Social History:  History  Alcohol Use  . 2.4 oz/week  . 4 Cans of beer per week    Comment: 1 smirnoff a day.      History   Drug Use  . Yes  . Special: Cocaine, "Crack" cocaine    Comment: Haven't used cocaine in the last 3 years    Social History   Social History  . Marital Status: Divorced    Spouse Name: N/A  . Number of Children: N/A  . Years of Education: N/A   Social History Main Topics  . Smoking status: Current Every Day Smoker -- 0.50 packs/day for 40 years    Types: Cigarettes  . Smokeless tobacco: Never Used     Comment: 1/2 pack a day at least 40 yrs  . Alcohol Use: 2.4 oz/week    4 Cans of beer per week     Comment: 1 smirnoff a day.   . Drug Use: Yes    Special: Cocaine, "Crack" cocaine     Comment: Haven't used cocaine in the last 3 years  . Sexual Activity: No   Other Topics Concern  . None   Social History Narrative   Additional Social History:   Sleep:  Improving   Appetite:   Has been poor but as per staff has eaten more today, ate most of breakfast, although very slowly.   Current Medications: Current Facility-Administered Medications  Medication Dose Route Frequency Provider Last Rate Last Dose  . albuterol (PROVENTIL HFA;VENTOLIN HFA) 108 (90 BASE) MCG/ACT inhaler 2 puff  2 puff Inhalation Q6H PRN Niel Hummer, NP   2 puff at 05/11/15 [phone]  . alum & mag hydroxide-simeth (MAALOX/MYLANTA) 200-200-20 MG/5ML suspension 30 mL  30 mL Oral Q4H PRN Niel Hummer, NP      . ipratropium-albuterol (DUONEB) 0.5-2.5 (3) MG/3ML nebulizer solution 3 mL  3 mL Nebulization Q6H PRN Laverle Hobby, PA-C   3 mL at 05/10/15 2307  . LORazepam (ATIVAN) tablet 1 mg  1 mg Oral Q6H PRN Kristen Peer Kristen Nave, MD      . magnesium hydroxide (MILK OF MAGNESIA) suspension 30 mL  30 mL Oral Daily PRN Niel Hummer, NP      . methocarbamol (ROBAXIN) tablet 500 mg  500 mg Oral Q8H PRN Niel Hummer, NP   500 mg at 05/11/15 1034  . multivitamin with minerals tablet 1 tablet  1 tablet Oral Daily Niel Hummer, NP   1 tablet at 05/11/15 0741  . sertraline (ZOLOFT) tablet 25 mg  25 mg Oral Daily Kristen Campus, MD   25 mg at 05/11/15 0741  . thiamine (VITAMIN B-1) tablet 100 mg  100 mg Oral Daily Niel Hummer, NP   100 mg at 05/11/15 0741  . traZODone (DESYREL) tablet 50 mg  50 mg Oral QHS PRN Niel Hummer, NP   50 mg at 05/10/15 2201    Lab Results:  Results for orders  placed or performed during the hospital encounter of 05/07/15 (from the past 48 hour(s))  Hepatic function panel     Status: Abnormal   Collection Time: 05/10/15  6:35 AM  Result Value Ref Range   Total Protein 6.5 6.5 - 8.1 g/dL   Albumin 2.7 (L) 3.5 - 5.0 g/dL   AST 61 (H) 15 - 41 U/L   ALT 41 14 - 54 U/L   Alkaline Phosphatase 118 38 - 126 U/L   Total Bilirubin 2.0 (H) 0.3 - 1.2 mg/dL   Bilirubin, Direct 0.7 (H) 0.1 - 0.5 mg/dL   Indirect Bilirubin 1.3 (H) 0.3 - 0.9 mg/dL    Comment: Performed at [facility]    Physical Findings: AIMS: Facial and Oral Movements Muscles of Facial Expression: None, normal Lips and Perioral Area: None, normal Jaw: None, normal Tongue: None, normal,Extremity Movements Upper (arms, wrists, hands, fingers): Minimal Lower (legs, knees, ankles, toes): Minimal, Trunk Movements Neck, shoulders, hips: Minimal, Overall Severity Severity of abnormal movements (highest score from questions above): Minimal Incapacitation due to abnormal movements: Minimal Patient's awareness of abnormal movements (rate only patient's report): Aware, no distress, Dental Status Current problems with teeth and/or dentures?: Yes Does patient usually wear dentures?: No  CIWA:  CIWA-Ar Total: 1 COWS:  COWS Total Score: 2  Musculoskeletal: Strength & Muscle Tone: within normal limits Gait & Station: unsteady Patient leans: N/A  Psychiatric Specialty Exam: ROS- describes joint, knee pains, denies nausea, denies vomiting, denies chest pain, denies shortness of breath  Blood pressure 111/61, pulse 81, temperature 98.4 F (36.9 C), temperature source Oral, resp. rate 16, SpO2 98 %.There is  no weight on file to calculate BMI.  General Appearance: Disheveled  Eye Sport and exercise psychologist::  Fair  Speech:  more verbal today- speaks in whisper- hard to understand at times   Volume:  Decreased  Mood:  Depressed  Affect:  Constricted  Thought Process:  Linear  Orientation:  Full (Time, Place, and Person) she  Remains oriented to month, day of week, year, [place], [facility]- at this time does not appear delirious   Thought Content:  denies hallucinations, no delusions expressed   Suicidal Thoughts:  No today denies any thoughts of hurting self  Or of SI  Homicidal Thoughts:  No  Memory:  recent and remote fair   Judgement:  Fair  Insight:  Fair  Psychomotor Activity:  Decreased  Concentration:  Fair  Recall:  [employer] of Knowledge:Good  Language: Fair  Akathisia:  Negative  Handed:  Right  AIMS (if indicated):     Assets:  Resilience  ADL's:  Impaired  Cognition: WNL  Sleep:  Number of Hours: 5.75  Assessment - patient presents as chronically ill, undernourished, in bed most of the time, but gradually becoming more amenable to get out of bed for short periods of time with staff encouragement . She is not delirious or grossly confused, and is 0x3. Speech difficulties make communication more challenging, but seems able to make her needs known. Due to patient being poor historian and staff not having been able to establish contact with family at this time, it is difficult to establish what patient's baseline status, level of functioning is . Pain and depression are reported as patient's major ongoing issues. She is not suicidal and appetite may be gradually improving. Pain described as diffuse, but mostly a burning sensation on legs suggestive of peripheral neuropathy.   Treatment Plan Summary: Daily contact with patient to assess and  evaluate symptoms and progress in treatment, Medication management, Plan inpatient treatment  and medication management as below 1. Continue  Ativan detox protocol to address alcohol withdrawal- PRN rather than standing detox protocol to minimize excessive sedation. 2. Increase  Zoloft  To 50  mgrs QDAY initially to address depression 3. Continue one to one observation as patient represents fall risk, safety concerns . 4. Start  Neurontin 100 mgr BID to address neuropathic pain 5. CSW/ treatment team making efforts to establish contact with family for collateral information and disposition planning efforts  6. Continue to encourage abstinence and recovery efforts . 7. Continue Trazodone 50 mgrs QHS PRN Insomnia , if needed  8. Continue Robaxin PRNs for severe pain , if needed  9. Encourage increased milieu, group participation in order to work on coping skills/ symptom reduction  Kristen Marsh, [place] 05/11/2015, 3:05 PM

## 2015-05-11 NOTE — Progress Notes (Signed)
D: Patient is resting in the bed with eyes closed. Respirations are even and unlabored. Patient appears to be in no distress. A: Staff monitors patient Q 15 mins for safety. Patient remains on 1:1 for fall risk safety. R:Patient remains safe on the unit

## 2015-05-11 NOTE — Progress Notes (Signed)
D: Patient is resting in the bed with eyes closed. Respirations are even and unlabored. Patient appears to be in no distress. A: Staff monitors patient Q 15 mins for safety. Patient remains on 1:1 for fall risk safety. R:Patient remains safe on the unit.

## 2015-05-12 DIAGNOSIS — F1420 Cocaine dependence, uncomplicated: Secondary | ICD-10-CM

## 2015-05-12 DIAGNOSIS — F1020 Alcohol dependence, uncomplicated: Secondary | ICD-10-CM

## 2015-05-12 DIAGNOSIS — F1994 Other psychoactive substance use, unspecified with psychoactive substance-induced mood disorder: Secondary | ICD-10-CM

## 2015-05-12 LAB — COMPREHENSIVE METABOLIC PANEL
ALBUMIN: 2.7 g/dL — AB (ref 3.5–5.0)
ALK PHOS: 107 U/L (ref 38–126)
ALT: 35 U/L (ref 14–54)
AST: 52 U/L — AB (ref 15–41)
Anion gap: 5 (ref 5–15)
BUN: 11 mg/dL (ref 6–20)
CALCIUM: 8.1 mg/dL — AB (ref 8.9–10.3)
CO2: 27 mmol/L (ref 22–32)
CREATININE: 0.47 mg/dL (ref 0.44–1.00)
Chloride: 104 mmol/L (ref 101–111)
GFR calc Af Amer: >60 mL/min (ref >60)
GFR calc non Af Amer: >60 mL/min (ref >60)
GLUCOSE: 91 mg/dL (ref 65–99)
Potassium: 4.1 mmol/L (ref 3.5–5.1)
SODIUM: 136 mmol/L (ref 135–145)
Total Bilirubin: 1.6 mg/dL — ABNORMAL HIGH (ref 0.3–1.2)
Total Protein: 6.2 g/dL — ABNORMAL LOW (ref 6.5–8.1)

## 2015-05-12 LAB — PROTIME-INR
INR: 1.62 — ABNORMAL HIGH (ref 0.00–1.49)
Prothrombin Time: 19.3 seconds — ABNORMAL HIGH (ref 11.6–15.2)

## 2015-05-12 LAB — AMMONIA: AMMONIA: 62 umol/L — AB (ref 9–35)

## 2015-05-12 MED ORDER — LACTULOSE 10 GM/15ML PO SOLN
20.0000 g | Freq: Two times a day (BID) | ORAL | Status: DC
  Administered 2015-05-12 – 2015-05-13 (×2): 20 g via ORAL
  Filled 2015-05-12 (×6): qty 30

## 2015-05-12 NOTE — Plan of Care (Signed)
Problem: Ineffective individual coping Goal: STG: Pt will be able to identify effective and ineffective STG: Pt will be able to identify effective and ineffective coping patterns  Outcome: Progressing Patient verbalized she needed to stop self medicating.

## 2015-05-12 NOTE — [facility] Group Notes (Signed)
[facility] Group Notes: (Clinical Social Work)   05/12/2015      Type of Therapy:  Group Therapy   Participation Level:  Did Not Attend despite MHT prompting   Selmer Dominion, LCSW 05/12/2015, 4:01 PM

## 2015-05-12 NOTE — Progress Notes (Signed)
Patient ID: Kristen Marsh, female   DOB: [birth year], 57 y.o.   MRN: [id number] [facility] MD Progress Note  05/12/2015 4:09 PM Shanti Agresti  MRN:  [id number] Subjective:    She states she feels "OK, tired but ok. I was all right for a little while then I started feeling tired again. " She c/o burning when urinating. Reports hx of recent catetherization.   Objective : I have discussed case with treatment team and have met with patient. Patient  Is improving  Gradually. Still in bed most of the time, but improved compared to admission, up to go to the bathroom and visualized in the dayroom.She is a poor historian and it is difficult to ascertain how long she has had symptoms such as compromised ambulation, chronic pain, poor PO intake, etc  She makes statements such as " for a while now". " long time ". Does not endorse any of these symptoms as  Being acute or new onset . It is difficult to determine at this time what her baseline functional level is. As discussed with team/ CSW, efforts have been made to contact family/ son for collateral information, without success . Patient presents with fair eye contact, in bed most of the time, but generally cooperative with staff, focuses on pain, described as above . Does not appear to be in acute distress or discomfort, however. Is not currently presenting with significant WDL symptoms- no tremors, no diaphoresis, no psychomotor agitation. At this time does not endorse medication side effects..  Of note, she is on one to one observation for safety- fall risk.   Principal Problem: Alcohol Dependence, cocaine Dependence , Substance Induced Mood Disorder- Depressed  Diagnosis:   Patient Active Problem List   Diagnosis Date Noted  . Polysubstance abuse [F19.10] 05/07/2015  . Ascites due to alcoholic cirrhosis ([place]) [E09.23] 02/28/2015  . Ascites [R18.8] 02/28/2015  . GAD (generalized anxiety disorder) [F41.1] 10/11/2014  . IBS (irritable bowel syndrome) [K58.9]  10/11/2014  . COPD bronchitis [J44.9] 10/15/2013  . Alcoholism /alcohol abuse ([place]) [F10.20] 10/15/2013  . Cirrhosis of liver ([place]) [K74.60] 08/18/2013  . Degenerative cervical disc [M50.30] 08/18/2013  . Fibromyalgia [M79.7] 08/18/2013  . Muscle cramps [R25.2] 08/18/2013  . GERD (gastroesophageal reflux disease) [K21.9] 08/18/2013   Total Time spent with patient: 25 minutes    Past Medical History:  Past Medical History  Diagnosis Date  . Osteoarthritis   . Hep C w/o coma, chronic ([place])   . Emphysema   . GERD (gastroesophageal reflux disease)   . IBS (irritable bowel syndrome)   . 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 MRSA carrier   . Fibromyalgia   . Shortness of breath     with exertion  . Hypertension   . Pneumonia     As a child  . Anxiety   . Kidney stones   . Depression   . Cirrhosis of liver ([place])   . Varicose vein     Of esophagus and top of stomach per pt    Past Surgical History  Procedure Laterality Date  . Hysteroscopy    . Appendectomy    . Abdominal hysterectomy    . Diagnostic laparoscopy      exploratory lap  . Colonoscopy w/ biopsies and polypectomy    . Direct laryngoscopy N/A 03/22/2014    Procedure: DIRECT LARYNGOSCOPY;  Surgeon: Ruby Cola, MD;  Location: [place];  Service: ENT;  Laterality: N/A;  With biopsy  . Esophagogastroduodenoscopy (egd) with  propofol N/A 07/26/2014    Procedure: ESOPHAGOGASTRODUODENOSCOPY (EGD) WITH PROPOFOL;  Surgeon: Rogene Houston, MD;  Location[facility];  Service: Endoscopy;  Laterality: N/A;  . Direct laryngoscopy N/A 01/15/2015    Procedure: DIRECT LARYNGOSCOPY WITH BIOPSY;  Surgeon: Leta Baptist, MD;  Location: [place];  Service: ENT;  Laterality: N/A;   Family History:  Family History  Problem Relation Age of Onset  . Cancer Mother     esophageal  . Cancer Brother     Liver cancer and cirrhosis age 13   Social History:  History  Alcohol Use  . 2.4 oz/week  . 4 Cans of beer per week    Comment:  1 smirnoff a day.      History  Drug Use  . Yes  . Special: Cocaine, "Crack" cocaine    Comment: Haven't used cocaine in the last 3 years    Social History   Social History  . Marital Status: Divorced    Spouse Name: N/A  . Number of Children: N/A  . Years of Education: N/A   Social History Main Topics  . Smoking status: Current Every Day Smoker -- 0.50 packs/day for 40 years    Types: Cigarettes  . Smokeless tobacco: Never Used     Comment: 1/2 pack a day at least 40 yrs  . Alcohol Use: 2.4 oz/week    4 Cans of beer per week     Comment: 1 smirnoff a day.   . Drug Use: Yes    Special: Cocaine, "Crack" cocaine     Comment: Haven't used cocaine in the last 3 years  . Sexual Activity: No   Other Topics Concern  . None   Social History Narrative   Additional Social History:   Sleep:  Improving   Appetite:   Has been poor but as per staff has eaten more today, ate breakfast and started on lunch.  Current Medications: Current Facility-Administered Medications  Medication Dose Route Frequency Provider Last Rate Last Dose  . albuterol (PROVENTIL HFA;VENTOLIN HFA) 108 (90 BASE) MCG/ACT inhaler 2 puff  2 puff Inhalation Q6H PRN Niel Hummer, NP   2 puff at 05/11/15 1841  . alum & mag hydroxide-simeth (MAALOX/MYLANTA) 200-200-20 MG/5ML suspension 30 mL  30 mL Oral Q4H PRN Niel Hummer, NP      . betamethasone valerate lotion (VALISONE) 0.1 %   Topical BID Nicholaus Bloom, MD       And  . clotrimazole (LOTRIMIN) 1 % cream   Topical BID Nicholaus Bloom, MD      . cetirizine (ZYRTEC) tablet 10 mg  10 mg Oral Daily Oswald Hillock, MD   10 mg at 05/12/15 0807  . dicyclomine (BENTYL) tablet 20 mg  20 mg Oral TID WC PRN Oswald Hillock, MD      . gabapentin (NEURONTIN) capsule 100 mg  100 mg Oral BID Jenne Campus, MD   100 mg at 05/12/15 1603  . ipratropium-albuterol (DUONEB) 0.5-2.5 (3) MG/3ML nebulizer solution 3 mL  3 mL Nebulization Q6H PRN Laverle Hobby, PA-C   3 mL at 05/10/15  2307  . lactulose (CHRONULAC) 10 GM/15ML solution 20 g  20 g Oral BID Domenic Polite, MD   20 g at 05/12/15 1603  . LORazepam (ATIVAN) tablet 1 mg  1 mg Oral Q6H PRN Jenne Campus, MD   1 mg at 05/11/15 2117  . magnesium hydroxide (MILK OF MAGNESIA) suspension 30 mL  30 mL Oral Daily PRN Niel Hummer, NP      . methocarbamol (ROBAXIN) tablet 500 mg  500 mg Oral Q8H PRN Niel Hummer, NP   500 mg at 05/11/15 2117  . multivitamin with minerals tablet 1 tablet  1 tablet Oral Daily Niel Hummer, NP   1 tablet at 05/12/15 [phone]  . oxyCODONE (Oxy IR/ROXICODONE) immediate release tablet 5 mg  5 mg Oral Q6H PRN Oswald Hillock, MD   5 mg at 05/12/15 1544  . pantoprazole (PROTONIX) EC tablet 40 mg  40 mg Oral Daily Oswald Hillock, MD   40 mg at 05/12/15 0807  . sertraline (ZOLOFT) tablet 50 mg  50 mg Oral Daily Jenne Campus, MD   50 mg at 05/12/15 0807  . thiamine (VITAMIN B-1) tablet 100 mg  100 mg Oral Daily Niel Hummer, NP   100 mg at 05/12/15 0806  . traZODone (DESYREL) tablet 50 mg  50 mg Oral QHS PRN Niel Hummer, NP   50 mg at 05/11/15 2117    Lab Results:  Results for orders placed or performed during the hospital encounter of 05/07/15 (from the past 48 hour(s))  Comprehensive metabolic panel     Status: Abnormal   Collection Time: 05/12/15  6:40 AM  Result Value Ref Range   Sodium 136 135 - 145 mmol/L   Potassium 4.1 3.5 - 5.1 mmol/L   Chloride 104 101 - 111 mmol/L   CO2 27 22 - 32 mmol/L   Glucose, Bld 91 65 - 99 mg/dL   BUN 11 6 - 20 mg/dL   Creatinine, Ser 0.47 0.44 - 1.00 mg/dL   Calcium 8.1 (L) 8.9 - 10.3 mg/dL   Total Protein 6.2 (L) 6.5 - 8.1 g/dL   Albumin 2.7 (L) 3.5 - 5.0 g/dL   AST 52 (H) 15 - 41 U/L   ALT 35 14 - 54 U/L   Alkaline Phosphatase 107 38 - 126 U/L   Total Bilirubin 1.6 (H) 0.3 - 1.2 mg/dL   GFR calc non Af Amer >60 >60 mL/min   GFR calc Af Amer >60 >60 mL/min    Comment: (NOTE) The eGFR has been calculated using the CKD EPI equation. This calculation  has not been validated in all clinical situations. eGFR's persistently <60 mL/min signify possible Chronic Kidney Disease.    Anion gap 5 5 - 15    Comment: Performed at [place]     Status: Abnormal   Collection Time: 05/12/15  6:40 AM  Result Value Ref Range   Prothrombin Time 19.3 (H) 11.6 - 15.2 seconds   INR 1.62 (H) 0.00 - 1.49    Comment: Performed at [facility]  [place]     Status: Abnormal   Collection Time: 05/12/15  6:40 AM  Result Value Ref Range   Ammonia 62 (H) 9 - 35 umol/L    Comment: Performed at [facility]    Physical Findings: AIMS: Facial and Oral Movements Muscles of Facial Expression: None, normal Lips and Perioral Area: None, normal Jaw: None, normal Tongue: None, normal,Extremity Movements Upper (arms, wrists, hands, fingers): Minimal Lower (legs, knees, ankles, toes): Minimal, Trunk Movements Neck, shoulders, hips: Minimal, Overall Severity Severity of abnormal movements (highest score from questions above): Minimal Incapacitation due to abnormal movements: Minimal Patient's awareness of abnormal movements (rate only patient's report): Aware, no distress, Dental Status Current problems with teeth and/or dentures?: Yes Does  patient usually wear dentures?: No  CIWA:  CIWA-Ar Total: 1 COWS:  COWS Total Score: 2  Musculoskeletal: Strength & Muscle Tone: within normal limits Gait & Station: unsteady Patient leans: N/A  Psychiatric Specialty Exam: ROS - describes joint, knee pains, denies nausea, denies vomiting, denies chest pain, denies shortness of breath  Blood pressure 129/58, pulse 82, temperature 98.9 F (37.2 C), temperature source Oral, resp. rate 20, SpO2 98 %.There is no weight on file to calculate BMI.  General Appearance: Disheveled  Eye Sport and exercise psychologist::  Fair  Speech:  more verbal today- speaks in whisper- hard to understand at times   Volume:  Decreased  Mood:   Depressed  Affect:  Depressed and Flat  Thought Process:  Linear  Orientation:  Full (Time, Place, and Person) she  Remains oriented to month, day of week, year, [place], [facility]- at this time does not appear delirious   Thought Content:  denies hallucinations, no delusions expressed   Suicidal Thoughts:  No today denies any thoughts of hurting self  Or of SI  Homicidal Thoughts:  No  Memory:  recent and remote fair   Judgement:  Fair  Insight:  Fair  Psychomotor Activity:  Decreased  Concentration:  Fair  Recall:  [place] of Knowledge:Good  Language: Fair  Akathisia:  Negative  Handed:  Right  AIMS (if indicated):     Assets:  Resilience  ADL's:  Impaired  Cognition: WNL  Sleep:  Number of Hours: 5.75  Assessment - patient presents as chronically ill, undernourished, in bed most of the time, but gradually becoming more amenable to get out of bed for short periods of time with staff encouragement . She is not delirious or grossly confused, and is 0x3. Speech difficulties make communication more challenging, but seems able to make her needs known. Due to patient being poor historian and staff not having been able to establish contact with family at this time, it is difficult to establish what patient's baseline status, level of functioning is . Pain and depression are reported as patient's major ongoing issues. She is not suicidal and appetite may be gradually improving. Pain described as diffuse, but mostly a burning sensation on legs suggestive of peripheral neuropathy.  New onset of burning with urination, will order appropriate testing.   Treatment Plan Summary: Daily contact with patient to assess and evaluate symptoms and progress in treatment, Medication management, Plan inpatient treatment  and medication management as below 1. Continue Ativan detox protocol to address alcohol withdrawal- PRN rather than standing detox protocol to minimize excessive  sedation. 2. Increase  Zoloft  To 50  mgrs QDAY initially to address depression 3. Continue one to one observation as patient represents fall risk, safety concerns . 4. Start  Neurontin 100 mgr BID to address neuropathic pain 5. CSW/ treatment team making efforts to establish contact with family for collateral information and disposition planning efforts  6. Continue to encourage abstinence and recovery efforts . 7. Continue Trazodone 50 mgrs QHS PRN Insomnia , if needed  8. Continue Robaxin PRNs for severe pain , if needed  9. Encourage increased milieu, group participation in order to work on coping skills/ symptom reduction  Nanci Pina  FNP-BC  05/12/2015, 4:09 PM

## 2015-05-12 NOTE — Progress Notes (Signed)
See 1:1 notes at bedside

## 2015-05-12 NOTE — Progress Notes (Signed)
Pt did not attend wrap up group tonight. 

## 2015-05-12 NOTE — Progress Notes (Signed)
D) Patient stated she slept good with the help of sleep medication. She states her appetite has been good with a low energy level and poor concentration. She rates her depression at a 5, her hopelessness at a 8, and her anxiety at 4. She states she's having pain in her back, which she rates at a 6. Her goal is to get up in mingle. Patient appears flat and consistently states she tired.  A) Scheduled medications were given. PRN medications were given for pain. Encouragement was given to go to lunch and to go to groups. R) Patient was compliant with medication. Pain did not decrease. Patient attended groups but did not go to cafeteria to eat.

## 2015-05-12 NOTE — Progress Notes (Signed)
D: Patient is resting in the bed with eyes closed. Respirations are even and unlabored. Patient appears to be in no distress. A: Staff monitors patient Q 15 mins for safety. Patient remains on 1:1 for fall risk safety. R:Patient remains safe on the unit

## 2015-05-12 NOTE — Progress Notes (Signed)
D: Patient is resting in the awake. Respirations are even and unlabored. Patient appears to be in no distress. A: Staff monitors patient Q 15 mins for safety. Patient remains on 1:1 for fall risk safety. R:Patient remains safe on the unit

## 2015-05-12 NOTE — Progress Notes (Deleted)
D: Patient is resting in the bed with eyes clothes. Respirations are even and unlabored. Patient appears to be in no distress. A: Staff monitors patient Q 15 mins for safety. Patient remains on 1:1 for fall risk safety. R:Patient remains safe on the unit

## 2015-05-12 NOTE — Progress Notes (Signed)
.  Psychoeducational Group Note    Date: 05/12/2015 Time:  0930    Goal Setting Purpose of Group: To be able to set a goal that is measurable and that can be accomplished in one day Participation Level:  Active  Participation Quality:  Appropriate  Affect:  Appropriate  Cognitive:  Oriented  Insight:  Improving  Engagement in Group:  Engaged  Additional Comments:  Pt attended the group and participated.  Somara Frymire A 

## 2015-05-12 NOTE — Progress Notes (Signed)
Pt not seen, care discussed with RN today, i will FU tomorrow  Jacinta Shoe Bettyjane Shenoy,MD [phone]

## 2015-05-12 NOTE — Progress Notes (Signed)
Psychoeducational Group Note  Date: 05/12/2015 Time:  1015  Group Topic/Focus:  Identifying Needs:   The focus of this group is to help patients identify their personal needs that have been historically problematic and identify healthy behaviors to address their needs.  Participation Level:  Active  Participation Quality:  Appropriate  Affect:  Appropriate  Cognitive:  Oriented  Insight:  Improving  Engagement in Group:  Engaged  Additional Comments:  Pt attending the group and interacting.  Bryson Dames A.Psychoeducational Group Note    Date: 05/12/2015 Time:  0930    Goal Setting Purpose of Group: To be able to set a goal that is measurable and that can be accomplished in one day Participation Level:  Active  Participation Quality:  Appropriate  Affect:  Appropriate  Cognitive:  Oriented  Insight:  Improving  Engagement in Group:  Engaged  Additional Comments:  Pt attended the group and participated.  Paulino Rily

## 2015-05-13 DIAGNOSIS — K7469 Other cirrhosis of liver: Secondary | ICD-10-CM

## 2015-05-13 DIAGNOSIS — K589 Irritable bowel syndrome without diarrhea: Secondary | ICD-10-CM

## 2015-05-13 DIAGNOSIS — F1910 Other psychoactive substance abuse, uncomplicated: Secondary | ICD-10-CM

## 2015-05-13 LAB — URINALYSIS, ROUTINE W REFLEX MICROSCOPIC
BILIRUBIN URINE: NEGATIVE
GLUCOSE, UA: NEGATIVE mg/dL
KETONES UR: NEGATIVE mg/dL
Nitrite: POSITIVE — AB
PH: 6 (ref 5.0–8.0)
Protein, ur: NEGATIVE mg/dL
SPECIFIC GRAVITY, URINE: 1.022 (ref 1.005–1.030)
Urobilinogen, UA: 1 mg/dL (ref 0.0–1.0)

## 2015-05-13 LAB — URINE MICROSCOPIC-ADD ON

## 2015-05-13 MED ORDER — LEVOFLOXACIN 250 MG PO TABS
250.0000 mg | ORAL_TABLET | Freq: Every day | ORAL | Status: DC
  Administered 2015-05-13 – 2015-05-15 (×3): 250 mg via ORAL
  Filled 2015-05-13 (×6): qty 1

## 2015-05-13 MED ORDER — LACTULOSE 10 GM/15ML PO SOLN
20.0000 g | Freq: Three times a day (TID) | ORAL | Status: DC
  Administered 2015-05-13 – 2015-05-17 (×11): 20 g via ORAL
  Filled 2015-05-13 (×17): qty 30

## 2015-05-13 MED ORDER — NITROFURANTOIN MONOHYD MACRO 100 MG PO CAPS: 100.0000 mg | ORAL_CAPSULE | Freq: Two times a day (BID) | ORAL | Status: DC

## 2015-05-13 NOTE — Progress Notes (Addendum)
 .  Psychoeducational Group Note    Date: 05/13/2015 Time: 0930   Goal Setting Purpose of Group: To be able to set a goal that is measurable and that can be accomplished in one day Participation Level:  Minimal  Participation Quality:  Drowsy  Affect:  Flat  Cognitive:  Oriented  Insight:  Limited  Engagement in Group:  Lacking  Additional Comments:  Pt came in the middle of group and fell asleep soon after. When going around the room and questions were asked, Pt was unable to answer them.  Paulino Rily

## 2015-05-13 NOTE — Progress Notes (Signed)
D: Patient stated she slept good last night with the help of pain medication. She states her energy level is low. She states her depression is at 2, her hopelessness is at a 5, and her anxiety is at a 4. She denies SI/HI/AVH. She reports pain in her back along with body aches. Her goal is to get a decent shower.  A: Medication given and reviewed with patient. Patient encouraged to go to group and complete ADLs. R: Patient compliant with medication and asked questions. Patient attended group.

## 2015-05-13 NOTE — Progress Notes (Signed)
D: Patient is resting in the bed awake. Respirations are even and unlabored. Patient appears to be in no distress. A: Staff monitors patient Q 15 mins for safety. Patient remains on 1:1 for fall risk safety. R:Patient remains safe on the unit

## 2015-05-13 NOTE — Progress Notes (Signed)
Psychoeducational Group Note  Date:  05/13/2015 Time:  2100 Group Topic/Focus:  wrap up group  Participation Level: Did Not Attend  Participation Quality:  Not Applicable  Affect:  Not Applicable  Cognitive:  Not Applicable  Insight:  Not Applicable  Engagement in Group: Not Applicable  Additional Comments:  Pt was notified that wrap up group and an AA meeting were beginning but pt remained in bed.   Jacques Navy 05/13/2015, 10:52 PM

## 2015-05-13 NOTE — Progress Notes (Addendum)
D: Patient is resting in the bed with eyes closed. Respirations are even and unlabored. Patient appears to be in no distress. A: Staff monitors patient Q 15 mins for safety. Patient remains on 1:1 for fall risk safety. R:Patient remains safe on the unit

## 2015-05-13 NOTE — Progress Notes (Addendum)
[facility] PROGRESS NOTE  Kristen Marsh TKZ:[id number] DOB: [birth year] DOA: 05/07/2015 PCP: Kristen Dun, FNP  Assessment/Plan:  Possible UTI -symptomatic -Rx with levaquin for 5days, will FU Cx peripherally and change Abx if needed  Rash continue Lotrisone cream twice a day.   History of liver cirrhosis Liver enzymes AST 61, ALT 41, total bili 2.0. Ammonia elevated, increase lactulose Cut down pain meds due to daytime somnolence -caution with too many meds that can depress CNS with her Cirrhosis history  Alcohol abuse Continue thiamine, Ativan as per CIWA protocol  Peripheral neuropathy Continue gabapentin  Thrombocytopenia, chronic Patient has platelet count of 63, likely from liver cirrhosis.   HPI/Subjective: Urinary discomfort while urinating  Objective: Filed Vitals:   05/13/15 0701  BP: 105/64  Pulse: 86  Temp:   Resp:    No intake or output data in the 24 hours ending 05/13/15 1205 Filed Weights    Exam:   General:  AAOx3, somnolent but able to carry a normal conversation  Cardiovascular: S1S2/RRR  Respiratory: CTAB  Abdomen: soft, NT, small umb hernia  Musculoskeletal: no edema c.c  Neuro: no asterixes   Data Reviewed: Basic Metabolic Panel:  Recent Labs Lab 05/06/15 1552 05/09/15 0649 05/12/15 0640  NA 138 140 136  K 3.0* 4.2 4.1  CL 106 111 104  CO2 24 25 27   GLUCOSE 85 90 91  BUN 6 10 11   CREATININE 0.58 0.50 0.47  CALCIUM 7.6* 8.1* 8.1*   Liver Function Tests:  Recent Labs Lab 05/06/15 1552 05/10/15 0635 05/12/15 0640  AST 93* 61* 52*  ALT 39 41 35  ALKPHOS 134* 118 107  BILITOT 2.5* 2.0* 1.6*  PROT 6.8 6.5 6.2*  ALBUMIN 3.0* 2.7* 2.7*   No results for input(s): LIPASE, AMYLASE in the last 168 hours.  Recent Labs Lab 05/12/15 0640  AMMONIA 62*   CBC:  Recent Labs Lab 05/06/15 1552  WBC 4.9  NEUTROABS 3.8  HGB 10.9*  HCT 33.1*  MCV 88.3  PLT 63*   Cardiac Enzymes:  Recent Labs Lab  05/06/15 1607  TROPONINI <0.03   BNP (last 3 results)  Recent Labs  05/06/15 1607 05/09/15 0649  BNP 174.0* 62.2    ProBNP (last 3 results) No results for input(s): PROBNP in the last 8760 hours.  CBG: No results for input(s): GLUCAP in the last 168 hours.  No results found for this or any previous visit (from the past 240 hour(s)).   Studies: No results found.  Scheduled Meds: . betamethasone valerate lotion   Topical BID   And  . clotrimazole   Topical BID  . cetirizine  10 mg Oral Daily  . gabapentin  100 mg Oral BID  . lactulose  20 g Oral BID  . levofloxacin  250 mg Oral Daily  . multivitamin with minerals  1 tablet Oral Daily  . pantoprazole  40 mg Oral Daily  . sertraline  50 mg Oral Daily  . thiamine  100 mg Oral Daily   Continuous Infusions:  Antibiotics Given (last 72 hours)    None      Active Problems:   Cirrhosis of liver (HCC)   Degenerative cervical disc   GERD (gastroesophageal reflux disease)   Alcoholism /alcohol abuse (HCC)   IBS (irritable bowel syndrome)   Polysubstance abuse    Time spent: 45min    Kristen Marsh  [facility] Pager [phone]. If 7PM-7AM, please contact night-coverage at [email or web address], password [facility] 05/13/2015, 12:05 PM  LOS: 5 days

## 2015-05-13 NOTE — Progress Notes (Signed)
Patient ID: Kristen Marsh, female   DOB: [birth year], 59 y.o.   MRN: [id number] [facility] MD Progress Note  05/13/2015 12:57 PM Kristen Marsh  MRN:  [id number] Subjective:    She states she feels "Im still tired all the time, just got finished walking the halls and now I am tired again. My goal today was to take a good shower  The depression hasn't bothered me, and I am always anxious but I feel better today. " She was advised of her UTI, and need to treat.   Objective : I have discussed case with treatment team and have met with patient. Patient  Is improving, she is observed walking up and down the hall. She is up to go to the bathroom, tub room and visualized in the dayroom during both groups. Her 1:1 states she was sleeping in and out of the day room. Still unclear if her symptoms such as compromised ambulation, chronic pain, poor PO intake, etc. Does not appear to be in acute distress or discomfort, however. Is not currently presenting with significant WDL symptoms- no tremors, no diaphoresis, no psychomotor agitation. At this time does not endorse medication side effects. Of note, she is on one to one observation for safety- fall risk.   Principal Problem: Alcohol Dependence, cocaine Dependence , Substance Induced Mood Disorder- Depressed  Diagnosis:   Patient Active Problem List   Diagnosis Date Noted  . Polysubstance abuse [F19.10] 05/07/2015  . Ascites due to alcoholic cirrhosis ([place]) [E99.37] 02/28/2015  . Ascites [R18.8] 02/28/2015  . GAD (generalized anxiety disorder) [F41.1] 10/11/2014  . IBS (irritable bowel syndrome) [K58.9] 10/11/2014  . COPD bronchitis [J44.9] 10/15/2013  . Alcoholism /alcohol abuse ([place]) [F10.20] 10/15/2013  . Cirrhosis of liver ([place]) [K74.60] 08/18/2013  . Degenerative cervical disc [M50.30] 08/18/2013  . Fibromyalgia [M79.7] 08/18/2013  . Muscle cramps [R25.2] 08/18/2013  . GERD (gastroesophageal reflux disease) [K21.9] 08/18/2013   Total Time spent with patient:  25 minutes    Past Medical History:  Past Medical History  Diagnosis Date  . Osteoarthritis   . Hep C w/o coma, chronic ([place])   . Emphysema   . GERD (gastroesophageal reflux disease)   . IBS (irritable bowel syndrome)   . 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 MRSA carrier   . Fibromyalgia   . Shortness of breath     with exertion  . Hypertension   . Pneumonia     As a child  . Anxiety   . Kidney stones   . Depression   . Cirrhosis of liver ([place])   . Varicose vein     Of esophagus and top of stomach per pt    Past Surgical History  Procedure Laterality Date  . Hysteroscopy    . Appendectomy    . Abdominal hysterectomy    . Diagnostic laparoscopy      exploratory lap  . Colonoscopy w/ biopsies and polypectomy    . Direct laryngoscopy N/A 03/22/2014    Procedure: DIRECT LARYNGOSCOPY;  Surgeon: Ruby Cola, MD;  Location: [place];  Service: ENT;  Laterality: N/A;  With biopsy  . Esophagogastroduodenoscopy (egd) with propofol N/A 07/26/2014    Procedure: ESOPHAGOGASTRODUODENOSCOPY (EGD) WITH PROPOFOL;  Surgeon: Rogene Houston, MD;  Location: [facility];  Service: Endoscopy;  Laterality: N/A;  . Direct laryngoscopy N/A 01/15/2015    Procedure: DIRECT LARYNGOSCOPY WITH BIOPSY;  Surgeon: Leta Baptist, MD;  Location: [place];  Service: ENT;  Laterality: N/A;  Family History:  Family History  Problem Relation Age of Onset  . Cancer Mother     esophageal  . Cancer Brother     Liver cancer and cirrhosis age 82   Social History:  History  Alcohol Use  . 2.4 oz/week  . 4 Cans of beer per week    Comment: 1 smirnoff a day.      History  Drug Use  . Yes  . Special: Cocaine, "Crack" cocaine    Comment: Haven't used cocaine in the last 3 years    Social History   Social History  . Marital Status: Divorced    Spouse Name: N/A  . Number of Children: N/A  . Years of Education: N/A   Social History Main Topics  . Smoking status: Current Every Day Smoker -- 0.50  packs/day for 40 years    Types: Cigarettes  . Smokeless tobacco: Never Used     Comment: 1/2 pack a day at least 40 yrs  . Alcohol Use: 2.4 oz/week    4 Cans of beer per week     Comment: 1 smirnoff a day.   . Drug Use: Yes    Special: Cocaine, "Crack" cocaine     Comment: Haven't used cocaine in the last 3 years  . Sexual Activity: No   Other Topics Concern  . None   Social History Narrative   Additional Social History:   Sleep:  Improving   Appetite:   Has been poor but as per staff has eaten more today, ate breakfast and started on lunch.  Current Medications: Current Facility-Administered Medications  Medication Dose Route Frequency Provider Last Rate Last Dose  . albuterol (PROVENTIL HFA;VENTOLIN HFA) 108 (90 BASE) MCG/ACT inhaler 2 puff  2 puff Inhalation Q6H PRN Niel Hummer, NP   2 puff at 05/11/15 1841  . alum & mag hydroxide-simeth (MAALOX/MYLANTA) 200-200-20 MG/5ML suspension 30 mL  30 mL Oral Q4H PRN Niel Hummer, NP      . betamethasone valerate lotion (VALISONE) 0.1 %   Topical BID Nicholaus Bloom, MD       And  . clotrimazole (LOTRIMIN) 1 % cream   Topical BID Nicholaus Bloom, MD      . cetirizine (ZYRTEC) tablet 10 mg  10 mg Oral Daily Oswald Hillock, MD   10 mg at 05/13/15 0744  . dicyclomine (BENTYL) tablet 20 mg  20 mg Oral TID WC PRN Oswald Hillock, MD      . gabapentin (NEURONTIN) capsule 100 mg  100 mg Oral BID Jenne Campus, MD   100 mg at 05/13/15 0744  . ipratropium-albuterol (DUONEB) 0.5-2.5 (3) MG/3ML nebulizer solution 3 mL  3 mL Nebulization Q6H PRN Laverle Hobby, PA-C   3 mL at 05/10/15 2307  . lactulose (CHRONULAC) 10 GM/15ML solution 20 g  20 g Oral TID Domenic Polite, MD      . levofloxacin [facility]) tablet 250 mg  250 mg Oral Daily Domenic Polite, MD   250 mg at 05/13/15 1218  . magnesium hydroxide (MILK OF MAGNESIA) suspension 30 mL  30 mL Oral Daily PRN Niel Hummer, NP      . methocarbamol (ROBAXIN) tablet 500 mg  500 mg Oral Q8H PRN Niel Hummer, NP   500 mg at 05/12/15 2102  . multivitamin with minerals tablet 1 tablet  1 tablet Oral Daily Niel Hummer, NP   1 tablet at 05/13/15 0744  . pantoprazole (PROTONIX)  EC tablet 40 mg  40 mg Oral Daily Oswald Hillock, MD   40 mg at 05/13/15 0744  . sertraline (ZOLOFT) tablet 50 mg  50 mg Oral Daily Jenne Campus, MD   50 mg at 05/13/15 0744  . thiamine (VITAMIN B-1) tablet 100 mg  100 mg Oral Daily Niel Hummer, NP   100 mg at 05/13/15 0744  . traZODone (DESYREL) tablet 50 mg  50 mg Oral QHS PRN Niel Hummer, NP   50 mg at 05/12/15 2103    Lab Results:  Results for orders placed or performed during the hospital encounter of 05/07/15 (from the past 48 hour(s))  Comprehensive metabolic panel     Status: Abnormal   Collection Time: 05/12/15  6:40 AM  Result Value Ref Range   Sodium 136 135 - 145 mmol/L   Potassium 4.1 3.5 - 5.1 mmol/L   Chloride 104 101 - 111 mmol/L   CO2 27 22 - 32 mmol/L   Glucose, Bld 91 65 - 99 mg/dL   BUN 11 6 - 20 mg/dL   Creatinine, Ser 0.47 0.44 - 1.00 mg/dL   Calcium 8.1 (L) 8.9 - 10.3 mg/dL   Total Protein 6.2 (L) 6.5 - 8.1 g/dL   Albumin 2.7 (L) 3.5 - 5.0 g/dL   AST 52 (H) 15 - 41 U/L   ALT 35 14 - 54 U/L   Alkaline Phosphatase 107 38 - 126 U/L   Total Bilirubin 1.6 (H) 0.3 - 1.2 mg/dL   GFR calc non Af Amer >60 >60 mL/min   GFR calc Af Amer >60 >60 mL/min    Comment: (NOTE) The eGFR has been calculated using the CKD EPI equation. This calculation has not been validated in all clinical situations. eGFR's persistently <60 mL/min signify possible Chronic Kidney Disease.    Anion gap 5 5 - 15    Comment: Performed at [place]     Status: Abnormal   Collection Time: 05/12/15  6:40 AM  Result Value Ref Range   Prothrombin Time 19.3 (H) 11.6 - 15.2 seconds   INR 1.62 (H) 0.00 - 1.49    Comment: Performed at [facility]  Ammonia     Status: Abnormal   Collection Time: 05/12/15  6:40 AM   Result Value Ref Range   Ammonia 62 (H) 9 - 35 umol/L    Comment: Performed at [facility]  Urinalysis, Routine w reflex microscopic (not at [facility])     Status: Abnormal   Collection Time: 05/13/15  6:30 AM  Result Value Ref Range   Color, Urine AMBER (A) YELLOW    Comment: BIOCHEMICALS MAY BE AFFECTED BY COLOR   APPearance CLOUDY (A) CLEAR   Specific Gravity, Urine 1.022 1.005 - 1.030   pH 6.0 5.0 - 8.0   Glucose, UA NEGATIVE NEGATIVE mg/dL   Hgb urine dipstick TRACE (A) NEGATIVE   Bilirubin Urine NEGATIVE NEGATIVE   Ketones, ur NEGATIVE NEGATIVE mg/dL   Protein, ur NEGATIVE NEGATIVE mg/dL   Urobilinogen, UA 1.0 0.0 - 1.0 mg/dL   Nitrite POSITIVE (A) NEGATIVE   Leukocytes, UA MODERATE (A) NEGATIVE    Comment: Performed at [facility]  Urine microscopic-add on     Status: Abnormal   Collection Time: 05/13/15  6:30 AM  Result Value Ref Range   Squamous Epithelial / LPF FEW (A) RARE   WBC, UA 3-6 <3 WBC/hpf   RBC / HPF 0-2 <  3 RBC/hpf   Bacteria, UA MANY (A) RARE   Crystals CA OXALATE CRYSTALS (A) NEGATIVE   Urine-Other MUCOUS PRESENT     Comment: Performed at [facility]    Physical Findings: AIMS: Facial and Oral Movements Muscles of Facial Expression: None, normal Lips and Perioral Area: None, normal Jaw: None, normal Tongue: None, normal,Extremity Movements Upper (arms, wrists, hands, fingers): Minimal Lower (legs, knees, ankles, toes): Minimal, Trunk Movements Neck, shoulders, hips: Minimal, Overall Severity Severity of abnormal movements (highest score from questions above): Minimal Incapacitation due to abnormal movements: Minimal Patient's awareness of abnormal movements (rate only patient's report): Aware, no distress, Dental Status Current problems with teeth and/or dentures?: Yes Does patient usually wear dentures?: No  CIWA:  CIWA-Ar Total: 1 COWS:  COWS Total Score: 2  Musculoskeletal: Strength &  Muscle Tone: within normal limits Gait & Station: unsteady Patient leans: N/A  Psychiatric Specialty Exam: ROS - describes joint, knee pains, denies nausea, denies vomiting, denies chest pain, denies shortness of breath  Blood pressure 105/64, pulse 86, temperature 98.4 F (36.9 C), temperature source Oral, resp. rate 16, SpO2 98 %.There is no weight on file to calculate BMI.  General Appearance: Disheveled  Eye Sport and exercise psychologist::  Fair  Speech:  more verbal today- speaks in whisper- hard to understand at times   Volume:  Decreased  Mood:  Depressed  Affect:  Depressed and Flat  Thought Process:  Linear  Orientation:  Full (Time, Place, and Person) she  Remains oriented to month, day of week, year, [place], [facility]- at this time does not appear delirious   Thought Content:  denies hallucinations, no delusions expressed   Suicidal Thoughts:  No today denies any thoughts of hurting self  Or of SI  Homicidal Thoughts:  No  Memory:  recent and remote fair   Judgement:  Fair  Insight:  Fair  Psychomotor Activity:  Decreased  Concentration:  Fair  Recall:  [place] of Knowledge:Good  Language: Fair  Akathisia:  Negative  Handed:  Right  AIMS (if indicated):     Assets:  Resilience  ADL's:  Impaired  Cognition: WNL  Sleep:  Number of Hours: 5.75  Assessment - patient presents as chronically ill, undernourished, in bed most of the time, but gradually becoming more amenable to get out of bed for short periods of time with staff encouragement . Speech difficulties make communication more challenging, but seems able to make her needs known. Due to patient being poor historian and staff not having been able to establish contact with family at this time, it is difficult to establish what patient's baseline status, level of functioning is . Pain described as diffuse, but mostly a burning sensation on legs suggestive of peripheral neuropathy.  New onset of burning with urination, will  order appropriate testing. Suspect CAUTI, will continue with current abx as directed. UA one week ago was negative for UTI.   Treatment Plan Summary: Daily contact with patient to assess and evaluate symptoms and progress in treatment, Medication management, Plan inpatient treatment  and medication management as below 1. Continue Ativan detox protocol to address alcohol withdrawal- PRN rather than standing detox protocol to minimize excessive sedation. 2. Increase  Zoloft  To 50  mgrs QDAY initially to address depression 3. Continue one to one observation as patient represents fall risk, safety concerns . 4. Start  Neurontin 100 mgr BID to address neuropathic pain.  **CAUTI-Under the care of Hospitalist Team for elevated LFT and  UTI. Dr. Broadus John started on Leavquin 272m po daily, despite her muscle aches. If maylgias and pain worsen during the course of Levaquin please d/c and start first line therapy such as Macrobid or Bactrim.   5. CSW/ treatment team making efforts to establish contact with family for collateral information and disposition planning efforts  6. Continue to encourage abstinence and recovery efforts . 7. Continue Trazodone 50 mgrs QHS PRN Insomnia , if needed  8. Continue Robaxin PRNs for severe pain , if needed  9. Encourage increased milieu, group participation in order to work on coping skills/ symptom reduction  SNanci Pina FNP-BC  05/13/2015, 12:57 PM

## 2015-05-13 NOTE — [facility] Group Notes (Signed)
.  [place] Group Notes: (Clinical Social Work)   05/13/2015      Type of Therapy:  Group Therapy   Participation Level:  Did Not Attend despite MHT prompting   Selmer Dominion, LCSW 05/13/2015, 4:18 PM

## 2015-05-13 NOTE — Progress Notes (Signed)
Patient ID: Kristen Marsh, female   DOB: Aug 24, 1955, 59 y.o.   MRN: [id number] 1:1 notes  05/13/2015 @ 2100 D: Patient in bed resting. Reports she had a bath and feels well. Pt reports plans to go to a 90 day program after discharge. Pt denies SI/HI/AVH. Pt reports generalized pain, rated as 5 on a 0-10 scale. No physical distress noted at this time. A: 1:1 observation for safety R: Patient remains safe. Sitter at bedside. 1:1 continues

## 2015-05-14 MED ORDER — TRAZODONE HCL 50 MG PO TABS
50.0000 mg | ORAL_TABLET | Freq: Every evening | ORAL | Status: DC | PRN
  Administered 2015-05-14 – 2015-05-17 (×7): 50 mg via ORAL
  Filled 2015-05-14 (×13): qty 1

## 2015-05-14 MED ORDER — GABAPENTIN 100 MG PO CAPS
200.0000 mg | ORAL_CAPSULE | Freq: Two times a day (BID) | ORAL | Status: DC
  Administered 2015-05-15 (×2): 200 mg via ORAL
  Filled 2015-05-14 (×5): qty 2

## 2015-05-14 MED ORDER — SERTRALINE HCL 100 MG PO TABS
100.0000 mg | ORAL_TABLET | Freq: Every day | ORAL | Status: DC
  Administered 2015-05-15 – 2015-05-17 (×3): 100 mg via ORAL
  Filled 2015-05-14 (×4): qty 1

## 2015-05-14 NOTE — Progress Notes (Signed)
Patient ID: Mavi Un, female   DOB: [birth year], 59 y.o.   MRN: [id number] 1:1 notes  05/14/2015@ 0100  D: Patient in bed sleeping. Respiration regular and unlabored. No sign of distress noted. A: 1:1 observation for safety R: Patient remains asleep. Sitter at bedside. 1:1 continues

## 2015-05-14 NOTE — Progress Notes (Signed)
1:00pm 1:1 DAR Note: Kristen Marsh has been in her room much of the morning.  She did get out and walk to the day room on the 300 hall and back to her room.  She states that she hurt but it was good to get out and walk a little.  1:1 sitter remains at her side for safety.  Kristen Marsh denies any SI/HI or A/V hallucinations.  She continues to sleep off and on but is easily aroused and will talk appropriately.  Encouraged her to get out of her room more.  She appears to be in no acute physical distress.  1:1 continues for fall risk.  Q 15 minute checks maintained for safety.

## 2015-05-14 NOTE — Progress Notes (Signed)
Adult Psychoeducational Group Note  Date:  05/14/2015 Time:  8:25 PM  Group Topic/Focus:  Wrap-Up Group:   The focus of this group is to help patients review their daily goal of treatment and discuss progress on daily workbooks.  Participation Level:  Active  Participation Quality:  Attentive  Affect:  Appropriate  Cognitive:  Appropriate  Insight: Good  Engagement in Group:  Engaged  Modes of Intervention:  Discussion  Additional Comments:  Pt rated overall day a 7 out of 10 because she "got up and made movement throughout the day". Pt noted that a positive part of her day was speaking with her son and step daughter. Pt reported that her goal for the day was to get up, which she feels that she achieved.   Lincoln Brigham 05/14/2015, 9:08 PM

## 2015-05-14 NOTE — Clinical Social Work Note (Signed)
CSW spoke with son Corene Cornea [phone] who feels that patient needs residential substance abuse treatment at discharge. CSW agreed to keep son updated with discharge plans and patient's progress.   Tilden Fossa, MSW, [place] Worker [facility] [phone]

## 2015-05-14 NOTE — Progress Notes (Signed)
Patient ID: Kristen Marsh, female   DOB: [birth year], 57 y.o.   MRN: [id number] [facility] MD Progress Note  05/14/2015 5:29 PM Kristen Marsh  MRN:  [id number] Subjective:    Patient reports she feels " about the same". Complains of diffuse abdominal discomfort , which has been intermittent. Denies medication side effects.   Objective : I have discussed case with treatment team and have met with patient. Compared to admission, patient has improved significantly. She is better related, more communicative, and has been up from bed more, at times walking on unit, slowly but more steadily. Appreciate PT assessment and recommendation. Patient's group attendance / milieu participation has been fair, but she has been somewhat more visible on unit . Her son has established contact with staff. Patient had reported her plan was to return to son's home after discharge, but more recently has expressed desire to go to an inpatient rehab after discharge, which she states her family has encouraged her to do. No disruptive or agitated behaviors on unit.  At this time does not endorse medication side effects.   Principal Problem: Alcohol Dependence, cocaine Dependence , Substance Induced Mood Disorder- Depressed  Diagnosis:   Patient Active Problem List   Diagnosis Date Noted  . Polysubstance abuse [F19.10] 05/07/2015  . Ascites due to alcoholic cirrhosis ([place]) [G01.74] 02/28/2015  . Ascites [R18.8] 02/28/2015  . GAD (generalized anxiety disorder) [F41.1] 10/11/2014  . IBS (irritable bowel syndrome) [K58.9] 10/11/2014  . COPD bronchitis [J44.9] 10/15/2013  . Alcoholism /alcohol abuse ([place]) [F10.20] 10/15/2013  . Cirrhosis of liver ([place]) [K74.60] 08/18/2013  . Degenerative cervical disc [M50.30] 08/18/2013  . Fibromyalgia [M79.7] 08/18/2013  . Muscle cramps [R25.2] 08/18/2013  . GERD (gastroesophageal reflux disease) [K21.9] 08/18/2013   Total Time spent with patient: 20 minutes   Past Medical History:  Past  Medical History  Diagnosis Date  . Osteoarthritis   . Hep C w/o coma, chronic ([place])   . Emphysema   . GERD (gastroesophageal reflux disease)   . IBS (irritable bowel syndrome)   . 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 MRSA carrier   . Fibromyalgia   . Shortness of breath     with exertion  . Hypertension   . Pneumonia     As a child  . Anxiety   . Kidney stones   . Depression   . Cirrhosis of liver ([place])   . Varicose vein     Of esophagus and top of stomach per pt    Past Surgical History  Procedure Laterality Date  . Hysteroscopy    . Appendectomy    . Abdominal hysterectomy    . Diagnostic laparoscopy      exploratory lap  . Colonoscopy w/ biopsies and polypectomy    . Direct laryngoscopy N/A 03/22/2014    Procedure: DIRECT LARYNGOSCOPY;  Surgeon: Ruby Cola, MD;  Location: [place];  Service: ENT;  Laterality: N/A;  With biopsy  . Esophagogastroduodenoscopy (egd) with propofol N/A 07/26/2014    Procedure: ESOPHAGOGASTRODUODENOSCOPY (EGD) WITH PROPOFOL;  Surgeon: Rogene Houston, MD;  Location: [facility];  Service: Endoscopy;  Laterality: N/A;  . Direct laryngoscopy N/A 01/15/2015    Procedure: DIRECT LARYNGOSCOPY WITH BIOPSY;  Surgeon: Leta Baptist, MD;  Location: [place];  Service: ENT;  Laterality: N/A;   Family History:  Family History  Problem Relation Age of Onset  . Cancer Mother     esophageal  . Cancer Brother     Liver  cancer and cirrhosis age 77   Social History:  History  Alcohol Use  . 2.4 oz/week  . 4 Cans of beer per week    Comment: 1 smirnoff a day.      History  Drug Use  . Yes  . Special: Cocaine, "Crack" cocaine    Comment: Haven't used cocaine in the last 3 years    Social History   Social History  . Marital Status: Divorced    Spouse Name: N/A  . Number of Children: N/A  . Years of Education: N/A   Social History Main Topics  . Smoking status: Current Every Day Smoker -- 0.50 packs/day for 40 years    Types: Cigarettes  .  Smokeless tobacco: Never Used     Comment: 1/2 pack a day at least 40 yrs  . Alcohol Use: 2.4 oz/week    4 Cans of beer per week     Comment: 1 smirnoff a day.   . Drug Use: Yes    Special: Cocaine, "Crack" cocaine     Comment: Haven't used cocaine in the last 3 years  . Sexual Activity: No   Other Topics Concern  . None   Social History Narrative   Additional Social History:   Sleep:  Improving   Appetite:   Has been poor but as per staff has eaten more today, ate breakfast and started on lunch.  Current Medications: Current Facility-Administered Medications  Medication Dose Route Frequency Provider Last Rate Last Dose  . albuterol (PROVENTIL HFA;VENTOLIN HFA) 108 (90 BASE) MCG/ACT inhaler 2 puff  2 puff Inhalation Q6H PRN Niel Hummer, NP   2 puff at 05/11/15 1841  . alum & mag hydroxide-simeth (MAALOX/MYLANTA) 200-200-20 MG/5ML suspension 30 mL  30 mL Oral Q4H PRN Niel Hummer, NP      . betamethasone valerate lotion (VALISONE) 0.1 %   Topical BID Nicholaus Bloom, MD       And  . clotrimazole (LOTRIMIN) 1 % cream   Topical BID Nicholaus Bloom, MD      . cetirizine (ZYRTEC) tablet 10 mg  10 mg Oral Daily Oswald Hillock, MD   10 mg at 05/14/15 0859  . dicyclomine (BENTYL) tablet 20 mg  20 mg Oral TID WC PRN Oswald Hillock, MD      . gabapentin (NEURONTIN) capsule 100 mg  100 mg Oral BID Jenne Campus, MD   100 mg at 05/14/15 1652  . ipratropium-albuterol (DUONEB) 0.5-2.5 (3) MG/3ML nebulizer solution 3 mL  3 mL Nebulization Q6H PRN Laverle Hobby, PA-C   3 mL at 05/10/15 2307  . lactulose (CHRONULAC) 10 GM/15ML solution 20 g  20 g Oral TID Domenic Polite, MD   20 g at 05/14/15 1651  . levofloxacin (LEVAQUIN) tablet 250 mg  250 mg Oral Daily Domenic Polite, MD   250 mg at 05/14/15 0859  . magnesium hydroxide (MILK OF MAGNESIA) suspension 30 mL  30 mL Oral Daily PRN Niel Hummer, NP      . methocarbamol (ROBAXIN) tablet 500 mg  500 mg Oral Q8H PRN Niel Hummer, NP   500 mg at  05/14/15 0914  . multivitamin with minerals tablet 1 tablet  1 tablet Oral Daily Niel Hummer, NP   1 tablet at 05/14/15 [id number]  . pantoprazole (PROTONIX) EC tablet 40 mg  40 mg Oral Daily Oswald Hillock, MD   40 mg at 05/14/15 0859  . sertraline (ZOLOFT) tablet 50  mg  50 mg Oral Daily Jenne Campus, MD   50 mg at 05/14/15 0858  . thiamine (VITAMIN B-1) tablet 100 mg  100 mg Oral Daily Niel Hummer, NP   100 mg at 05/14/15 0858  . traZODone (DESYREL) tablet 50 mg  50 mg Oral QHS PRN Niel Hummer, NP   50 mg at 05/13/15 2124    Lab Results:  Results for orders placed or performed during the hospital encounter of 05/07/15 (from the past 48 hour(s))  Urinalysis, Routine w reflex microscopic (not at [facility])     Status: Abnormal   Collection Time: 05/13/15  6:30 AM  Result Value Ref Range   Color, Urine AMBER (A) YELLOW    Comment: BIOCHEMICALS MAY BE AFFECTED BY COLOR   APPearance CLOUDY (A) CLEAR   Specific Gravity, Urine 1.022 1.005 - 1.030   pH 6.0 5.0 - 8.0   Glucose, UA NEGATIVE NEGATIVE mg/dL   Hgb urine dipstick TRACE (A) NEGATIVE   Bilirubin Urine NEGATIVE NEGATIVE   Ketones, ur NEGATIVE NEGATIVE mg/dL   Protein, ur NEGATIVE NEGATIVE mg/dL   Urobilinogen, UA 1.0 0.0 - 1.0 mg/dL   Nitrite POSITIVE (A) NEGATIVE   Leukocytes, UA MODERATE (A) NEGATIVE    Comment: Performed at [facility]  Culture, Urine     Status: None (Preliminary result)   Collection Time: 05/13/15  6:30 AM  Result Value Ref Range   Specimen Description      URINE, CLEAN CATCH Performed at [facility]    Special Requests      NONE Performed at [facility]    Culture      >=100,000 COLONIES/mL ESCHERICHIA COLI Performed at [facility]    Report Status PENDING   Urine microscopic-add on     Status: Abnormal   Collection Time: 05/13/15  6:30 AM  Result Value Ref Range   Squamous Epithelial / LPF FEW (A) RARE   WBC, UA 3-6 <3 WBC/hpf   RBC  / HPF 0-2 <3 RBC/hpf   Bacteria, UA MANY (A) RARE   Crystals CA OXALATE CRYSTALS (A) NEGATIVE   Urine-Other MUCOUS PRESENT     Comment: Performed at [facility]    Physical Findings: AIMS: Facial and Oral Movements Muscles of Facial Expression: None, normal Lips and Perioral Area: None, normal Jaw: None, normal Tongue: None, normal,Extremity Movements Upper (arms, wrists, hands, fingers): Minimal Lower (legs, knees, ankles, toes): Minimal, Trunk Movements Neck, shoulders, hips: Minimal, Overall Severity Severity of abnormal movements (highest score from questions above): Minimal Incapacitation due to abnormal movements: Minimal Patient's awareness of abnormal movements (rate only patient's report): Aware, no distress, Dental Status Current problems with teeth and/or dentures?: Yes Does patient usually wear dentures?: No  CIWA:  CIWA-Ar Total: 1 COWS:  COWS Total Score: 2  Musculoskeletal: Strength & Muscle Tone: within normal limits Gait & Station: unsteady Patient leans: N/A  Psychiatric Specialty Exam: ROS- describes joint, knee pains, denies nausea, denies vomiting, denies chest pain, denies shortness of breath  Blood pressure 105/53, pulse 85, temperature 98.4 F (36.9 C), temperature source Oral, resp. rate 16, SpO2 98 %.There is no weight on file to calculate BMI.  General Appearance: fairly groomed   Engineer, water::  improving  Speech:  more verbal today- speaks in whisper- hard to understand at times   Volume:  Decreased  Mood:  Depressed, but somewhat improved compared to admission  Affect:   Constricted, but does  smile briefly at times   Thought Process:  Linear  Orientation:  Full (Time, Place, and Person) she  Remains oriented to month, day of week, year, [place], [facility]- at this time does not appear delirious   Thought Content:  denies hallucinations, no delusions expressed - does not appear internally preoccupied or paranoid    Suicidal Thoughts:  No today denies any thoughts of hurting self  Or of SI  Homicidal Thoughts:  No  Memory:  recent and remote fair   Judgement:  Fair  Insight:  Fair  Psychomotor Activity:  Decreased, but improved compared to admission- more ambulatory   Concentration:  Fair  Recall:  [employer] of Knowledge:Good  Language: Fair  Akathisia:  Negative  Handed:  Right  AIMS (if indicated):     Assets:  Resilience  ADL's:  Impaired  Cognition: WNL  Sleep:  Number of Hours: 6.25  Assessment - patient making partial but significant improvement compared to her admission presentation. She is now more mobile, spending more time out of bed, walking short distances, gait more stable ( has been evaluated by PT ) , and with a more reactive affect . Denies medication side effects. She has expressed desire to return home to her son after discharge, but reports she has been encouraged by family to consider going to  A residential rehab setting , if possible .   Treatment Plan Summary: Daily contact with patient to assess and evaluate symptoms and progress in treatment, Medication management, Plan inpatient treatment  and medication management as below 1. Continue to encourage increased milieu, group participation to work on coping skills and symptom reduction 2. Increase  Zoloft  To 100  mgrs QDAY initially to address  Ongoing depression 3.  I have discussed case with Nursing Staff- at this time Continue one to one observation  For fall risk- will reevaluate, as she becomes more mobile and gait continues to improve  4. Increase   Neurontin to 200  mgr BID to address neuropathic pain.  5. On Levaquin for UTI 5. CSW/ treatment team working on disposition plans  6. Continue to encourage abstinence and recovery efforts . 7. Continue Trazodone 50 mgrs QHS PRN Insomnia , if needed  8. Continue Robaxin PRNs for severe pain , if needed    COBOS, FERNANDO   MD  05/14/2015, 5:29 PM

## 2015-05-14 NOTE — BHH Group Notes (Signed)
[facility] LCSW Group Therapy 05/14/2015  1:15 PM   Type of Therapy: Group Therapy  Participation Level: Did Not Participate. Patient came into group late, stayed for a few minutes, then left without returning.   Tilden Fossa, MSW, [place] Worker [facility] [phone]

## 2015-05-14 NOTE — Progress Notes (Signed)
Patient ID: Kristen Marsh, female   DOB: 10-Jun-1956, 59 y.o.   MRN: [id number] 1:1 notes  05/14/2015@ 0500  D: Patient in bed sleeping. Respiration regular and unlabored. No sign of distress noted. A: 1:1 observation for safety R: Patient remains asleep. Sitter at bedside. 1:1 continues

## 2015-05-14 NOTE — Progress Notes (Signed)
9am 1:1 Note Kristen Marsh remains on 1:1 for fall safety risk.  She has sitter by side.  She has been in bed this morning.  She did get up to get medications and walked with the sitter.  She denies any SI/HI or A/V hallucinations.  She completed her self inventory and reports that her depression 3/10, hopelessness 8/10 and anxiety 0/10.  Her goal for today is to try and walk more up and down the hall.  She is able to get up on her own but needs assistance to stabilize at times.  1:1 will continue for safety.  Q 15 minute checks maintained for safety.

## 2015-05-14 NOTE — Progress Notes (Signed)
Physical Therapy Treatment Patient Details Name: Kristen Marsh MRN: [id number] DOB: 01-Aug-1956 Today's Date: 05/14/2015    History of Present Illness Pt admitted [place] 2* polysubstance abuse.  Pt states she ambulated with cane prior to admit but distance limited by pain associated with osteoarthritis "all over".  Pt states she would drink to help with the pain    PT Comments    Pt reports she is amb to all activities and meals without the need of the RW that was issued to her at previous Tx.  Assisted with amb in hallway and observed a good alternating, slightly shuffled, but functional gait.  No LOB.  Demonstrates good safety cognition.  Minor c/o arthritic pain "all over".  Performed BERG balance test in which pt scored 51/56 indicating low fall risk and no need for any AD.  Will consult and update LPT pt's progress.   Follow Up Recommendations  No PT follow up     Equipment Recommendations       Recommendations for Other Services       Precautions / Restrictions Precautions Precautions: Fall Restrictions Weight Bearing Restrictions: No    Mobility  Bed Mobility Overal bed mobility: Modified Independent             General bed mobility comments: Pt in/out bed unassisted but with increased time  Transfers Overall transfer level: Modified independent               General transfer comment: good safety cognition and use of hands to steady self  Ambulation/Gait Ambulation/Gait assistance: Supervision Ambulation Distance (Feet): 185 Feet Assistive device: None Gait Pattern/deviations: Step-through pattern;Decreased stride length;Shuffle     General Gait Details: pt amb with out the aide of an assistive device as prior needed.  Pt reports, she is amb to all activities and meals with her sitter.  No LOB and functional speed.    Stairs            Wheelchair Mobility    Modified Rankin (Stroke Patients Only)       Balance Overall balance assessment:  Modified Independent      Berg Balance test performed Scored 51/56                            Cognition Arousal/Alertness: Awake/alert Behavior During Therapy: WFL for tasks assessed/performed Overall Cognitive Status: Within Functional Limits for tasks assessed                      Exercises      General Comments        Pertinent Vitals/Pain Pain Assessment: Faces Faces Pain Scale: Hurts a little bit Pain Location: "all over" arthritis Pain Descriptors / Indicators: Constant Pain Intervention(s): Monitored during session;Repositioned    Home Living                      Prior Function            PT Goals (current goals can now be found in the care plan section)      Frequency       PT Plan Other (comment) (will consult and updat LPT pt's mobility progress)    Co-evaluation             End of Session   Activity Tolerance: Patient tolerated treatment well Patient left: in bed;with family/visitor present Conservator, museum/gallery)     Time: 8127-5170 PT Time Calculation (  min) (ACUTE ONLY): 20 min  Charges:  $Gait Training: 8-22 mins                    G Codes:      Rica Koyanagi  PTA [facility]  Acute  Rehab Pager      [phone]

## 2015-05-14 NOTE — Progress Notes (Signed)
5pm 1:1 DAR Note: Kristen Marsh has been visible on the unit more today.  She was seen walking with PT and with 1:1 worker.  She has had no episodes of falling.  She was able to take a shower with 1:1 beside.  She denies any SI/HI or A/V hallucinations.  Discussed with Dr. Parke Poisson and plan to leave her on 1:1 and re-evaluate tomorrow.  She remains pleasant and cooperative.  She appears to be in no physical distress.  Encouraged her to get out more and attend some groups.  1:1 continued for safety.  Q 15 minute checks for safety.  We will continue to monitor the progress towards her goals.

## 2015-05-14 NOTE — Tx Team (Signed)
Interdisciplinary Treatment Plan Update (Adult) Date: 05/14/2015   Date: 05/14/2015 11:32 AM  Progress in Treatment:  Attending groups: No Participating in groups: No Taking medication as prescribed: Yes  Tolerating medication: Yes  Family/Significant othe contact made: No, CSW attempting to make contact with son. Patient understands diagnosis: Yes Discussing patient identified problems/goals with staff: Yes  Medical problems stabilized or resolved: Yes  Denies suicidal/homicidal ideation: Yes Patient has not harmed self or Others: Yes   New problem(s) identified: None identified at this time.   Discharge Plan or Barriers:  10/6: Pt plans to discharge to her son's home; CSW assessing for appropriate aftercare 10/10: Patient expresses interest in a residential substance abuse treatment center.  Additional comments: n/a   Reason for Continuation of Hospitalization:  Depression Medication stabilization Withdrawal symptoms  Estimated length of stay: 2-3 days  Review of initial/current patient goals per problem list:   1.  Goal(s): Patient will participate in aftercare plan  Met:  Progressing  Target date: 3-5 days from date of admission   As evidenced by: Patient will participate within aftercare plan AEB aftercare provider and housing plan at discharge being identified.   05/10/15: Pt plans to discharge to her son's house; CSW assessing for appropriate referrals    10/10: Patient expresses interest in a residential substance abuse treatment center.  2.  Goal (s): Patient will exhibit decreased depressive symptoms and suicidal ideations.  Met:  Progressing  Target date: 3-5 days from date of admission   As evidenced by: Patient will utilize self rating of depression at 3 or below and demonstrate decreased signs of depression or be deemed stable for discharge by MD.  05/10/15: Pt reports feeling "kind of" depression. Continuing to assess  10/10: Goal progressing.  Patient continues to isolate from milieu and does not participate in therapeutic activities.  4.  Goal(s): Patient will demonstrate decreased signs of withdrawal due to substance abuse  Met:  Yes  Target date: 3-5 days from date of admission   As evidenced by: Patient will produce a CIWA/COWS score of 0, have stable vitals signs, and no symptoms of withdrawal  05/10/15: Pt denies withdrawal symptoms and CIWA score of 0.   Attendees: Patient:    Family:    Physician: Dr. Parke Poisson; Dr. Sabra Heck 05/14/2015 9:30 AM  Nursing: Eulogio Bear, Grayland Ormond, RN 05/14/2015 9:30 AM  Clinical Social Worker: Tilden Fossa, [place] 05/14/2015 9:30 AM  Other: Louie Bun Smart LCSWA  05/14/2015 9:30 AM  Other: Lucinda Dell, [place] Liaison 05/14/2015 9:30 AM  Other:  05/14/2015 9:30 AM  Other: Norberto Sorenson, P4CC 05/14/2015 9:30 AM  Other:      Tilden Fossa, MSW, [place] Worker [facility] [phone]

## 2015-05-14 NOTE — Progress Notes (Signed)
Patient ID: Kristen Marsh, female   DOB: Jan 04, 1956, 59 y.o.   MRN: [id number] 1:1 notes  05/14/2015 @ 2100 D: Patient in bed resting. Reports she had a better day, walked up and down the halls. Pt reports not sure of what son and social worker has planned after discharge but but is willing to go to treatment center or cruise. Pt pleasant and appeared at ease.  Pt denies SI/HI/AVH. No physical distress noted at this time. A: 1:1 observation for safety.  R: Patient remains safe. Pt attended evening wrap up group. Sitter at pt side. 1:1 continues

## 2015-05-14 NOTE — BHH Group Notes (Signed)
[facility] LCSW Aftercare Discharge Planning Group Note  05/14/2015  8:45 AM  Participation Quality: Did Not Attend. Patient invited to participate but declined.  Tilden Fossa, MSW, [place] Worker [facility] [phone]

## 2015-05-15 LAB — URINE CULTURE: Culture: >=100000

## 2015-05-15 MED ORDER — GABAPENTIN 300 MG PO CAPS
300.0000 mg | ORAL_CAPSULE | Freq: Two times a day (BID) | ORAL | Status: DC
  Administered 2015-05-16: 300 mg via ORAL
  Filled 2015-05-15 (×6): qty 1

## 2015-05-15 MED ORDER — LEVOFLOXACIN 250 MG PO TABS
250.0000 mg | ORAL_TABLET | Freq: Every day | ORAL | Status: AC
  Administered 2015-05-16 – 2015-05-17 (×2): 250 mg via ORAL
  Filled 2015-05-15 (×2): qty 1

## 2015-05-15 NOTE — Progress Notes (Signed)
Patient ID: Kristen Marsh, female   DOB: 05-01-1956, 59 y.o.   MRN: [id number]  Pt lying in bed. Pt reports being anxious about her seeing her son. Pt still complaining of pain.   Pt offered fluids. Pt given 1:1 about substance abuse and pain medication.   Pt in no current distress.

## 2015-05-15 NOTE — Progress Notes (Signed)
Adult Psychoeducational Group Note  Date:  05/15/2015 Time:  8:35 PM  Group Topic/Focus:  Wrap-Up Group:   The focus of this group is to help patients review their daily goal of treatment and discuss progress on daily workbooks.  Participation Level:  Active  Participation Quality:  Appropriate and Attentive  Affect:  Appropriate  Cognitive:  Appropriate  Insight: Good  Engagement in Group:  Engaged  Modes of Intervention:  Discussion  Additional Comments:  Pt reported that she felt better today because she was released from her 1:1. Pt reported that her goal for the day was to see her son, which was not achieved, but she says that she will see him tomorrow.   Lincoln Brigham 05/15/2015, 9:36 PM

## 2015-05-15 NOTE — Progress Notes (Signed)
Patient ID: Kristen Marsh, female   DOB: Feb 14, 1956, 59 y.o.   MRN: [id number] [facility] MD Progress Note  05/15/2015 5:51 PM Mili Piltz  MRN:  [id number] Subjective:    Patient reports feeling " a little bit better". She has complained of ongoing diffuse pain, but acknowledges improvement. Denies medication side effects.  Objective : I have discussed case with treatment team and have met with patient. Patient continues to improve gradually, but compared to admission she is much improved at this time. She is alert, attentive, better groomed, more visible on unit, more interactive with others ( although group participation remains limited ) , presenting with a fuller range of affect . She is tolerating medications well, and at present denies side effects. No disruptive or agitated behaviors on unit.  She is stating she wants to go to a rehab setting after discharge, and treatment team is working on referral options.  Of note, gait is much improved compared to admission- she now walks more steadily, without assistance, and as per staff there have been no falls. Patient has been on one to one observation based on fall risk. Will D/C this level of observation, as patient now significantly improved    Principal Problem: Alcohol Dependence, cocaine Dependence , Substance Induced Mood Disorder- Depressed  Diagnosis:   Patient Active Problem List   Diagnosis Date Noted  . Polysubstance abuse [F19.10] 05/07/2015  . Ascites due to alcoholic cirrhosis ([place]) [Q00.86] 02/28/2015  . Ascites [R18.8] 02/28/2015  . GAD (generalized anxiety disorder) [F41.1] 10/11/2014  . IBS (irritable bowel syndrome) [K58.9] 10/11/2014  . COPD bronchitis [J44.9] 10/15/2013  . Alcoholism /alcohol abuse ([place]) [F10.20] 10/15/2013  . Cirrhosis of liver ([place]) [K74.60] 08/18/2013  . Degenerative cervical disc [M50.30] 08/18/2013  . Fibromyalgia [M79.7] 08/18/2013  . Muscle cramps [R25.2] 08/18/2013  . GERD (gastroesophageal reflux  disease) [K21.9] 08/18/2013   Total Time spent with patient: 20 minutes   Past Medical History:  Past Medical History  Diagnosis Date  . Osteoarthritis   . Hep C w/o coma, chronic ([place])   . Emphysema   . GERD (gastroesophageal reflux disease)   . IBS (irritable bowel syndrome)   . 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 MRSA carrier   . Fibromyalgia   . Shortness of breath     with exertion  . Hypertension   . Pneumonia     As a child  . Anxiety   . Kidney stones   . Depression   . Cirrhosis of liver ([place])   . Varicose vein     Of esophagus and top of stomach per pt    Past Surgical History  Procedure Laterality Date  . Hysteroscopy    . Appendectomy    . Abdominal hysterectomy    . Diagnostic laparoscopy      exploratory lap  . Colonoscopy w/ biopsies and polypectomy    . Direct laryngoscopy N/A 03/22/2014    Procedure: DIRECT LARYNGOSCOPY;  Surgeon: Ruby Cola, MD;  Location: [place];  Service: ENT;  Laterality: N/A;  With biopsy  . Esophagogastroduodenoscopy (egd) with propofol N/A 07/26/2014    Procedure: ESOPHAGOGASTRODUODENOSCOPY (EGD) WITH PROPOFOL;  Surgeon: Rogene Houston, MD;  Location: [facility];  Service: Endoscopy;  Laterality: N/A;  . Direct laryngoscopy N/A 01/15/2015    Procedure: DIRECT LARYNGOSCOPY WITH BIOPSY;  Surgeon: Leta Baptist, MD;  Location: [place];  Service: ENT;  Laterality: N/A;   Family History:  Family History  Problem Relation  Age of Onset  . Cancer Mother     esophageal  . Cancer Brother     Liver cancer and cirrhosis age 91   Social History:  History  Alcohol Use  . 2.4 oz/week  . 4 Cans of beer per week    Comment: 1 smirnoff a day.      History  Drug Use  . Yes  . Special: Cocaine, "Crack" cocaine    Comment: Haven't used cocaine in the last 3 years    Social History   Social History  . Marital Status: Divorced    Spouse Name: N/A  . Number of Children: N/A  . Years of Education: N/A   Social History Main  Topics  . Smoking status: Current Every Day Smoker -- 0.50 packs/day for 40 years    Types: Cigarettes  . Smokeless tobacco: Never Used     Comment: 1/2 pack a day at least 40 yrs  . Alcohol Use: 2.4 oz/week    4 Cans of beer per week     Comment: 1 smirnoff a day.   . Drug Use: Yes    Special: Cocaine, "Crack" cocaine     Comment: Haven't used cocaine in the last 3 years  . Sexual Activity: No   Other Topics Concern  . None   Social History Narrative   Additional Social History:   Sleep:  Improving   Appetite:   Has been poor but as per staff has eaten more today, ate breakfast and started on lunch.  Current Medications: Current Facility-Administered Medications  Medication Dose Route Frequency Provider Last Rate Last Dose  . albuterol (PROVENTIL HFA;VENTOLIN HFA) 108 (90 BASE) MCG/ACT inhaler 2 puff  2 puff Inhalation Q6H PRN Niel Hummer, NP   2 puff at 05/11/15 1841  . alum & mag hydroxide-simeth (MAALOX/MYLANTA) 200-200-20 MG/5ML suspension 30 mL  30 mL Oral Q4H PRN Niel Hummer, NP      . betamethasone valerate lotion (VALISONE) 0.1 %   Topical BID Nicholaus Bloom, MD       And  . clotrimazole (LOTRIMIN) 1 % cream   Topical BID Nicholaus Bloom, MD      . cetirizine (ZYRTEC) tablet 10 mg  10 mg Oral Daily Oswald Hillock, MD   10 mg at 05/15/15 0854  . dicyclomine (BENTYL) tablet 20 mg  20 mg Oral TID WC PRN Oswald Hillock, MD   20 mg at 05/15/15 1517  . gabapentin (NEURONTIN) capsule 200 mg  200 mg Oral BID Jenne Campus, MD   200 mg at 05/15/15 1723  . ipratropium-albuterol (DUONEB) 0.5-2.5 (3) MG/3ML nebulizer solution 3 mL  3 mL Nebulization Q6H PRN Laverle Hobby, PA-C   3 mL at 05/14/15 2059  . lactulose (CHRONULAC) 10 GM/15ML solution 20 g  20 g Oral TID Domenic Polite, MD   20 g at 05/15/15 1723  . [START ON 05/16/2015] levofloxacin (LEVAQUIN) tablet 250 mg  250 mg Oral Daily Domenic Polite, MD      . magnesium hydroxide (MILK OF MAGNESIA) suspension 30 mL  30 mL Oral  Daily PRN Niel Hummer, NP      . methocarbamol (ROBAXIN) tablet 500 mg  500 mg Oral Q8H PRN Niel Hummer, NP   500 mg at 05/15/15 1149  . multivitamin with minerals tablet 1 tablet  1 tablet Oral Daily Niel Hummer, NP   1 tablet at 05/15/15 0854  . pantoprazole (PROTONIX) EC tablet  40 mg  40 mg Oral Daily Oswald Hillock, MD   40 mg at 05/15/15 0854  . sertraline (ZOLOFT) tablet 100 mg  100 mg Oral Daily Jenne Campus, MD   100 mg at 05/15/15 0854  . thiamine (VITAMIN B-1) tablet 100 mg  100 mg Oral Daily Niel Hummer, NP   100 mg at 05/15/15 0854  . traZODone (DESYREL) tablet 50 mg  50 mg Oral QHS,MR X 1 Laverle Hobby, PA-C   50 mg at 05/14/15 2330    Lab Results:  No results found for this or any previous visit (from the past 48 hour(s)).  Physical Findings: AIMS: Facial and Oral Movements Muscles of Facial Expression: None, normal Lips and Perioral Area: None, normal Jaw: None, normal Tongue: None, normal,Extremity Movements Upper (arms, wrists, hands, fingers): Minimal Lower (legs, knees, ankles, toes): Minimal, Trunk Movements Neck, shoulders, hips: Minimal, Overall Severity Severity of abnormal movements (highest score from questions above): Minimal Incapacitation due to abnormal movements: Minimal Patient's awareness of abnormal movements (rate only patient's report): Aware, no distress, Dental Status Current problems with teeth and/or dentures?: Yes Does patient usually wear dentures?: No  CIWA:  CIWA-Ar Total: 1 COWS:  COWS Total Score: 2  Musculoskeletal: Strength & Muscle Tone: within normal limits Gait & Station: unsteady Patient leans: N/A  Psychiatric Specialty Exam: ROS- describes joint, knee pains, denies nausea, denies vomiting, denies chest pain, denies shortness of breath  Blood pressure 111/52, pulse 78, temperature 98.4 F (36.9 C), temperature source Oral, resp. rate 20, SpO2 97 %.There is no weight on file to calculate BMI.  General Appearance:   Improved grooming   Eye Contact::  improving  Speech:  more verbal today- speaks in whisper- hard to understand at times   Volume:  Decreased  Mood:   Less depressed   Affect:    Gradually becoming more reactive   Thought Process:  Linear  Orientation:  Full (Time, Place, and Person)   Thought Content:  denies hallucinations, no delusions expressed - does not appear internally preoccupied or paranoid   Suicidal Thoughts:  No today denies any thoughts of hurting self  Or of SI  Homicidal Thoughts:  No  Memory:  recent and remote fair   Judgement:  Other:  improving   Insight:  improving   Psychomotor Activity:   Improved, more visible on unit, improved gait   Concentration:  Fair  Recall:  Good  Fund of Knowledge:Good  Language: Fair  Akathisia:  Negative  Handed:  Right  AIMS (if indicated):     Assets:  Resilience  ADL's:  Impaired  Cognition: WNL  Sleep:  Number of Hours: 6.25  Assessment - continues to improve gradually but significantly compared to her presentation and status at admission. She is noticed to have improved mood, improved range of affect, is more mobile, and her gait is now steadier, able to ambulate independently. She is tolerating medications well. She is expressing interest of going to a Rehab setting after discharge .  Treatment Plan Summary: Daily contact with patient to assess and evaluate symptoms and progress in treatment, Medication management, Plan inpatient treatment  and medication management as below 1. Continue to encourage increased milieu, group participation to work on coping skills and symptom reduction 2. Continue   Zoloft   100  mgrs QDAY initially to address  depression 3.  I have discussed case with Nursing Staff- at this time  Discontinue  one to one observation  ( which  was for fall risk ), based on improved gait . 4. Increase Neurontin to 300   mgr BID to address neuropathic pain.  5. On Levaquin for UTI 5. CSW/ treatment team working on  disposition plans - as noted, patient , family interested in residential, rehab program.  6. Continue to encourage abstinence and recovery efforts . 7. Continue Trazodone 50 mgrs QHS PRN Insomnia , if needed  8. Continue Robaxin PRNs for severe pain , if needed    COBOS, Felicita Gage   MD  05/15/2015, 5:51 PM

## 2015-05-15 NOTE — Plan of Care (Signed)
Problem: Diagnosis: Increased Risk For Suicide Attempt Goal: STG-Patient Will Comply With Medication Regime Outcome: Progressing Pt complaint with medication

## 2015-05-15 NOTE — Clinical Social Work Note (Signed)
[facility] referral made.  Tilden Fossa, MSW, [place] Worker [facility] [phone]

## 2015-05-15 NOTE — Progress Notes (Signed)
Patient ID: Kristen Marsh, female   DOB: [birth year], 59 y.o.   MRN: [id number]    Pt earlier seen standing in hallway near [address] talking with a female pt. Pt currently in the room on commode.   Pt reports, "I'm in so much pain right now."  Pt given heat pack for back pain, no prn meds available. Pt in no current distress.

## 2015-05-15 NOTE — Progress Notes (Signed)
Patient ID: Kristen Marsh, female   DOB: [birth year], 59 y.o.   MRN: [id number]   Pt seen by MD. Pt in bed speaking with MD about POC. Pt states "I just want to lay here." Pt in no current distress.   Pt given am medications. Pt encouraged to attend groups and stay near the nurses station due to limited communication ability. Pt also oriented to emergency button in room.   Sitter discontinued. Pt currently washing her face and put on her clothes. Pt offered fluids, received a Gatorade. Pt currently denies SI/HI and AVH. Pt gait steady.

## 2015-05-15 NOTE — Progress Notes (Signed)
Recreation Therapy Notes  Animal-Assisted Activity (AAA) Program Checklist/Progress Notes Patient Eligibility Criteria Checklist & Daily Group note for Rec Tx Intervention  Date: 10.11.2016 Time: 2:45pm Location: [address] [employer]    AAA/T Program Assumption of Risk Form signed by Patient/ or Parent Legal Guardian yes  Patient is free of allergies or sever asthma yes  Patient reports no fear of animals yes  Patient reports no history of cruelty to animals yes  Patient understands his/her participation is voluntary yes  Patient washes hands before animal contact yes  Patient washes hands after animal contact yes  Behavioral Response: Appropriate   Education: Hand Washing, Appropriate Animal Interaction   Education Outcome: Acknowledges education.   Clinical Observations/Feedback: Patient engaged appropriately in session, petting therapy appropriately and interacting with peers appropriately.   Laureen Ochs Hal Norrington, LRT/CTRS        [place] Surges L 05/15/2015 3:11 PM

## 2015-05-15 NOTE — Progress Notes (Signed)
Patient ID: Kristen Marsh, female   DOB: 05/03/1956, 59 y.o.   MRN: [id number] 1:1 notes  05/15/2015@ 0600  D: Patient in bed sleeping. Respiration regular and unlabored. No sign of distress noted. A: 1:1 observation for safety R: Patient remains asleep. Sitter at bedside. 1:1 continues

## 2015-05-15 NOTE — Progress Notes (Signed)
Patient ID: Kristen Marsh, female   DOB: 21-May-1956, 59 y.o.   MRN: [id number]  Pt reports wanting to take a shower. Pt smiling in hallway.   Pt given toiletries for a shower. Pleasant interaction with Probation officer.   Pt gait is steady. Pt showered with no problems, gait steady. Will continue to monitor.

## 2015-05-15 NOTE — Plan of Care (Signed)
Problem: Alteration in mood & ability to function due to Goal: STG: Patient verbalizes decreases in signs of withdrawal Outcome: Progressing Pt denies any withdrawal symptoms

## 2015-05-15 NOTE — Progress Notes (Signed)
Patient ID: Kristen Marsh, female   DOB: [birth year], 59 y.o.   MRN: [id number] 1:1 notes  05/15/2015@ 0100  D: Patient in bed sleeping. Respiration regular and unlabored. No sign of distress noted. A: 1:1 observation for safety R: Patient remains asleep. Sitter at bedside. 1:1 continues

## 2015-05-15 NOTE — Progress Notes (Signed)
Patient ID: Kristen Marsh, female   DOB: 22-Apr-1956, 59 y.o.   MRN: [id number]  Pt currently presents with a flat affect and depressed behavior. Per self inventory, pt rates depression at a 4, hopelessness 4 and anxiety 6. Pt's daily goal is to "to feel good when my son comes" and they intend to do so by "try to let all other things go." Pt reports poor sleep, a fair appetite, low energy and poor concentration. Pt main complaint is pain today. Pt reports to Probation officer, "If the doctor here doesn't give me pain medications, I'm going to have to go out on the streets to get them."  Pt provided with medications per providers orders. Pt's labs and vitals were monitored throughout the day. Pt supported emotionally and encouraged to express concerns and questions. Pt educated on medications. 1:1 removed per MD. See Notes.   Pt's safety ensured with 15 minute and environmental checks. Pt currently denies SI/HI and A/V hallucinations. Pt verbally agrees to seek staff if SI/HI or A/VH occurs and to consult with staff before acting on these thoughts. Pt gait steady, interacts well with peers. Will continue POC.

## 2015-05-15 NOTE — [facility] Group Notes (Signed)
[place] LCSW Group Therapy 05/15/2015 1:15 PM  Type of Therapy: Group Therapy- Feelings about Diagnosis  Pt attended group briefly but did not participate in group discussion; left early in discussion and did not return.  Peri Maris, [place] 05/15/2015 4:29 PM

## 2015-05-15 NOTE — Progress Notes (Addendum)
Patient ID: Kristen Marsh, female   DOB: 07/01/1956, 59 y.o.   MRN: [id number]  Pt currently in cafeteria eating meal.   Pt given lunchtime medications, tolerated well.   Pt in no current distress, interacting with peers.

## 2015-05-15 NOTE — Progress Notes (Signed)
Patient ID: Kristen Marsh, female   DOB: 08/30/1955, 59 y.o.   MRN: [id number] D: Patient in dayroom on approach. Observed interacting with peers and walking around. Pt denies SI/HI/AVH. Pt attended and participated in evening wrap up group. Cooperative with assessment. No acute distressed noted at this time.   A: Met with pt 1:1. Medications administered as prescribed. Support and encouragement provided. Pt encouraged to discuss feelings and come to staff with any question or concerns.   R: Patient remains safe and complaint with medications. Pt attended evening wrap up group.  

## 2015-05-15 NOTE — Progress Notes (Signed)
Patient ID: Kristen Marsh, female   DOB: 1956/01/21, 59 y.o.   MRN: [id number] 1:1 notes  05/15/2015@ 0200  D: Patient in bed sleeping. Respiration regular and unlabored. No sign of distress noted. A: 1:1 observation for safety R: Patient remains asleep. Sitter at bedside. 1:1 continues

## 2015-05-15 NOTE — Progress Notes (Signed)
Adult Psychoeducational Group Note  Date:  05/15/2015 Time: 09:00am  Group Topic/Focus:  Orientation:   The focus of this group is to educate the patient on the purpose and policies of crisis stabilization and provide a format to answer questions about their admission.  The group details unit policies and expectations of patients while admitted.  Participation Level:  Did Not Attend  Participation Quality:  n/a  Affect: n/a  Cognitive: n/a  Insight: n/a  Engagement in Group:  n/a  Modes of Intervention:  Education, Orientation and Support  Additional Comments:  Pt did not attend group. Pt in bed asleep.   Elenore Rota 05/15/2015, 10:43 AM

## 2015-05-16 MED ORDER — GABAPENTIN 300 MG PO CAPS
300.0000 mg | ORAL_CAPSULE | Freq: Three times a day (TID) | ORAL | Status: DC
  Administered 2015-05-16 – 2015-05-18 (×5): 300 mg via ORAL
  Filled 2015-05-16 (×8): qty 1

## 2015-05-16 MED ORDER — IBUPROFEN 400 MG PO TABS
400.0000 mg | ORAL_TABLET | Freq: Two times a day (BID) | ORAL | Status: DC | PRN
  Administered 2015-05-16 – 2015-05-18 (×4): 400 mg via ORAL
  Filled 2015-05-16 (×5): qty 1

## 2015-05-16 NOTE — Progress Notes (Signed)
D- Patient is flat and anxious with a depressed mood. Patient is cooperative/pleasant/brightens on approach. Patient currently denies SI/HI/AVH.Marland Kitchen She continues to c/o generalized weakness verbalizes that she wants lyrica.  Explained to patient she is on neurontin and that will help with her pain. Discussed using coping mechanisms to distract her from her fibromyalgia pain. Patient rates her depression, feelings of hopelessness, and anxiety, all a "'3/10" with 10 being the worst. Patient's goal for today is to learn how to cope with her depression and to develop coping mechasims.Patient concerned about constipation. Patient did state she had a BM yesterday.  Md notified and pharmacist consulted.  Action taken according to treament plan.   See MAR.   A- Scheduled medications administered to patient, per MD orders. Support and encouragement provided. Routine safety checks conducted every 15 minutes. Patient informed to notify staff with problems or concerns.  Patient encouraged to increase activity and increase fluid intake to help with constipation.   R- No adverse drug reactions noted. Patient contracts for safety at this time. Patient compliant with medications and treatment plan. Patient receptive, and cooperative. Patient interacts well with others on the unit. Patient remains safe at his time.

## 2015-05-16 NOTE — Progress Notes (Signed)
Physical Therapy Discharge Patient Details Name: Kristen Marsh MRN: [id number] DOB: 26-Jan-1956 Today's Date: 05/16/2015 Time:  -     Patient discharged from PT services secondary to goals met and no further PT needs identified.  Please see latest therapy progress note for current level of functioning and progress toward goals.    Progress and discharge plan discussed with patient and/or caregiver: Patient/Caregiver agrees with plan  GP  I have reviewed the note written 05/14/15 by and spoken with Rica Koyanagi PTA and agree this pt has met all goals and is mobilizing independently and safely.  PT services will be dc at this time.  Please reorder if pt status changes.  [name] 05/16/2015, 4:31 PM

## 2015-05-16 NOTE — Progress Notes (Signed)
Recreation Therapy Notes  Date: 10.12.2016 Time: 9:30am Location: [address] Group Room   Group Topic: Stress Management  Goal Area(s) Addresses:  Patient will actively participate in stress management techniques presented during session.   Behavioral Response: Did not attend.  Laureen Ochs Theus Espin, LRT/CTRS        Sarann Tregre L 05/16/2015 1:04 PM

## 2015-05-16 NOTE — Progress Notes (Signed)
Patient ID: Kristen Marsh, female   DOB: 04-Sep-1955, 59 y.o.   MRN: [id number] [facility] MD Progress Note  05/16/2015 5:17 PM Kristen Marsh  MRN:  [id number] Subjective:    Patient  Reports improvement. She states " I feel much better". Today complains of ongoing pain, which she states responds well to Ibuprofen. Of note , Meloxicam is listed as an allergy, but patient states she takes Ibuprofen often at home, and denies having any side effects. She denies cravings. She is focusing more on disposition plans, and states that " my son wants me to go to a Rehab before I go back home. I would rather go back home , but  I guess I do need the treatment".   Objective : I have discussed case with treatment team and have met with patient. Compared to admission status patient is now much improved. This is noticeable in her gait, which is now normal and steady, in her affective range, which is now full in range, and in her improved relatedness - she is now going to groups, interacting with peers and staff . She states she still feels depressed, which she states is related to her pain, but affect at this time presents bright and reactive  . She is tolerating medications well, and at present denies side effects. No disruptive or agitated behaviors on unit.  She denies medication side effects.  Principal Problem: Alcohol Dependence, cocaine Dependence , Substance Induced Mood Disorder- Depressed  Diagnosis:   Patient Active Problem List   Diagnosis Date Noted  . Polysubstance abuse [F19.10] 05/07/2015  . Ascites due to alcoholic cirrhosis ([place]) [W97.94] 02/28/2015  . Ascites [R18.8] 02/28/2015  . GAD (generalized anxiety disorder) [F41.1] 10/11/2014  . IBS (irritable bowel syndrome) [K58.9] 10/11/2014  . COPD bronchitis [J44.9] 10/15/2013  . Alcoholism /alcohol abuse ([place]) [F10.20] 10/15/2013  . Cirrhosis of liver ([place]) [K74.60] 08/18/2013  . Degenerative cervical disc [M50.30] 08/18/2013  . Fibromyalgia  [M79.7] 08/18/2013  . Muscle cramps [R25.2] 08/18/2013  . GERD (gastroesophageal reflux disease) [K21.9] 08/18/2013   Total Time spent with patient: 20 minutes   Past Medical History:  Past Medical History  Diagnosis Date  . Osteoarthritis   . Hep C w/o coma, chronic ([place])   . Emphysema   . GERD (gastroesophageal reflux disease)   . IBS (irritable bowel syndrome)   . 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 MRSA carrier   . Fibromyalgia   . Shortness of breath     with exertion  . Hypertension   . Pneumonia     As a child  . Anxiety   . Kidney stones   . Depression   . Cirrhosis of liver ([place])   . Varicose vein     Of esophagus and top of stomach per pt    Past Surgical History  Procedure Laterality Date  . Hysteroscopy    . Appendectomy    . Abdominal hysterectomy    . Diagnostic laparoscopy      exploratory lap  . Colonoscopy w/ biopsies and polypectomy    . Direct laryngoscopy N/A 03/22/2014    Procedure: DIRECT LARYNGOSCOPY;  Surgeon: Ruby Cola, MD;  Location: [place];  Service: ENT;  Laterality: N/A;  With biopsy  . Esophagogastroduodenoscopy (egd) with propofol N/A 07/26/2014    Procedure: ESOPHAGOGASTRODUODENOSCOPY (EGD) WITH PROPOFOL;  Surgeon: Rogene Houston, MD;  Location: [facility];  Service: Endoscopy;  Laterality: N/A;  . Direct laryngoscopy N/A 01/15/2015    Procedure:  DIRECT LARYNGOSCOPY WITH BIOPSY;  Surgeon: Leta Baptist, MD;  Location: [place];  Service: ENT;  Laterality: N/A;   Family History:  Family History  Problem Relation Age of Onset  . Cancer Mother     esophageal  . Cancer Brother     Liver cancer and cirrhosis age 39   Social History:  History  Alcohol Use  . 2.4 oz/week  . 4 Cans of beer per week    Comment: 1 smirnoff a day.      History  Drug Use  . Yes  . Special: Cocaine, "Crack" cocaine    Comment: Haven't used cocaine in the last 3 years    Social History   Social History  . Marital Status: Divorced    Spouse  Name: N/A  . Number of Children: N/A  . Years of Education: N/A   Social History Main Topics  . Smoking status: Current Every Day Smoker -- 0.50 packs/day for 40 years    Types: Cigarettes  . Smokeless tobacco: Never Used     Comment: 1/2 pack a day at least 40 yrs  . Alcohol Use: 2.4 oz/week    4 Cans of beer per week     Comment: 1 smirnoff a day.   . Drug Use: Yes    Special: Cocaine, "Crack" cocaine     Comment: Haven't used cocaine in the last 3 years  . Sexual Activity: No   Other Topics Concern  . None   Social History Narrative   Additional Social History:   Sleep:   Good   Appetite:   Improving  Current Medications: Current Facility-Administered Medications  Medication Dose Route Frequency Provider Last Rate Last Dose  . albuterol (PROVENTIL HFA;VENTOLIN HFA) 108 (90 BASE) MCG/ACT inhaler 2 puff  2 puff Inhalation Q6H PRN Niel Hummer, NP   2 puff at 05/16/15 0854  . alum & mag hydroxide-simeth (MAALOX/MYLANTA) 200-200-20 MG/5ML suspension 30 mL  30 mL Oral Q4H PRN Niel Hummer, NP   30 mL at 05/16/15 1539  . betamethasone valerate lotion (VALISONE) 0.1 %   Topical BID Nicholaus Bloom, MD       And  . clotrimazole (LOTRIMIN) 1 % cream   Topical BID Nicholaus Bloom, MD      . cetirizine (ZYRTEC) tablet 10 mg  10 mg Oral Daily Oswald Hillock, MD   10 mg at 05/16/15 0813  . dicyclomine (BENTYL) tablet 20 mg  20 mg Oral TID WC PRN Oswald Hillock, MD   20 mg at 05/15/15 1517  . gabapentin (NEURONTIN) capsule 300 mg  300 mg Oral TID Jenne Campus, MD      . ibuprofen (ADVIL,MOTRIN) tablet 400 mg  400 mg Oral Q12H PRN Jenne Campus, MD   400 mg at 05/16/15 1539  . ipratropium-albuterol (DUONEB) 0.5-2.5 (3) MG/3ML nebulizer solution 3 mL  3 mL Nebulization Q6H PRN Laverle Hobby, PA-C   3 mL at 05/14/15 2059  . lactulose (CHRONULAC) 10 GM/15ML solution 20 g  20 g Oral TID Domenic Polite, MD   20 g at 05/16/15 1310  . levofloxacin (LEVAQUIN) tablet 250 mg  250 mg Oral Daily  Domenic Polite, MD   250 mg at 05/16/15 7824  . magnesium hydroxide (MILK OF MAGNESIA) suspension 30 mL  30 mL Oral Daily PRN Niel Hummer, NP      . multivitamin with minerals tablet 1 tablet  1 tablet Oral Daily Mickel Baas  Hiram Gash, NP   1 tablet at 05/16/15 0813  . pantoprazole (PROTONIX) EC tablet 40 mg  40 mg Oral Daily Oswald Hillock, MD   40 mg at 05/16/15 0813  . sertraline (ZOLOFT) tablet 100 mg  100 mg Oral Daily Jenne Campus, MD   100 mg at 05/16/15 7829  . thiamine (VITAMIN B-1) tablet 100 mg  100 mg Oral Daily Niel Hummer, NP   100 mg at 05/16/15 0813  . traZODone (DESYREL) tablet 50 mg  50 mg Oral QHS,MR X 1 Spencer E Simon, PA-C   50 mg at 05/15/15 2231    Lab Results:  No results found for this or any previous visit (from the past 48 hour(s)).  Physical Findings: AIMS: Facial and Oral Movements Muscles of Facial Expression: None, normal Lips and Perioral Area: None, normal Jaw: None, normal Tongue: None, normal,Extremity Movements Upper (arms, wrists, hands, fingers): Minimal Lower (legs, knees, ankles, toes): Minimal, Trunk Movements Neck, shoulders, hips: Minimal, Overall Severity Severity of abnormal movements (highest score from questions above): Minimal Incapacitation due to abnormal movements: Minimal Patient's awareness of abnormal movements (rate only patient's report): Aware, no distress, Dental Status Current problems with teeth and/or dentures?: Yes Does patient usually wear dentures?: No  CIWA:  CIWA-Ar Total: 1 COWS:  COWS Total Score: 2  Musculoskeletal: Strength & Muscle Tone: within normal limits Gait & Station: unsteady Patient leans: N/A  Psychiatric Specialty Exam: ROS- describes joint, knee pains, denies nausea, denies vomiting, denies chest pain, denies shortness of breath  Blood pressure 110/56, pulse 79, temperature 98.6 F (37 C), temperature source Oral, resp. rate 16, SpO2 97 %.There is no weight on file to calculate BMI.  General  Appearance:  Improved grooming   Eye Contact::   Good   Speech:  more verbal today- speaks in whisper- hard to understand at times   Volume:  Decreased  Mood:   Reports still feeling depressed, but acknowledges feeling much better overall   Affect:     Today bright and reactive, smiling and even laughing appropriately at times   Thought Process:  Linear  Orientation:  Full (Time, Place, and Person)   Thought Content:  denies hallucinations, no delusions expressed - does not appear internally preoccupied or paranoid   Suicidal Thoughts:  No today denies any thoughts of hurting self  Or of SI  Homicidal Thoughts:  No  Memory:  recent and remote fair   Judgement:  Other:  improving   Insight:  improving   Psychomotor Activity:   Improved, more visible on unit, improved gait   Concentration:  Fair  Recall:  Good  Fund of Knowledge:Good  Language: Fair  Akathisia:  Negative  Handed:  Right  AIMS (if indicated):     Assets:  Resilience  ADL's:  Impaired  Cognition: WNL  Sleep:  Number of Hours: 6.75  Assessment -  Ongoing improvement - at this time presents with improved mood, improved range of affect, and is much more communicative , interactive than she was on admission. Gait much improved as well. Describes chronic joint pain, and states this pain does perpetuate depression to some degree. Is hoping for Ibuprofen PRNs, which she states she takes at home with good result and no side effects. As discussed with patient and with staff, family wants patient to go to a Rehab before returning home and patient is agreeing to this disposition plan.   Treatment Plan Summary: Daily contact with patient to assess and evaluate  symptoms and progress in treatment, Medication management, Plan inpatient treatment  and medication management as below 1. Continue to encourage increased milieu, group participation to work on coping skills and symptom reduction 2. Continue   Zoloft   100  mgrs QDAY initially  to address  depression 3. Start Ibuprofen PRNs  400 mgrs BID PRN for pain as needed  4. Increase Neurontin  To   300   mgr  TID to address neuropathic pain.  5. On Levaquin for UTI 5. CSW/ treatment team working on disposition plans - residential rehab setting 6. Continue to encourage abstinence and recovery efforts . 7. Continue Trazodone 50 mgrs QHS PRN Insomnia , if needed  8. Continue Robaxin PRNs for severe pain , if needed  9. Patient is on Lactulose for history of liver cirrhosis and for constipation   COBOS, FERNANDO   MD  05/16/2015, 5:17 PM

## 2015-05-16 NOTE — Progress Notes (Addendum)
D:Patient in the dayroom on approach.  Patient states she had a good day.  Patient states her plan is to go to drug treatment when discharged.  Patient states, "I have not smoked in almost 2 weeks."  Patient states she has been up out of bed most of the day.  Patient states her goal for today was to have BM.  Patient had BM tonight.  Patient denies SI/HI and denies AVH.   A: Staff to monitor Q 15 mins for safety.  Encouragement and support offered.  Scheduled medications administered per orders. R: Patient remains safe on the unit.  Patient attended group tonight.  Patient visible on the unit and interacting with peers.  Patient taking administered medications.

## 2015-05-17 MED ORDER — ACAMPROSATE CALCIUM 333 MG PO TBEC
666.0000 mg | DELAYED_RELEASE_TABLET | Freq: Three times a day (TID) | ORAL | Status: DC
  Administered 2015-05-17 – 2015-05-18 (×3): 666 mg via ORAL
  Filled 2015-05-17 (×7): qty 2

## 2015-05-17 MED ORDER — SERTRALINE HCL 100 MG PO TABS
150.0000 mg | ORAL_TABLET | Freq: Every day | ORAL | Status: DC
  Administered 2015-05-18: 150 mg via ORAL
  Filled 2015-05-17 (×2): qty 1

## 2015-05-17 MED ORDER — HYDROXYZINE HCL 25 MG PO TABS
25.0000 mg | ORAL_TABLET | Freq: Three times a day (TID) | ORAL | Status: DC | PRN
  Administered 2015-05-17 – 2015-05-18 (×2): 25 mg via ORAL
  Filled 2015-05-17 (×2): qty 1

## 2015-05-17 NOTE — BHH Group Notes (Signed)
[place] LCSW Group Therapy 05/16/15  1:15 PM Type of Therapy: Group Therapy Participation Level: Active  Participation Quality: Attentive, Sharing and Supportive  Affect: Appropriate  Cognitive: Alert and Oriented  Insight: Developing/Improving and Engaged  Engagement in Therapy: Developing/Improving and Engaged  Modes of Intervention: Clarification, Confrontation, Discussion, Education, Exploration, Limit-setting, Orientation, Problem-solving, Rapport Building, Art therapist, Socialization and Support  Summary of Progress/Problems: The topic for group today was emotional regulation. This group focused on both positive and negative emotion identification and allowed group members to process ways to identify feelings, regulate negative emotions, and find healthy ways to manage internal/external emotions. Group members were asked to reflect on a time when their reaction to an emotion led to a negative outcome and explored how alternative responses using emotion regulation would have benefited them. Group members were also asked to discuss a time when emotion regulation was utilized when a negative emotion was experienced. Patient actively participated in discussion of emotional stressors and healthy coping skills. Patient identified spending time with family as a healthy coping skill and reports that she would like to exercise more. CSW and other group members provided patient with emotional support and encouragement.  Tilden Fossa, MSW, [place] Worker [facility] [phone]

## 2015-05-17 NOTE — Plan of Care (Signed)
Problem: Ineffective individual coping Goal: LTG: Patient will report a decrease in negative feelings Patient reporting a decrease in depression

## 2015-05-17 NOTE — Progress Notes (Signed)
D: Patient alert and oriented x4. Patient denies SI/HI/AVH. Patient stated "still feeling depressed, but just trying to be happy I'm alive." Patient talking very positively about discharge and substance treatment. Stated "I can't go home, til I receive help..I don't trust myself." Patient affect appears appropriate to her current situation, and mood appears anxious at times. Patient very insightful about substance abuse hx and goals for treatment.  Per patient inventory, depression rated as 5, hopelessness as 3, and anxiety as 7. Patient complained she was "feeling weird and anxious."  A: Provided active listening and support. Discussed patient status with MD and treatment team. Administered scheduled medications. Encouraged patient to use positive coping and relaxation techniques. Gave vistaril prn for anxiety.  R: Patient took hot shower "to help relax myself." Patient reported decreased anxiety post shower and vistaril. Will continue Q15 min. checks.

## 2015-05-17 NOTE — Clinical Social Work Note (Addendum)
Per Marshell Levan, patient has been admitted to [facility] for Friday 10/13 and will need to be there by 2pm. CSW left son Corene Cornea [phone] a voicemail to update, awaiting return call.   Tilden Fossa, MSW, [place] Worker [facility] [phone]

## 2015-05-17 NOTE — Plan of Care (Signed)
Problem: Alteration in mood & ability to function due to Goal: LTG-Pt reports reduction in suicidal thoughts (Patient reports reduction in suicidal thoughts and is able to verbalize a safety plan for whenever patient is feeling suicidal)  Outcome: Progressing Patient denies SI.  Patient verbally contracts for safety.

## 2015-05-17 NOTE — BHH Group Notes (Signed)
   [facility] LCSW Aftercare Discharge Planning Group Note  05/17/2015  8:45 AM   Participation Quality: Alert, Appropriate and Oriented  Mood/Affect: Depressed but Improving  Depression Rating: 6  Anxiety Rating: 6  Thoughts of Suicide: Pt denies SI/HI  Will you contract for safety? Yes  Current AVH: Pt denies  Plan for Discharge/Comments: Pt attended discharge planning group and actively participated in group. CSW provided pt with today's workbook. Patient continues to be interested in a residential treatment program at discharge.  Transportation Means: Pt reports access to transportation  Supports: No supports mentioned at this time  Tilden Fossa, MSW, [place] Social Worker [employer] [phone]

## 2015-05-17 NOTE — Clinical Social Work Note (Signed)
CSW met with patient's son Corene Cornea in the lobby to discuss patient's progress and discharge plans. CSW informed son that referrals are pending at [facility] and [facility]. Son is also in favor of ALF placement for patient if treatment options are unavailable. CSW agreeable to keep son updated on patient's progress. Son gave CSW $10 cash to give to patient for snacks.   CSW updated patient's belongings log and gave patient the money. CSW spoke with patient about possibility of ALF placement, she is agreeable for CSW to start ALF placement process while at hospital. Patient believes that she may be able to return home with family at discharge if needed.   CSW spoke with MD regarding disposition.   Tilden Fossa, MSW, [place] Worker [facility] [phone]

## 2015-05-17 NOTE — Clinical Social Work Note (Signed)
Patient declined at [facility] per [facility] due to acuity.   Tilden Fossa, MSW, [place] Worker [facility] [phone]

## 2015-05-17 NOTE — Progress Notes (Signed)
Patient ID: Kristen Marsh, female   DOB: January 17, 1956, 59 y.o.   MRN: [id number] [facility] MD Progress Note  05/17/2015 4:28 PM Kristen Marsh  MRN:  [id number] Subjective:    Reports  That in general she is feeling better, although still somewhat depressed . Denies medication side effects. Describes some ongoing cravings for alcohol in particular, although reports she feels very motivated in working on recovery. States " I know I could have easily died this last time ". States " I guess I went into a really bad relapse after I found out my exhusband had died ". States that although they had been divorced for many years , they remained good friends . States she has been communicating with her adult son, and that he has brought her clothes. She is hoping to eventually move in with him, but states that son wants her to go to rehab first , which she is in agreement with at this time.   Objective : I have discussed case with treatment team and have met with patient. At this time patient is visible on unit, has been going to groups, participating more . Gait improved, walking more steadily and without assistance .  Mood improved, although as noted , reports some lingering depression and anxiety. Her major concern at this time relates to cravings for alcohol in spite of desire to maintain abstinence . We discussed options and she expresses interest in Campral trial.    Principal Problem: Alcohol Dependence, cocaine Dependence , Substance Induced Mood Disorder- Depressed  Diagnosis:   Patient Active Problem List   Diagnosis Date Noted  . Polysubstance abuse [F19.10] 05/07/2015  . Ascites due to alcoholic cirrhosis ([place]) [G86.76] 02/28/2015  . Ascites [R18.8] 02/28/2015  . GAD (generalized anxiety disorder) [F41.1] 10/11/2014  . IBS (irritable bowel syndrome) [K58.9] 10/11/2014  . COPD bronchitis [J44.9] 10/15/2013  . Alcoholism /alcohol abuse ([place]) [F10.20] 10/15/2013  . Cirrhosis of liver ([place]) [K74.60]  08/18/2013  . Degenerative cervical disc [M50.30] 08/18/2013  . Fibromyalgia [M79.7] 08/18/2013  . Muscle cramps [R25.2] 08/18/2013  . GERD (gastroesophageal reflux disease) [K21.9] 08/18/2013   Total Time spent with patient: 20 minutes   Past Medical History:  Past Medical History  Diagnosis Date  . Osteoarthritis   . Hep C w/o coma, chronic ([place])   . Emphysema   . GERD (gastroesophageal reflux disease)   . IBS (irritable bowel syndrome)   . 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 MRSA carrier   . Fibromyalgia   . Shortness of breath     with exertion  . Hypertension   . Pneumonia     As a child  . Anxiety   . Kidney stones   . Depression   . Cirrhosis of liver ([place])   . Varicose vein     Of esophagus and top of stomach per pt    Past Surgical History  Procedure Laterality Date  . Hysteroscopy    . Appendectomy    . Abdominal hysterectomy    . Diagnostic laparoscopy      exploratory lap  . Colonoscopy w/ biopsies and polypectomy    . Direct laryngoscopy N/A 03/22/2014    Procedure: DIRECT LARYNGOSCOPY;  Surgeon: Ruby Cola, MD;  Location: [facility];  Service: ENT;  Laterality: N/A;  With biopsy  . Esophagogastroduodenoscopy (egd) with propofol N/A 07/26/2014    Procedure: ESOPHAGOGASTRODUODENOSCOPY (EGD) WITH PROPOFOL;  Surgeon: Rogene Houston, MD;  Location: [facility];  Service: Endoscopy;  Laterality:  N/A;  . Direct laryngoscopy N/A 01/15/2015    Procedure: DIRECT LARYNGOSCOPY WITH BIOPSY;  Surgeon: Leta Baptist, MD;  Location: [facility];  Service: ENT;  Laterality: N/A;   Family History:  Family History  Problem Relation Age of Onset  . Cancer Mother     esophageal  . Cancer Brother     Liver cancer and cirrhosis age 87   Social History:  History  Alcohol Use  . 2.4 oz/week  . 4 Cans of beer per week    Comment: 1 smirnoff a day.      History  Drug Use  . Yes  . Special: Cocaine, "Crack" cocaine    Comment: Haven't used cocaine in the last 3 years     Social History   Social History  . Marital Status: Divorced    Spouse Name: N/A  . Number of Children: N/A  . Years of Education: N/A   Social History Main Topics  . Smoking status: Current Every Day Smoker -- 0.50 packs/day for 40 years    Types: Cigarettes  . Smokeless tobacco: Never Used     Comment: 1/2 pack a day at least 40 yrs  . Alcohol Use: 2.4 oz/week    4 Cans of beer per week     Comment: 1 smirnoff a day.   . Drug Use: Yes    Special: Cocaine, "Crack" cocaine     Comment: Haven't used cocaine in the last 3 years  . Sexual Activity: No   Other Topics Concern  . None   Social History Narrative   Additional Social History:   Sleep:   Good   Appetite:   Improving  Current Medications: Current Facility-Administered Medications  Medication Dose Route Frequency Provider Last Rate Last Dose  . acamprosate (CAMPRAL) tablet 666 mg  666 mg Oral TID WC Jenne Campus, MD   666 mg at 05/17/15 1149  . albuterol (PROVENTIL HFA;VENTOLIN HFA) 108 (90 BASE) MCG/ACT inhaler 2 puff  2 puff Inhalation Q6H PRN Niel Hummer, NP   2 puff at 05/16/15 0854  . alum & mag hydroxide-simeth (MAALOX/MYLANTA) 200-200-20 MG/5ML suspension 30 mL  30 mL Oral Q4H PRN Niel Hummer, NP   30 mL at 05/16/15 1539  . betamethasone valerate lotion (VALISONE) 0.1 %   Topical BID Nicholaus Bloom, MD       And  . clotrimazole (LOTRIMIN) 1 % cream   Topical BID Nicholaus Bloom, MD      . cetirizine (ZYRTEC) tablet 10 mg  10 mg Oral Daily Oswald Hillock, MD   10 mg at 05/17/15 0825  . dicyclomine (BENTYL) tablet 20 mg  20 mg Oral TID WC PRN Oswald Hillock, MD   20 mg at 05/15/15 1517  . gabapentin (NEURONTIN) capsule 300 mg  300 mg Oral TID Jenne Campus, MD   300 mg at 05/17/15 1149  . ibuprofen (ADVIL,MOTRIN) tablet 400 mg  400 mg Oral Q12H PRN Jenne Campus, MD   400 mg at 05/17/15 0831  . ipratropium-albuterol (DUONEB) 0.5-2.5 (3) MG/3ML nebulizer solution 3 mL  3 mL Nebulization Q6H PRN Laverle Hobby, PA-C   3 mL at 05/16/15 2155  . lactulose (CHRONULAC) 10 GM/15ML solution 20 g  20 g Oral TID Domenic Polite, MD   20 g at 05/16/15 1723  . magnesium hydroxide (MILK OF MAGNESIA) suspension 30 mL  30 mL Oral Daily PRN Niel Hummer, NP      .  multivitamin with minerals tablet 1 tablet  1 tablet Oral Daily Niel Hummer, NP   1 tablet at 05/17/15 0825  . pantoprazole (PROTONIX) EC tablet 40 mg  40 mg Oral Daily Oswald Hillock, MD   40 mg at 05/17/15 0825  . [START ON 05/18/2015] sertraline (ZOLOFT) tablet 150 mg  150 mg Oral Daily Shariah Assad A Shana Zavaleta, MD      . thiamine (VITAMIN B-1) tablet 100 mg  100 mg Oral Daily Niel Hummer, NP   100 mg at 05/17/15 0825  . traZODone (DESYREL) tablet 50 mg  50 mg Oral QHS,MR X 1 Laverle Hobby, PA-C   50 mg at 05/16/15 2211    Lab Results:  No results found for this or any previous visit (from the past 48 hour(s)).  Physical Findings: AIMS: Facial and Oral Movements Muscles of Facial Expression: None, normal Lips and Perioral Area: None, normal Jaw: None, normal Tongue: None, normal,Extremity Movements Upper (arms, wrists, hands, fingers): Minimal Lower (legs, knees, ankles, toes): Minimal, Trunk Movements Neck, shoulders, hips: Minimal, Overall Severity Severity of abnormal movements (highest score from questions above): Minimal Incapacitation due to abnormal movements: Minimal Patient's awareness of abnormal movements (rate only patient's report): Aware, no distress, Dental Status Current problems with teeth and/or dentures?: Yes Does patient usually wear dentures?: No  CIWA:  CIWA-Ar Total: 1 COWS:  COWS Total Score: 2  Musculoskeletal: Strength & Muscle Tone: within normal limits Gait & Station: unsteady Patient leans: N/A  Psychiatric Specialty Exam: ROS- describes joint, knee pains, denies nausea, denies vomiting, denies chest pain, denies shortness of breath  Blood pressure 111/57, pulse 86, temperature 98.6 F (37 C),  temperature source Oral, resp. rate 20, SpO2 97 %.There is no weight on file to calculate BMI.  General Appearance:  Improved grooming   Eye Contact::   Good   Speech:  Normal Rate and speaks in whispers due to vocal cord dysfunction, but much more clearly than before  Volume:  Decreased  Mood:    Improved mood, less depression  Affect:     Has improved, at this time more reactive, smiling at times appropriately  Thought Process:  Linear  Orientation:  Full (Time, Place, and Person)   Thought Content:  denies hallucinations, no delusions expressed - does not appear internally preoccupied or paranoid   Suicidal Thoughts:  No today denies any thoughts of hurting self  Or of SI  Homicidal Thoughts:  No  Memory:  recent and remote fair   Judgement:  Other:  improving   Insight:  improving   Psychomotor Activity:   Improved, more visible on unit, improved gait   Concentration:  Fair  Recall:  Good  Fund of Knowledge:Good  Language: Fair  Akathisia:  Negative  Handed:  Right  AIMS (if indicated):     Assets:  Resilience  ADL's:  Impaired  Cognition: WNL  Sleep:  Number of Hours: 6.75  Assessment - Continues to improve gradually but very significantly compared to admission- improved mood, improved range of affect, less depressive symptoms, more visible on unit. Concerned about ongoing cravings for alcohol in spite of feeling strongly about her recovery. Interested in [place] trial.  No medication side effects at this time. Patient working on disposition options - as noted family wants her to go to a Rehab before returning home and she is in  agreement .  Treatment Plan Summary: Daily contact with patient to assess and evaluate symptoms and progress in treatment, Medication management,  Plan inpatient treatment  and medication management as below 1. Continue to encourage increased milieu, group participation to work on coping skills and symptom reduction 2. Increase  Zoloft   To 150  mgrs  QDAY  to address  Lingering  depression 3. Start Campral 666 mgrs  TID to address alcohol cravings 4. Continue  Neurontin   300   mgr  TID to address neuropathic pain.  5. On Levaquin for UTI 5. CSW/ treatment team working on disposition plans - residential rehab setting 6. Continue to encourage abstinence and recovery efforts . 7. Continue Trazodone 50 mgrs QHS PRN Insomnia , if needed  8. Continue Robaxin PRNs for severe pain , if needed  9. Patient is on Lactulose for history of liver cirrhosis and for constipation   Kaylon Hitz   MD  05/17/2015, 4:28 PM

## 2015-05-17 NOTE — Tx Team (Addendum)
Interdisciplinary Treatment Plan Update (Adult) Date: 05/17/2015   Date: 05/17/2015 8:46 AM  Progress in Treatment:  Attending groups: Minimally Participating in groups: Minimally Taking medication as prescribed: Yes  Tolerating medication: Yes  Family/Significant othe contact made: Yes, CSW has spoken with patient's son Patient understands diagnosis: Yes Discussing patient identified problems/goals with staff: Yes  Medical problems stabilized or resolved: Yes  Denies suicidal/homicidal ideation: Yes Patient has not harmed self or Others: Yes   New problem(s) identified: None identified at this time.   Discharge Plan or Barriers:  10/6: Pt plans to discharge to her son's home; CSW assessing for appropriate aftercare 10/10: Patient expresses interest in a residential substance abuse treatment center. 10/12: Patient and son requesting residential treatment at discharge. Referrals pending at [facility] and [facility]  Additional comments: n/a   Reason for Continuation of Hospitalization:  Depression Medication stabilization Withdrawal symptoms  Estimated length of stay: 1-2 days  Review of initial/current patient goals per problem list:   1.  Goal(s): Patient will participate in aftercare plan  Met:  Yes  Target date: 3-5 days from date of admission   As evidenced by: Patient will participate within aftercare plan AEB aftercare provider and housing plan at discharge being identified.   05/10/15: Pt plans to discharge to her son's house; CSW assessing for appropriate referrals    10/10: Patient expresses interest in a residential substance abuse treatment center.    10/12: 10/12: Patient and son requesting residential treatment at discharge. Referrals pending at [facility] and [facility]    10/13: Goal met: Patient to discharge to [facility] for continued treatment.   2.  Goal (s): Patient will exhibit decreased depressive symptoms and suicidal ideations.  Met:  Adequate for discharge per  MD  Target date: 3-5 days from date of admission   As evidenced by: Patient will utilize self rating of depression at 3 or below and demonstrate decreased signs of depression or be deemed stable for discharge by MD.  05/10/15: Pt reports feeling "kind of" depression. Continuing to assess  10/10: Goal progressing. Patient continues to isolate from milieu and does not participate in therapeutic activities.  10/12: Goal progressing. Patient rates depression at a 6.   10/13: Adequate for discharge per MD. Patient rates depression at a 6, denies SI.   4.  Goal(s): Patient will demonstrate decreased signs of withdrawal due to substance abuse  Met:  Yes  Target date: 3-5 days from date of admission   As evidenced by: Patient will produce a CIWA/COWS score of 0, have stable vitals signs, and no symptoms of withdrawal  05/10/15: Pt denies withdrawal symptoms and CIWA score of 0.   Attendees: Patient:    Family:    Physician: Dr. Parke Poisson; Dr. Sabra Heck 05/17/2015 9:30 AM  Nursing: Festus Aloe, Loletta Specter, Hedy Jacob, RN 05/17/2015 9:30 AM  Clinical Social Worker: Tilden Fossa,  [place] 05/17/2015 9:30 AM  Other: Maxie Better, Peri Maris LCSWA  05/17/2015 9:30 AM  Other: Lucinda Dell, [place] Sessions Liaison 05/17/2015 9:30 AM  Other: Lars Pinks, CM 05/17/2015 9:30 AM  Other:   05/17/2015 9:30 AM  Other:       Tilden Fossa, MSW, [place] Worker [facility] [phone]

## 2015-05-17 NOTE — [facility] Group Notes (Signed)
[facility] Group Notes:  (Nursing/MHT/Case Management/Adjunct)  Date:  05/17/2015  Time:  1000 Type of Therapy:  Nurse Education  Participation Level:  Active  Participation Quality:  Appropriate and Attentive  Affect:  Appropriate  Cognitive:  Alert and Appropriate  Insight:  Appropriate and Good  Engagement in Group:  Engaged  Modes of Intervention:  Discussion, Education and Exploration  Summary of Progress/Problems: Group topic was on leisure and lifestyle changes.  Discussed the importance of engaging in a positive leisure activities.  Also discussed on power of positive thinking which helps to boost self esteem. Mart Piggs 05/17/2015, 12:44 PM

## 2015-05-17 NOTE — [facility] Group Notes (Signed)
[facility] Mental [employer] Association Group Therapy 05/17/2015 1:15pm  Type of Therapy: Mental Health Association Presentation  Participation Level: Active  Participation Quality: Attentive  Affect: Appropriate  Cognitive: Oriented  Insight: Developing/Improving  Engagement in Therapy: Engaged  Modes of Intervention: Discussion, Education and Socialization  Summary of Progress/Problems: Mental Health Association ([place]) Speaker came to talk about his personal journey with substance abuse and addiction. The pt processed ways by which to relate to the speaker. [place] speaker provided handouts and educational information pertaining to groups and services offered by the [facility]. Pt was engaged in speaker's presentation and was receptive to resources provided.    Peri Maris, [place] 05/17/2015 2:01 PM

## 2015-05-18 DIAGNOSIS — F1024 Alcohol dependence with alcohol-induced mood disorder: Secondary | ICD-10-CM | POA: Insufficient documentation

## 2015-05-18 DIAGNOSIS — F1424 Cocaine dependence with cocaine-induced mood disorder: Secondary | ICD-10-CM | POA: Insufficient documentation

## 2015-05-18 DIAGNOSIS — F332 Major depressive disorder, recurrent severe without psychotic features: Secondary | ICD-10-CM | POA: Diagnosis present

## 2015-05-18 MED ORDER — GABAPENTIN 300 MG PO CAPS: 300.0000 mg | ORAL_CAPSULE | Freq: Three times a day (TID) | ORAL | Status: DC

## 2015-05-18 MED ORDER — CETIRIZINE HCL 10 MG PO TABS: 10.0000 mg | ORAL_TABLET | Freq: Every day | ORAL | Status: DC

## 2015-05-18 MED ORDER — OMEPRAZOLE 20 MG PO CPDR: 20.0000 mg | DELAYED_RELEASE_CAPSULE | Freq: Two times a day (BID) | ORAL | Status: DC

## 2015-05-18 MED ORDER — HYDROXYZINE HCL 25 MG PO TABS: 25.0000 mg | ORAL_TABLET | Freq: Three times a day (TID) | ORAL | Status: DC | PRN

## 2015-05-18 MED ORDER — TRAZODONE HCL 50 MG PO TABS: 50.0000 mg | ORAL_TABLET | Freq: Every evening | ORAL | Status: DC | PRN

## 2015-05-18 MED ORDER — BETAMETHASONE VALERATE 0.1 % EX LOTN: TOPICAL_LOTION | Freq: Two times a day (BID) | CUTANEOUS | Status: DC

## 2015-05-18 MED ORDER — ACAMPROSATE CALCIUM 333 MG PO TBEC: 666.0000 mg | DELAYED_RELEASE_TABLET | Freq: Three times a day (TID) | ORAL | Status: DC

## 2015-05-18 MED ORDER — SERTRALINE HCL 50 MG PO TABS: 150.0000 mg | ORAL_TABLET | Freq: Every day | ORAL | Status: DC

## 2015-05-18 MED ORDER — LACTULOSE 10 GM/15ML PO SOLN: ORAL | Status: DC

## 2015-05-18 MED ORDER — DICYCLOMINE HCL 20 MG PO TABS: 20.0000 mg | ORAL_TABLET | Freq: Four times a day (QID) | ORAL | Status: DC

## 2015-05-18 NOTE — Clinical Social Work Note (Signed)
CSW has attempted to contact son Corene Cornea to give update but no answer.   CSW updated patient on plan to discharge to [place] with taxi voucher approved by [facility]. Patient verbalized her agreement and appreciation.   Tilden Fossa, MSW, [place] Worker [facility] [phone]

## 2015-05-18 NOTE — Progress Notes (Signed)
D: Pt has anxious affect and mood.  She describes her day as "different."  Pt reports she is discharging tomorrow and "going to 2 week care."  She reports she is relieved to be discharging to a treatment facility.  Pt denies SI/HI, denies hallucinations, reports back pain of 7/10.  Pt has been visible in milieu interacting with peers and staff appropriately.  Pt attended evening karaoke group.   A: Introduced self to pt.  Met with pt 1:1 and provided support and encouragement.  Actively listened to pt.  Pt received duoneb nebulizer treatment for shortness of breath.  Medications administered per order.  PRN medication administered for pain.  Heat packs provided for pain.   R: Pt is compliant with medications.  Pt verbally contracts for safety.  Will continue to monitor and assess.

## 2015-05-18 NOTE — Discharge Summary (Signed)
Physician Discharge Summary Note  Patient:  Kristen Marsh is an 59 y.o., female MRN:  [id number] DOB:  1956/06/08 Patient phone:  [phone] (home)  Patient address:   [address] [place] [address],  Total Time spent with patient: 45 minutes  Date of Admission:  05/07/2015 Date of Discharge: 05/18/15  Reason for Admission:   History of Present Illness:: Patient is a 59 year old female. She is a fair historian, currently drowsy, although easily alertable by calling her name, and communication is further difficulted by hoarseness/ low volume of speech, which she attributes to vocal cord dysfunction ( chronic )  States she has been drinking And using crack cocaine regularly " for a little while". As per chart notes, patient was brought to hospital by family, due to concerns about her safety and making suicidal statements of wanting to die and being unable to continue living . She had reportedly not slept for several days prior to admission, and presented very agitated, restless, needing sedation in ER. Patient states she has been sad, depressed. Chart notes indicate patient reported depression related to relapse, inability to stop using drugs.  Patient unable to quantify how much she was drinking, but states " a lot ". As per chart notes , patient was drinking 2 spiked lemonade type drinks daily, and was using about $ 100 dollars worth of crack cocaine daily. At this time does not endorse severe withdrawal symptoms , and does not appear to be in any acute distress or discomfort. Mild psychomotor restlessness, but no overt agitation, no distal tremors, no diaphoresis. She states, regarding withdrawal symptoms " I think yesterday was worse ". Of note , Admission BAL 9, UDS positive for amphetamines and for cocaine, negative of BZDs or opiates.   Principal Problem: <principal problem not specified> Discharge Diagnoses: Patient Active Problem List   Diagnosis Date Noted  . Polysubstance  abuse [F19.10] 05/07/2015  . Ascites due to alcoholic cirrhosis ([place]) [B70.48] 02/28/2015  . Ascites [R18.8] 02/28/2015  . GAD (generalized anxiety disorder) [F41.1] 10/11/2014  . IBS (irritable bowel syndrome) [K58.9] 10/11/2014  . COPD bronchitis [J44.9] 10/15/2013  . Alcoholism /alcohol abuse ([place]) [F10.20] 10/15/2013  . Cirrhosis of liver ([place]) [K74.60] 08/18/2013  . Degenerative cervical disc [M50.30] 08/18/2013  . Fibromyalgia [M79.7] 08/18/2013  . Muscle cramps [R25.2] 08/18/2013  . GERD (gastroesophageal reflux disease) [K21.9] 08/18/2013    Musculoskeletal: Strength & Muscle Tone: within normal limits Gait & Station: normal Patient leans: N/A  Psychiatric Specialty Exam: Physical Exam  Review of Systems  Psychiatric/Behavioral: Positive for depression and substance abuse. The patient is nervous/anxious and has insomnia.   All other systems reviewed and are negative.   Blood pressure 109/52, pulse 87, temperature 98.4 F (36.9 C), temperature source Oral, resp. rate 16, SpO2 97 %.There is no weight on file to calculate BMI.  SEE MD PSE within the SRA   Have you used any form of tobacco in the last 30 days? (Cigarettes, Smokeless Tobacco, Cigars, and/or Pipes): Yes  Has this patient used any form of tobacco in the last 30 days? (Cigarettes, Smokeless Tobacco, Cigars, and/or Pipes) No  Past Medical History:  Past Medical History  Diagnosis Date  . Osteoarthritis   . Hep C w/o coma, chronic ([name])   . Emphysema   . GERD (gastroesophageal reflux disease)   . IBS (irritable bowel syndrome)   . 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 MRSA carrier   . Fibromyalgia   . Shortness  of breath     with exertion  . Hypertension   . Pneumonia     As a child  . Anxiety   . Kidney stones   . Depression   . Cirrhosis of liver ([name])   . Varicose vein     Of esophagus and top of stomach per pt    Past Surgical History  Procedure Laterality Date  . Hysteroscopy    . Appendectomy     . Abdominal hysterectomy    . Diagnostic laparoscopy      exploratory lap  . Colonoscopy w/ biopsies and polypectomy    . Direct laryngoscopy N/A 03/22/2014    Procedure: DIRECT LARYNGOSCOPY;  Surgeon: Ruby Cola, MD;  Location: [facility];  Service: ENT;  Laterality: N/A;  With biopsy  . Esophagogastroduodenoscopy (egd) with propofol N/A 07/26/2014    Procedure: ESOPHAGOGASTRODUODENOSCOPY (EGD) WITH PROPOFOL;  Surgeon: Rogene Houston, MD;  Location: [facility];  Service: Endoscopy;  Laterality: N/A;  . Direct laryngoscopy N/A 01/15/2015    Procedure: DIRECT LARYNGOSCOPY WITH BIOPSY;  Surgeon: Leta Baptist, MD;  Location: [facility];  Service: ENT;  Laterality: N/A;   Family History:  Family History  Problem Relation Age of Onset  . Cancer Mother     esophageal  . Cancer Brother     Liver cancer and cirrhosis age 10   Social History:  History  Alcohol Use  . 2.4 oz/week  . 4 Cans of beer per week    Comment: 1 smirnoff a day.      History  Drug Use  . Yes  . Special: Cocaine, "Crack" cocaine    Comment: Haven't used cocaine in the last 3 years    Social History   Social History  . Marital Status: Divorced    Spouse Name: N/A  . Number of Children: N/A  . Years of Education: N/A   Social History Main Topics  . Smoking status: Current Every Day Smoker -- 0.50 packs/day for 40 years    Types: Cigarettes  . Smokeless tobacco: Never Used     Comment: 1/2 pack a day at least 40 yrs  . Alcohol Use: 2.4 oz/week    4 Cans of beer per week     Comment: 1 smirnoff a day.   . Drug Use: Yes    Special: Cocaine, "Crack" cocaine     Comment: Haven't used cocaine in the last 3 years  . Sexual Activity: No   Other Topics Concern  . None   Social History Narrative    Risk to Self: Is patient at risk for suicide?: No What has been your use of drugs/alcohol within the last 12 months?: 3 "tall" Smirnoff's daily; abuses Adderall and crack cocaine Risk to Others:   Prior  Inpatient Therapy:   Prior Outpatient Therapy:    Level of Care:  OP  Hospital Course:   Aarvi Stotts was admitted for MDD (major depressive disorder), recurrent severe, without psychosis ([place]) , with psychosis and crisis management.  Pt was treated discharged with the medications listed below under Medication List.  Medical problems were identified and treated as needed.  Home medications were restarted as appropriate.   Improvement was monitored by observation and Gwenyth Ober 's daily report of symptom reduction.  Emotional and mental status was monitored by daily self-inventory reports completed by Gwenyth Ober and clinical staff.        Tiye Huwe was evaluated by the treatment team for stability and plans  for continued recovery upon discharge. Lakeyn Dokken 's motivation was an integral factor for scheduling further treatment. Employment, transportation, bed availability, health status, family support, and any pending legal issues were also considered during hospital stay. Pt was offered further treatment options upon discharge including but not limited to Residential, Intensive Outpatient, and Outpatient treatment.  Tranesha Lessner will follow up with the services as listed below under Follow Up Information.     Upon completion of this admission the patient was both mentally and medically stable for discharge denying suicidal/homicidal ideation, auditory/visual/tactile hallucinations, delusional thoughts and paranoia.   Consults:  None  Significant Diagnostic Studies:  UA probable for UTI, treated, asymptomatic at this time; UDS + for cocaine and amphetamines; Ammonia 62 (H);   Discharge Vitals:   Blood pressure 109/52, pulse 87, temperature 98.4 F (36.9 C), temperature source Oral, resp. rate 16, SpO2 97 %. There is no weight on file to calculate BMI. Lab Results:   No results found for this or any previous visit (from the past 72 hour(s)).  Physical Findings: AIMS: Facial and Oral  Movements Muscles of Facial Expression: None, normal Lips and Perioral Area: None, normal Jaw: None, normal Tongue: None, normal,Extremity Movements Upper (arms, wrists, hands, fingers): Minimal Lower (legs, knees, ankles, toes): Minimal, Trunk Movements Neck, shoulders, hips: Minimal, Overall Severity Severity of abnormal movements (highest score from questions above): Minimal Incapacitation due to abnormal movements: Minimal Patient's awareness of abnormal movements (rate only patient's report): Aware, no distress, Dental Status Current problems with teeth and/or dentures?: Yes Does patient usually wear dentures?: No  CIWA:  CIWA-Ar Total: 1 COWS:  COWS Total Score: 2   See Psychiatric Specialty Exam and Suicide Risk Assessment completed by Attending Physician prior to discharge.  Discharge destination:  Home  Is patient on multiple antipsychotic therapies at discharge:  No   Has Patient had three or more failed trials of antipsychotic monotherapy by history:  No    Recommended Plan for Multiple Antipsychotic Therapies: NA     Medication List    STOP taking these medications        furosemide 40 MG tablet  Commonly known as:  LASIX      TAKE these medications      Indication   acamprosate 333 MG tablet  Commonly known as:  CAMPRAL  Take 2 tablets (666 mg total) by mouth 3 (three) times daily with meals.   Indication:  Excessive Use of Alcohol     albuterol (2.5 MG/3ML) 0.083% nebulizer solution  Commonly known as:  PROVENTIL  Take 3 mLs (2.5 mg total) by nebulization every 6 (six) hours as needed for wheezing or shortness of breath.      albuterol 108 (90 BASE) MCG/ACT inhaler  Commonly known as:  PROVENTIL HFA  INHALE 2 PUFFS INTO THE LUNGS EVERY FOUR HOURS AS NEEDED FOR WHEEZING.      betamethasone valerate lotion 0.1 %  Commonly known as:  VALISONE  Apply topically 2 (two) times daily. To affected areas   Indication:  Itching     cetirizine 10 MG tablet   Commonly known as:  ZYRTEC ALLERGY  Take 1 tablet (10 mg total) by mouth daily.   Indication:  Hayfever     dicyclomine 20 MG tablet  Commonly known as:  BENTYL  Take 1 tablet (20 mg total) by mouth every 6 (six) hours.   Indication:  gastric upset     gabapentin 300 MG capsule  Commonly known as:  NEURONTIN  Take 1 capsule (300 mg total) by mouth 3 (three) times daily.   Indication:  mood stabilization     hydrOXYzine 25 MG tablet  Commonly known as:  ATARAX/VISTARIL  Take 1 tablet (25 mg total) by mouth every 8 (eight) hours as needed (Severe anxiety).   Indication:  Anxiety Neurosis     lactulose 10 GM/15ML solution  Commonly known as:  CHRONULAC  TAKE 15MLs BY MOUTH TWICE DAILY, TO BE MANAGED BY OUTPATIENT PROVIDER   Indication:  ammonia balance; managed outpatient     omeprazole 20 MG capsule  Commonly known as:  PRILOSEC  Take 1 capsule (20 mg total) by mouth 2 (two) times daily.   Indication:  Gastroesophageal Reflux Disease     sertraline 50 MG tablet  Commonly known as:  ZOLOFT  Take 3 tablets (150 mg total) by mouth daily.   Indication:  Major Depressive Disorder     traZODone 50 MG tablet  Commonly known as:  DESYREL  Take 1 tablet (50 mg total) by mouth at bedtime as needed for sleep.   Indication:  Trouble Sleeping        Follow-up recommendations:  Activity:  As tolerated Diet:  Heart healthy with low sodium  Comments:   Take all medications as prescribed. Keep all follow-up appointments as scheduled.  Do not consume alcohol or use illegal drugs while on prescription medications. Report any adverse effects from your medications to your primary care provider promptly.  In the event of recurrent symptoms or worsening symptoms, call 911, a crisis hotline, or go to the nearest emergency department for evaluation.   Total Discharge Time:  Greater than 30 minutes  Signed: Benjamine Mola, FNP-BC 05/18/2015, 10:16 AM  Patient seen, Suicide Assessment  Completed.  Disposition Plan Reviewed

## 2015-05-18 NOTE — Progress Notes (Addendum)
Discharge note:  Patient discharged per MD order.  Patient received all personal belongings.  Discharge instructions reviewed - medication administration and follow up information.  She indicated understanding.   Prescriptions were given. Patient denies SI/HI/AVH.  [employer] is transporting patient to [facility].  Patient left ambulatory without incident. Addendum:  Patient's prescription for campral was left behind.  Contacted [facility] and awaiting call back.  Will call prescription into patient's pharmacy per [facility].

## 2015-05-18 NOTE — Plan of Care (Signed)
Problem: Diagnosis: Increased Risk For Suicide Attempt Goal: STG-Patient Will Attend All Groups On The Unit Outcome: Progressing Pt attended evening group on 05/17/15.     

## 2015-05-18 NOTE — BHH Suicide Risk Assessment (Signed)
[facility] Discharge Suicide Risk Assessment   Demographic Factors:  59 year old divorced female, lives with son  Total Time spent with patient: 30 minutes  Musculoskeletal: Strength & Muscle Tone: within normal limits Gait & Station: normal Patient leans: N/A  Psychiatric Specialty Exam: Physical Exam  ROS  Blood pressure 109/52, pulse 87, temperature 98.4 F (36.9 C), temperature source Oral, resp. rate 16, SpO2 97 %.There is no weight on file to calculate BMI.  General Appearance: Well Groomed  Eye Contact::  Good  Speech:  Normal Rate409  Volume:  Decreased- speaks in whisper due to history of vocal cord dysfunction  Mood:  Euthymic  Affect:  Appropriate and Full Range  Thought Process:  Linear  Orientation:  Full (Time, Place, and Person)  Thought Content:  denies hallucinations, no delusions  , not internally preoccupied   Suicidal Thoughts:  No  Homicidal Thoughts:  No  Memory:  recent and remote grossly intact   Judgement:  Other:  improved   Insight:  Present  Psychomotor Activity:  Normal  Concentration:  Good  Recall:  Good  Fund of Knowledge:Good  Language: Good  Akathisia:  Negative  Handed:  Right  AIMS (if indicated):     Assets:  Communication Skills Desire for Improvement Resilience  Sleep:  Number of Hours: 5.75  Cognition: WNL  ADL's:  Intact   Have you used any form of tobacco in the last 30 days? (Cigarettes, Smokeless Tobacco, Cigars, and/or Pipes): Yes  Has this patient used any form of tobacco in the last 30 days? (Cigarettes, Smokeless Tobacco, Cigars, and/or Pipes)  We reviewed significant health benefits of smoking cessation today- patient aware and states she intends to stop smoking . Offered  Prescription for nicoderm patches or gum , but declined.   Mental Status Per Nursing Assessment::   On Admission:     Current Mental Status by Physician: At this time patient is alert and attentive, well related, pleasant, euthymic, with full range of  affect, no thought disorder, no SI, no HI, no psychotic symptoms, future oriented .   Loss Factors: Drug/alcohol dependence- relapse. Recent death of her ex husband. History of vocal cord dysfunction x 2 years, which has affected her ability to speak normally  Historical Factors: Long history of alcohol and cocaine dependencies, history of depression  Risk Reduction Factors:   Sense of responsibility to family, Living with another person, especially a relative and Positive coping skills or problem solving skills  Continued Clinical Symptoms:  As above, much improved compared to admission presentation.  Cognitive Features That Contribute To Risk:  No gross cognitive deficits noted upon discharge. Is alert , attentive, and oriented x 3   Suicide Risk:  Mild:  Suicidal ideation of limited frequency, intensity, duration, and specificity.  There are no identifiable plans, no associated intent, mild dysphoria and related symptoms, good self-control (both objective and subjective assessment), few other risk factors, and identifiable protective factors, including available and accessible social support.  Principal Problem: MDD (major depressive disorder), recurrent severe, without psychosis [facility]) Discharge Diagnoses:  Patient Active Problem List   Diagnosis Date Noted  . MDD (major depressive disorder), recurrent severe, without psychosis ([place]) [F33.2] 05/18/2015  . Polysubstance abuse [F19.10] 05/07/2015  . Ascites due to alcoholic cirrhosis ([place]) [E70.35] 02/28/2015  . Ascites [R18.8] 02/28/2015  . GAD (generalized anxiety disorder) [F41.1] 10/11/2014  . IBS (irritable bowel syndrome) [K58.9] 10/11/2014  . COPD bronchitis [J44.9] 10/15/2013  . Alcoholism /alcohol abuse ([place]) [F10.20] 10/15/2013  .  Cirrhosis of liver ([place]) [K74.60] 08/18/2013  . Degenerative cervical disc [M50.30] 08/18/2013  . Fibromyalgia [M79.7] 08/18/2013  . Muscle cramps [R25.2] 08/18/2013  . GERD (gastroesophageal  reflux disease) [K21.9] 08/18/2013    Follow-up Information    Follow up with [facility] On 05/18/2015.   Why:  Please arrive by 2pm for admission for continued treatment.    Contact information:   [place]  [phone]      Plan Of Care/Follow-up recommendations:  Activity:  as tolerated  Diet:  Regular Tests:  NA Other:  see below   Is patient on multiple antipsychotic therapies at discharge:  No   Has Patient had three or more failed trials of antipsychotic monotherapy by history:  No  Recommended Plan for Multiple Antipsychotic Therapies: NA Patient is leaving in good spirits - going to [facility] for ongoing treatment /focus on recovery, after which she plans to return to live with her son. She has an established PCP , Dr.Hawke , who she sees for management of her chronic medical illnesses, to include cirrhosis .  She also has an established ENT Specialist , Dr. Tyrone Apple ( unsure of spellling ) who she follows up with for management of vocal cord dysfunction   COBOS, FERNANDO 05/18/2015, 10:30 AM

## 2015-05-18 NOTE — BHH Suicide Risk Assessment (Signed)
[place] INPATIENT:  Family/Significant Other Suicide Prevention Education  Suicide Prevention Education:  Patient Discharged to [facility]:  Suicide Prevention Education Not Provided: {PT. DISCHARGED TO OTHER HEALTHCARE FACILITY:SUICIDE PREVENTION EDUCATION NOT PROVIDED ([facility]):  The patient is discharging to another healthcare facility for continuation of treatment.  The patient's medical information, including suicide ideations and risk factors, are a part of the medical information shared with the receiving healthcare facility. SPE reviewed with patient and brochure provided. Patient encouraged to return to hospital if having suicidal thoughts, patient verbalized his/her understanding and has no further questions at this time.   Emonte Dieujuste, Casimiro Needle 05/18/2015, 10:22 AM

## 2015-05-18 NOTE — Clinical Social Work Note (Signed)
CSW updated son Corene Cornea [phone] on patient's transfer to [facility].   Tilden Fossa, MSW, [place] Worker [facility] [phone]

## 2015-05-18 NOTE — Clinical Social Work Note (Addendum)
Patient accepted to [facility] for today 10/14 but will have to be there by 2pm.  CSW contacted son Corene Cornea at 8 am to discuss transportation. Son reports that he is working today and cannot provide transportation and denies having any other family that will be able to assist. Patient also denies any other transportation options other than son. CSW informed son that if patient is unable to get transportation to treatment center, that patient may be stable to discharge home with family today. Son agreeable to contact CSW back later this morning.   Tilden Fossa, MSW, [place] Worker [facility] [phone]

## 2015-05-18 NOTE — BHH Group Notes (Signed)
   [facility] LCSW Aftercare Discharge Planning Group Note  05/18/2015  8:45 AM   Participation Quality: Alert, Appropriate and Oriented  Mood/Affect: Anxious  Depression Rating: 6  Anxiety Rating: 6  Thoughts of Suicide: Pt denies SI/HI  Will you contract for safety? Yes  Current AVH: Pt denies  Plan for Discharge/Comments: Pt attended discharge planning group and actively participated in group. CSW provided pt with today's workbook. Patient has been accepted to [facility] today but is unsure if son will be able to transport.   Transportation Means: Pt reports access to transportation  Supports: No supports mentioned at this time  Tilden Fossa, MSW, [place] Social Worker [employer] [phone]

## 2015-05-18 NOTE — Progress Notes (Signed)
  [facility] Adult Case Management Discharge Plan :  Will you be returning to the same living situation after discharge:  No, patient will discharge to [facility]  At discharge, do you have transportation home?: Yes,  patient will be provided with taxi voucher Do you have the ability to pay for your medications: Yes,  patient will be provided with prescriptions at discharge  Release of information consent forms completed and in the chart;  Patient's signature needed at discharge.  Patient to Follow up at: Follow-up Information    Follow up with [facility] On 05/18/2015.   Why:  Please arrive by 2pm for admission for continued treatment.    Contact information:   [place]  [phone]      Patient denies SI/HI: Yes,  denies    Safety Planning and Suicide Prevention discussed: Yes,  with patient  Have you used any form of tobacco in the last 30 days? (Cigarettes, Smokeless Tobacco, Cigars, and/or Pipes): Yes  Has patient been referred to the Quitline?: Patient refused referral  Arnol Mcgibbon, Casimiro Needle 05/18/2015, 10:24 AM

## 2015-07-06 ENCOUNTER — Ambulatory Visit (INDEPENDENT_AMBULATORY_CARE_PROVIDER_SITE_OTHER): Payer: Medicaid Other | Admitting: Family

## 2015-07-06 ENCOUNTER — Encounter: Payer: Self-pay | Admitting: Family

## 2015-07-06 VITALS — BP 117/73 | HR 104 | Temp 97.6°F | Ht 62.0 in | Wt 134.2 lb

## 2015-07-06 DIAGNOSIS — F1020 Alcohol dependence, uncomplicated: Secondary | ICD-10-CM | POA: Diagnosis not present

## 2015-07-06 DIAGNOSIS — H269 Unspecified cataract: Secondary | ICD-10-CM | POA: Diagnosis not present

## 2015-07-06 DIAGNOSIS — G4700 Insomnia, unspecified: Secondary | ICD-10-CM | POA: Diagnosis not present

## 2015-07-06 DIAGNOSIS — J449 Chronic obstructive pulmonary disease, unspecified: Secondary | ICD-10-CM | POA: Diagnosis not present

## 2015-07-06 DIAGNOSIS — K219 Gastro-esophageal reflux disease without esophagitis: Secondary | ICD-10-CM

## 2015-07-06 DIAGNOSIS — M797 Fibromyalgia: Secondary | ICD-10-CM

## 2015-07-06 DIAGNOSIS — K589 Irritable bowel syndrome without diarrhea: Secondary | ICD-10-CM

## 2015-07-06 DIAGNOSIS — K7469 Other cirrhosis of liver: Secondary | ICD-10-CM

## 2015-07-06 DIAGNOSIS — IMO0001 Reserved for inherently not codable concepts without codable children: Secondary | ICD-10-CM

## 2015-07-06 MED ORDER — GABAPENTIN 300 MG PO CAPS: 300.0000 mg | ORAL_CAPSULE | Freq: Three times a day (TID) | ORAL | Status: DC

## 2015-07-06 MED ORDER — CETIRIZINE HCL 10 MG PO TABS: 10.0000 mg | ORAL_TABLET | Freq: Every day | ORAL | Status: DC

## 2015-07-06 MED ORDER — OMEPRAZOLE 20 MG PO CPDR: 20.0000 mg | DELAYED_RELEASE_CAPSULE | Freq: Two times a day (BID) | ORAL | Status: DC

## 2015-07-06 MED ORDER — DICYCLOMINE HCL 20 MG PO TABS: 20.0000 mg | ORAL_TABLET | Freq: Four times a day (QID) | ORAL | Status: DC

## 2015-07-06 MED ORDER — TRAZODONE HCL 50 MG PO TABS: 75.0000 mg | ORAL_TABLET | Freq: Every day | ORAL | Status: DC

## 2015-07-06 MED ORDER — ALBUTEROL SULFATE HFA 108 (90 BASE) MCG/ACT IN AERS: INHALATION_SPRAY | RESPIRATORY_TRACT | Status: DC

## 2015-07-06 NOTE — Patient Instructions (Signed)
Alcohol Withdrawal  Alcohol withdrawal is a group of symptoms that can develop when a person who drinks heavily and regularly stops drinking or drinks less.  CAUSES  Heavy and regular drinking can cause chemicals that send signals from the brain to the body (neurotransmitters) to deactivate. Alcohol withdrawal develops when deactivated neurotransmitters reactivate because a person stops drinking or drinks less.  RISK FACTORS  The more a person drinks and the longer he or she drinks, the greater the risk of alcohol withdrawal. Severe withdrawal is more likely to develop in someone who:  · Had severe alcohol withdrawal in the past.  · Had a seizure during a previous episode of alcohol withdrawal.  · Is elderly.  · Is pregnant.  · Has been abusing drugs.  · Has other medical problems, including:    Infection.    Heart, lung, or liver disease.    Seizures.    Mental health problems.  SYMPTOMS  Symptoms of this condition can be mild to moderate, or they can be severe.  Mild to moderate symptoms may include:  · Fatigue.  · Nightmares.  · Trouble sleeping.  · Depression.  · Anxiety.  · Inability to think clearly.  · Mood swings.  · Irritability.  · Loss of appetite.  · Nausea or vomiting.  · Clammy skin.  · Extreme sweating.  · Rapid heartbeat.  · Shakiness.  · Uncontrollable shaking (tremor).  Severe symptoms may include:  · Fever.  · Seizures.  · Severe confusion.  · Feeling or seeing things that are not there (hallucinations).  Symptoms usually begin within eight hours after a person stops drinking or drinks less. They can last for weeks.  DIAGNOSIS  Alcohol withdrawal is diagnosed with a medical history and physical exam. Sometimes, urine and blood tests are also done.  TREATMENT  Treatment may involve:  · Monitoring blood pressure, pulse, and breathing.  · Getting fluids through an IV tube.  · Medicine to reduce anxiety.  · Medicine to prevent or control seizures.  · Multivitamins and B vitamins.  · Having a health  care provider check on you daily.  If symptoms are moderate to severe or if there is a risk of severe withdrawal, treatment may be done at a hospital or treatment center.  HOME CARE INSTRUCTIONS  · Take medicines and vitamin supplements only as directed by your health care provider.  · Do not drink alcohol.  · Have someone stay with you or be available if you need help.  · Drink enough fluid to keep your urine clear or pale yellow.  · Consider joining a 12-step program or another alcohol support group.  SEEK MEDICAL CARE IF:  · Your symptoms get worse or do not go away.  · You cannot keep food or water in your stomach.  · You are struggling with not drinking alcohol.  · You cannot stop drinking alcohol.  SEEK IMMEDIATE MEDICAL CARE IF:   · You have an irregular heartbeat.  · You have chest pain.  · You have trouble breathing.  · You have symptoms of severe withdrawal, such as:    A fever.    Seizures.    Severe confusion.    Hallucinations.     This information is not intended to replace advice given to you by your health care provider. Make sure you discuss any questions you have with your health care provider.     Document Released: 04/30/2005 Document Revised: 08/11/2014 Document Reviewed: 05/09/2014  Elsevier Interactive 

## 2015-07-06 NOTE — Progress Notes (Signed)
Subjective:    Patient ID: Kristen Marsh, female    DOB: 04/09/1956, 59 y.o.   MRN: [id number]  PT presents to the office today for abd pain and lab work. Pt is a recovering alcoholic and states she has not had a drink in the last 62 days. Pt states she was in rehab, but states she "got kicked out because my blood work tested for alcohol". Pt states she did not understand how this could happen because she had not drank anything in 30 days. Pt states she went to GI two weeks ago and was told she had an "infection in my colon". Pt states she took antibiotics for two weeks. Pt states she finished the antibiotics yesterday. Pt states "every day" am getting better. PT states she has a hiatal hernia and has an appt on 07/23/15. Pt states she needs a referral today. PT states she needs her glucose checked, because she checked her blood sugar yesterday and it was in the "50's". Pt has a history of Hep C infection, cirrhosis, and cocaine addication. Pt states she also needs a referral to the [facility] for Cataract surgery on 07/12/15. HPI    Review of Systems  Constitutional: Negative.   HENT: Negative.   Eyes: Negative.   Respiratory: Negative.  Negative for shortness of breath.   Cardiovascular: Negative.  Negative for palpitations.  Gastrointestinal: Negative.   Endocrine: Negative.   Genitourinary: Negative.   Musculoskeletal: Negative.   Neurological: Negative.  Negative for headaches.  Hematological: Negative.   Psychiatric/Behavioral: Negative.   All other systems reviewed and are negative.      Objective:   Physical Exam  Constitutional: She is oriented to person, place, and time. She appears well-developed and well-nourished. No distress.  HENT:  Head: Normocephalic and atraumatic.  Eyes: Pupils are equal, round, and reactive to light.  Neck: Normal range of motion. Neck supple. No thyromegaly present.  Cardiovascular: Normal rate, regular rhythm and intact distal pulses.     Murmur heard. Pulmonary/Chest: Effort normal. No respiratory distress. She has wheezes.  Abdominal: Soft. Bowel sounds are normal. She exhibits distension. There is no tenderness.  Musculoskeletal: Normal range of motion. She exhibits no edema or tenderness.  Neurological: She is alert and oriented to person, place, and time. She has normal reflexes. No cranial nerve deficit.  Skin: Skin is warm and dry.  Psychiatric: She has a normal mood and affect. Judgment and thought content normal. She is hyperactive.  Pt whispering  Vitals reviewed.   BP 117/73 mmHg  Pulse 104  Temp(Src) 97.6 F (36.4 C) (Oral)  Ht '5\' 2"'  (1.575 m)  Wt 134 lb 3.2 oz (60.873 kg)  BMI 24.54 kg/m2       Assessment & Plan:  1. Other cirrhosis of liver ([place]) - Ambulatory referral to Gastroenterology - CMP14+EGFR  2. Gastroesophageal reflux disease, esophagitis presence not specified - Ambulatory referral to Gastroenterology - CMP14+EGFR - omeprazole (PRILOSEC) 20 MG capsule; Take 1 capsule (20 mg total) by mouth 2 (two) times daily.  Dispense: 90 capsule; Refill: 1  3. IBS (irritable bowel syndrome) - Ambulatory referral to Gastroenterology - CMP14+EGFR - dicyclomine (BENTYL) 20 MG tablet; Take 1 tablet (20 mg total) by mouth every 6 (six) hours.  Dispense: 120 tablet; Refill: 6  4. Fibromyalgia - CMP14+EGFR  5. Alcoholism /alcohol abuse ([place]) - Ambulatory referral to Gastroenterology - Vitamin B12 - CMP14+EGFR  6. COPD bronchitis - CMP14+EGFR - albuterol (PROVENTIL HFA) 108 (90 BASE) MCG/ACT inhaler;  INHALE 2 PUFFS INTO THE LUNGS EVERY FOUR HOURS AS NEEDED FOR WHEEZING.  Dispense: 6.7 g; Refill: 1  7. Cataract - Ambulatory referral to Ophthalmology  8. Insomnia - traZODone (DESYREL) 50 MG tablet; Take 1.5 tablets (75 mg total) by mouth at bedtime.  Dispense: 45 tablet; Refill: 1   Continue all meds and keep all appts with GI!!! Labs pending Health Maintenance reviewed Diet and exercise  encouraged RTO 3 months   Evelina Dun, FNP

## 2015-07-07 LAB — CMP14+EGFR
A/G RATIO: 0.8 — AB (ref 1.1–2.5)
ALBUMIN: 2.8 g/dL — AB (ref 3.5–5.5)
ALT: 35 IU/L — ABNORMAL HIGH (ref 0–32)
AST: 86 IU/L — ABNORMAL HIGH (ref 0–40)
Alkaline Phosphatase: 114 IU/L (ref 39–117)
BUN/Creatinine Ratio: 9 (ref 9–23)
BUN: 5 mg/dL — ABNORMAL LOW (ref 6–24)
Bilirubin Total: 1.8 mg/dL — ABNORMAL HIGH (ref 0.0–1.2)
CALCIUM: 8.1 mg/dL — AB (ref 8.7–10.2)
CO2: 21 mmol/L (ref 18–29)
Chloride: 104 mmol/L (ref 97–106)
Creatinine, Ser: 0.57 mg/dL (ref 0.57–1.00)
GFR, EST AFRICAN AMERICAN: 119 mL/min/{1.73_m2} (ref >59)
GFR, EST NON AFRICAN AMERICAN: 103 mL/min/{1.73_m2} (ref >59)
GLOBULIN, TOTAL: 3.6 g/dL (ref 1.5–4.5)
Glucose: 217 mg/dL — ABNORMAL HIGH (ref 65–99)
POTASSIUM: 4.3 mmol/L (ref 3.5–5.2)
SODIUM: 141 mmol/L (ref 136–144)
TOTAL PROTEIN: 6.4 g/dL (ref 6.0–8.5)

## 2015-07-07 LAB — VITAMIN B12: Vitamin B-12: >2000 pg/mL — ABNORMAL HIGH (ref 211–946)

## 2015-07-09 ENCOUNTER — Telehealth: Payer: Self-pay | Admitting: Family

## 2015-07-09 MED ORDER — ALBUTEROL SULFATE (2.5 MG/3ML) 0.083% IN NEBU: 2.5000 mg | INHALATION_SOLUTION | Freq: Four times a day (QID) | RESPIRATORY_TRACT | Status: AC | PRN

## 2015-07-09 MED ORDER — LACTULOSE 10 GM/15ML PO SOLN: ORAL | Status: DC

## 2015-07-09 NOTE — Telephone Encounter (Signed)
Refill for nebulizer medication and lactulose ordered.

## 2015-07-09 NOTE — Telephone Encounter (Signed)
Stp and reviewed test results.

## 2015-07-20 ENCOUNTER — Other Ambulatory Visit: Payer: Self-pay | Admitting: Family

## 2015-08-03 ENCOUNTER — Other Ambulatory Visit: Payer: Self-pay | Admitting: Family

## 2015-09-04 ENCOUNTER — Other Ambulatory Visit: Payer: Self-pay | Admitting: Family

## 2015-09-14 ENCOUNTER — Telehealth: Payer: Self-pay | Admitting: Family

## 2015-09-14 NOTE — Telephone Encounter (Signed)
Scheduled

## 2015-09-19 ENCOUNTER — Telehealth: Payer: Self-pay

## 2015-09-19 NOTE — Telephone Encounter (Signed)
Wants a dermatology referral ASAP

## 2015-09-19 NOTE — Telephone Encounter (Signed)
What does patient want dermatologist referral for?

## 2015-09-24 NOTE — Telephone Encounter (Signed)
Pt given appt 3/2 at 2:25 with [facility] ([place]).

## 2015-10-01 ENCOUNTER — Other Ambulatory Visit: Payer: Self-pay | Admitting: Family

## 2015-10-04 ENCOUNTER — Ambulatory Visit: Payer: Medicaid Other | Admitting: Family

## 2015-10-05 ENCOUNTER — Encounter: Payer: Self-pay | Admitting: Family

## 2015-10-12 ENCOUNTER — Encounter: Payer: Self-pay | Admitting: Family

## 2015-10-12 ENCOUNTER — Ambulatory Visit (INDEPENDENT_AMBULATORY_CARE_PROVIDER_SITE_OTHER): Payer: Medicaid Other | Admitting: Family

## 2015-10-12 VITALS — BP 129/75 | HR 80 | Temp 97.6°F | Ht 62.0 in | Wt 144.0 lb

## 2015-10-12 DIAGNOSIS — B372 Candidiasis of skin and nail: Secondary | ICD-10-CM

## 2015-10-12 DIAGNOSIS — G8929 Other chronic pain: Secondary | ICD-10-CM | POA: Diagnosis not present

## 2015-10-12 DIAGNOSIS — L409 Psoriasis, unspecified: Secondary | ICD-10-CM | POA: Diagnosis not present

## 2015-10-12 DIAGNOSIS — Z1239 Encounter for other screening for malignant neoplasm of breast: Secondary | ICD-10-CM

## 2015-10-12 DIAGNOSIS — M5030 Other cervical disc degeneration, unspecified cervical region: Secondary | ICD-10-CM | POA: Diagnosis not present

## 2015-10-12 DIAGNOSIS — M542 Cervicalgia: Secondary | ICD-10-CM

## 2015-10-12 MED ORDER — KETOROLAC TROMETHAMINE 60 MG/2ML IM SOLN
60.0000 mg | Freq: Once | INTRAMUSCULAR | Status: AC
  Administered 2015-10-12: 60 mg via INTRAMUSCULAR

## 2015-10-12 MED ORDER — NAPROXEN 500 MG PO TABS: 500.0000 mg | ORAL_TABLET | Freq: Two times a day (BID) | ORAL | Status: DC

## 2015-10-12 MED ORDER — CYCLOBENZAPRINE HCL 5 MG PO TABS: 5.0000 mg | ORAL_TABLET | Freq: Three times a day (TID) | ORAL | Status: DC | PRN

## 2015-10-12 MED ORDER — FLUCONAZOLE 150 MG PO TABS: 150.0000 mg | ORAL_TABLET | Freq: Once | ORAL | Status: DC

## 2015-10-12 MED ORDER — NYSTATIN-TRIAMCINOLONE 100000-0.1 UNIT/GM-% EX OINT: 1.0000 "application " | TOPICAL_OINTMENT | Freq: Two times a day (BID) | CUTANEOUS | Status: DC

## 2015-10-12 NOTE — Patient Instructions (Signed)
Cutaneous Candidiasis Cutaneous candidiasis is a condition in which there is an overgrowth of yeast (candida) on the skin. Yeast normally live on the skin, but in small enough numbers not to cause any symptoms. In certain cases, increased growth of the yeast may cause an actual yeast infection. This kind of infection usually occurs in areas of the skin that are constantly warm and moist, such as the armpits or the groin. Yeast is the most common cause of diaper rash in babies and in people who cannot control their bowel movements (incontinence). CAUSES  The fungus that most often causes cutaneous candidiasis is Candida albicans. Conditions that can increase the risk of getting a yeast infection of the skin include:  Obesity.  Pregnancy.  Diabetes.  Taking antibiotic medicine.  Taking birth control pills.  Taking steroid medicines.  Thyroid disease.  An iron or zinc deficiency.  Problems with the immune system. SYMPTOMS   Red, swollen area of the skin.  Bumps on the skin.  Itchiness. DIAGNOSIS  The diagnosis of cutaneous candidiasis is usually based on its appearance. Light scrapings of the skin may also be taken and viewed under a microscope to identify the presence of yeast. TREATMENT  Antifungal creams may be applied to the infected skin. In severe cases, oral medicines may be needed.  HOME CARE INSTRUCTIONS   Keep your skin clean and dry.  Maintain a healthy weight.  If you have diabetes, keep your blood sugar under control. SEEK IMMEDIATE MEDICAL CARE IF:  Your rash continues to spread despite treatment.  You have a fever, chills, or abdominal pain.   This information is not intended to replace advice given to you by your health care provider. Make sure you discuss any questions you have with your health care provider.   Document Released: 04/08/2011 Document Revised: 10/13/2011 Document Reviewed: 01/22/2015 Elsevier Interactive Patient Education 2016 Elsevier  Inc. Cervical Sprain A cervical sprain is an injury in the neck in which the strong, fibrous tissues (ligaments) that connect your neck bones stretch or tear. Cervical sprains can range from mild to severe. Severe cervical sprains can cause the neck vertebrae to be unstable. This can lead to damage of the spinal cord and can result in serious nervous system problems. The amount of time it takes for a cervical sprain to get better depends on the cause and extent of the injury. Most cervical sprains heal in 1 to 3 weeks. CAUSES  Severe cervical sprains may be caused by:   Contact sport injuries (such as from football, rugby, wrestling, hockey, auto racing, gymnastics, diving, martial arts, or boxing).   Motor vehicle collisions.   Whiplash injuries. This is an injury from a sudden forward and backward whipping movement of the head and neck.  Falls.  Mild cervical sprains may be caused by:   Being in an awkward position, such as while cradling a telephone between your ear and shoulder.   Sitting in a chair that does not offer proper support.   Working at a poorly Landscape [employer] station.   Looking up or down for long periods of time.  SYMPTOMS   Pain, soreness, stiffness, or a burning sensation in the front, back, or sides of the neck. This discomfort may develop immediately after the injury or slowly, 24 hours or more after the injury.   Pain or tenderness directly in the middle of the back of the neck.   Shoulder or upper back pain.   Limited ability to move the neck.  Headache.   Dizziness.   Weakness, numbness, or tingling in the hands or arms.   Muscle spasms.   Difficulty swallowing or chewing.   Tenderness and swelling of the neck.  DIAGNOSIS  Most of the time your health care provider can diagnose a cervical sprain by taking your history and doing a physical exam. Your health care provider will ask about previous neck injuries and any known neck  problems, such as arthritis in the neck. X-rays may be taken to find out if there are any other problems, such as with the bones of the neck. Other tests, such as a CT scan or MRI, may also be needed.  TREATMENT  Treatment depends on the severity of the cervical sprain. Mild sprains can be treated with rest, keeping the neck in place (immobilization), and pain medicines. Severe cervical sprains are immediately immobilized. Further treatment is done to help with pain, muscle spasms, and other symptoms and may include:  Medicines, such as pain relievers, numbing medicines, or muscle relaxants.   Physical therapy. This may involve stretching exercises, strengthening exercises, and posture training. Exercises and improved posture can help stabilize the neck, strengthen muscles, and help stop symptoms from returning.  HOME CARE INSTRUCTIONS   Put ice on the injured area.   Put ice in a plastic bag.   Place a towel between your skin and the bag.   Leave the ice on for 15-20 minutes, 3-4 times a day.   If your injury was severe, you may have been given a cervical collar to wear. A cervical collar is a two-piece collar designed to keep your neck from moving while it heals.  Do not remove the collar unless instructed by your health care provider.  If you have long hair, keep it outside of the collar.  Ask your health care provider before making any adjustments to your collar. Minor adjustments may be required over time to improve comfort and reduce pressure on your chin or on the back of your head.  Ifyou are allowed to remove the collar for cleaning or bathing, follow your health care provider's instructions on how to do so safely.  Keep your collar clean by wiping it with mild soap and water and drying it completely. If the collar you have been given includes removable pads, remove them every 1-2 days and hand wash them with soap and water. Allow them to air dry. They should be completely  dry before you wear them in the collar.  If you are allowed to remove the collar for cleaning and bathing, wash and dry the skin of your neck. Check your skin for irritation or sores. If you see any, tell your health care provider.  Do not drive while wearing the collar.   Only take over-the-counter or prescription medicines for pain, discomfort, or fever as directed by your health care provider.   Keep all follow-up appointments as directed by your health care provider.   Keep all physical therapy appointments as directed by your health care provider.   Make any needed adjustments to your workstation to promote good posture.   Avoid positions and activities that make your symptoms worse.   Warm up and stretch before being active to help prevent problems.  SEEK MEDICAL CARE IF:   Your pain is not controlled with medicine.   You are unable to decrease your pain medicine over time as planned.   Your activity level is not improving as expected.  SEEK IMMEDIATE MEDICAL CARE IF:  You develop any bleeding.  You develop stomach upset.  You have signs of an allergic reaction to your medicine.   Your symptoms get worse.   You develop new, unexplained symptoms.   You have numbness, tingling, weakness, or paralysis in any part of your body.  MAKE SURE YOU:   Understand these instructions.  Will watch your condition.  Will get help right away if you are not doing well or get worse.   This information is not intended to replace advice given to you by your health care provider. Make sure you discuss any questions you have with your health care provider.   Document Released: 05/18/2007 Document Revised: 07/26/2013 Document Reviewed: 01/26/2013 Elsevier Interactive Patient Education [employer].

## 2015-10-12 NOTE — Progress Notes (Signed)
Subjective:    Patient ID: Kristen Marsh, female    DOB: April 06, 1956, 60 y.o.   MRN: [id number]  Neck Pain  This is a chronic problem. The current episode started more than 1 year ago. The problem occurs constantly. The problem has been waxing and waning. The pain is associated with nothing. The pain is present in the midline. The quality of the pain is described as aching. The pain is at a severity of 10/10. The pain is moderate. The symptoms are aggravated by bending. Associated symptoms include headaches. Pertinent negatives include no leg pain, numbness, photophobia, syncope, trouble swallowing, visual change or weakness. She has tried NSAIDs, heat, ice and bed rest for the symptoms. The treatment provided mild relief.  Rash This is a chronic (psoriasis) problem. The current episode started more than 1 year ago. The problem has been waxing and waning since onset. The affected locations include the left arm and right elbow. The rash is characterized by itchiness, pain and redness. She was exposed to nothing. Pertinent negatives include no cough, diarrhea, eye pain, fatigue, joint pain, shortness of breath or sore throat. Past treatments include anti-itch cream and moisturizer. The treatment provided mild relief.      Review of Systems  Constitutional: Negative.  Negative for fatigue.  HENT: Negative for sore throat and trouble swallowing.   Eyes: Negative.  Negative for photophobia and pain.  Respiratory: Negative.  Negative for cough and shortness of breath.   Cardiovascular: Negative.  Negative for palpitations and syncope.  Gastrointestinal: Negative.  Negative for diarrhea.  Endocrine: Negative.   Genitourinary: Negative.   Musculoskeletal: Positive for neck pain. Negative for joint pain.  Skin: Positive for rash.  Neurological: Positive for headaches. Negative for weakness and numbness.  Hematological: Negative.   Psychiatric/Behavioral: Negative.   All other systems reviewed and  are negative.      Objective:   Physical Exam  Constitutional: She is oriented to person, place, and time. She appears well-developed and well-nourished. No distress.  HENT:  Head: Normocephalic and atraumatic.  Eyes: Pupils are equal, round, and reactive to light.  Neck: Normal range of motion. Neck supple. No thyromegaly present.  Cardiovascular: Normal rate, regular rhythm, normal heart sounds and intact distal pulses.   No murmur heard. Pulmonary/Chest: Effort normal and breath sounds normal. No respiratory distress. She has no wheezes.  Abdominal: Soft. Bowel sounds are normal. She exhibits no distension. There is no tenderness.  Musculoskeletal: Normal range of motion. She exhibits no edema or tenderness.  Neurological: She is alert and oriented to person, place, and time.  Skin: Skin is warm and dry. Rash noted. There is erythema.  Psoriasis rash- erythemas scaly rash on bilateral elbows Left breast erythemas- fungal like rash   Psychiatric: She has a normal mood and affect. Her behavior is normal. Judgment and thought content normal.  Vitals reviewed.   BP 129/75 mmHg  Pulse 80  Temp(Src) 97.6 F (36.4 C) (Oral)  Ht 5\' 2"  (1.575 m)  Wt 144 lb (65.318 kg)  BMI 26.33 kg/m2       Assessment & Plan:  1. Chronic neck pain -Pt has taken in past with no complication- If swelling devolps pt to stop -Sedation precaution discussed - Ambulatory referral to Orthopedic Surgery - cyclobenzaprine (FLEXERIL) 5 MG tablet; Take 1 tablet (5 mg total) by mouth 3 (three) times daily as needed for muscle spasms.  Dispense: 30 tablet; Refill: 0 - naproxen (NAPROSYN) 500 MG tablet; Take 1 tablet (500 mg  total) by mouth 2 (two) times daily with a meal.  Dispense: 60 tablet; Refill: 1  2. Psoriasis - Ambulatory referral to Dermatology  3. Yeast infection of the skin -Keep clean and dry - fluconazole (DIFLUCAN) 150 MG tablet; Take 1 tablet (150 mg total) by mouth once.  Dispense: 1  tablet; Refill: 0 - nystatin-triamcinolone ointment (MYCOLOG); Apply 1 application topically 2 (two) times daily.  Dispense: 30 g; Refill: 0  4. Degenerative cervical disc   Continue all meds Labs pending Health Maintenance reviewed Diet and exercise encouraged RTO as needed  Evelina Dun, FNP

## 2015-10-12 NOTE — Addendum Note (Signed)
Addended by: Evelina Dun A on: 10/12/2015 05:00 PM   Modules accepted: Orders

## 2015-10-18 ENCOUNTER — Other Ambulatory Visit: Payer: Self-pay | Admitting: Family

## 2015-10-18 MED ORDER — LIDOCAINE 5 % EX PTCH: 1.0000 | MEDICATED_PATCH | CUTANEOUS | Status: DC

## 2015-10-18 NOTE — Telephone Encounter (Signed)
Patient requesting [facility] refill on lidoderm pat

## 2015-10-23 ENCOUNTER — Telehealth: Payer: Self-pay

## 2015-10-23 MED ORDER — DICLOFENAC SODIUM 1 % TD GEL: 2.0000 g | Freq: Four times a day (QID) | TRANSDERMAL | Status: DC

## 2015-10-23 NOTE — Telephone Encounter (Signed)
Medicaid non preferred Lidocaine   Preferred is Voltaren gel

## 2015-10-23 NOTE — Telephone Encounter (Signed)
Insurance denied lidocaine patches. Voltaren gel Prescription sent to pharmacy

## 2015-10-26 ENCOUNTER — Encounter: Payer: Self-pay | Admitting: Orthopaedic Surgery

## 2015-11-01 ENCOUNTER — Other Ambulatory Visit: Payer: Self-pay | Admitting: Family

## 2015-11-22 ENCOUNTER — Other Ambulatory Visit: Payer: Self-pay | Admitting: Family

## 2015-11-26 ENCOUNTER — Encounter: Payer: Medicaid Other | Admitting: *Deleted

## 2015-11-27 ENCOUNTER — Telehealth: Payer: Self-pay

## 2015-11-27 MED ORDER — DICLOFENAC SODIUM 1 % TD GEL: 2.0000 g | Freq: Four times a day (QID) | TRANSDERMAL | Status: DC

## 2015-11-27 NOTE — Telephone Encounter (Signed)
Medicaid non preferred Lidocaine  Preferred is Voltaren gel

## 2015-12-13 ENCOUNTER — Other Ambulatory Visit: Payer: Self-pay | Admitting: Family

## 2016-01-07 ENCOUNTER — Other Ambulatory Visit: Payer: Self-pay | Admitting: Family

## 2016-01-30 ENCOUNTER — Telehealth: Payer: Self-pay | Admitting: *Deleted

## 2016-01-30 DIAGNOSIS — H269 Unspecified cataract: Secondary | ICD-10-CM

## 2016-01-30 NOTE — Telephone Encounter (Signed)
Pt wants referral for opthamologist that comes to [facility] for cataracts Please review and advise

## 2016-02-07 ENCOUNTER — Ambulatory Visit: Payer: Medicaid Other | Admitting: Family

## 2016-02-08 ENCOUNTER — Encounter: Payer: Self-pay | Admitting: Family

## 2016-02-25 ENCOUNTER — Other Ambulatory Visit: Payer: Self-pay | Admitting: Family

## 2016-03-07 ENCOUNTER — Other Ambulatory Visit: Payer: Self-pay | Admitting: Family

## 2016-03-12 ENCOUNTER — Other Ambulatory Visit: Payer: Self-pay | Admitting: Family

## 2016-03-12 DIAGNOSIS — M542 Cervicalgia: Principal | ICD-10-CM

## 2016-03-12 DIAGNOSIS — G8929 Other chronic pain: Secondary | ICD-10-CM

## 2016-03-27 ENCOUNTER — Other Ambulatory Visit: Payer: Self-pay | Admitting: Family

## 2016-04-03 ENCOUNTER — Other Ambulatory Visit: Payer: Self-pay | Admitting: Family

## 2016-04-04 ENCOUNTER — Other Ambulatory Visit: Payer: Self-pay | Admitting: Family

## 2016-04-04 DIAGNOSIS — K589 Irritable bowel syndrome without diarrhea: Secondary | ICD-10-CM

## 2016-04-04 MED ORDER — DICYCLOMINE HCL 20 MG PO TABS: 20.0000 mg | ORAL_TABLET | Freq: Four times a day (QID) | ORAL | 6 refills | Status: DC

## 2016-04-04 NOTE — Telephone Encounter (Signed)
May go ahead and do a one-month refill but patient needs a visit for routine check with [employer].

## 2016-04-04 NOTE — Telephone Encounter (Signed)
Pt aware.

## 2016-05-02 ENCOUNTER — Other Ambulatory Visit: Payer: Self-pay | Admitting: Family

## 2016-05-02 DIAGNOSIS — M542 Cervicalgia: Principal | ICD-10-CM

## 2016-05-02 DIAGNOSIS — G8929 Other chronic pain: Secondary | ICD-10-CM

## 2016-05-02 NOTE — Telephone Encounter (Signed)
Patient NTBS for follow up and lab work  

## 2016-05-03 ENCOUNTER — Other Ambulatory Visit: Payer: Self-pay | Admitting: Family

## 2016-05-03 DIAGNOSIS — G8929 Other chronic pain: Secondary | ICD-10-CM

## 2016-05-03 DIAGNOSIS — M542 Cervicalgia: Principal | ICD-10-CM

## 2016-05-05 NOTE — Telephone Encounter (Signed)
Patient NTBS for follow up and lab work. No more refills until follow up appt

## 2016-05-26 ENCOUNTER — Telehealth: Payer: Self-pay | Admitting: Family

## 2016-05-26 ENCOUNTER — Telehealth: Payer: Self-pay

## 2016-05-27 NOTE — Telephone Encounter (Signed)
Patient aware, she has not chosen anyone yet but [facility]

## 2016-05-27 NOTE — Telephone Encounter (Signed)
I am unsure of any specific office, but [facility] could [facility] viewed.

## 2016-05-27 NOTE — Telephone Encounter (Signed)
See other telephone noted

## 2016-06-02 ENCOUNTER — Telehealth: Payer: Self-pay | Admitting: Family

## 2016-06-02 NOTE — Telephone Encounter (Signed)
Please address - she hasnt been seen since 10/2015

## 2016-06-02 NOTE — Telephone Encounter (Signed)
What medications does patient need? Several have been refill on 10/02 & 10/14.

## 2016-06-04 ENCOUNTER — Other Ambulatory Visit: Payer: Self-pay | Admitting: Family

## 2016-06-04 DIAGNOSIS — G8929 Other chronic pain: Secondary | ICD-10-CM

## 2016-06-04 DIAGNOSIS — M542 Cervicalgia: Principal | ICD-10-CM

## 2016-06-04 MED ORDER — GABAPENTIN 300 MG PO CAPS: ORAL_CAPSULE | ORAL | 0 refills | Status: DC

## 2016-06-04 MED ORDER — DICYCLOMINE HCL 20 MG PO TABS: 20.0000 mg | ORAL_TABLET | Freq: Four times a day (QID) | ORAL | 0 refills | Status: DC

## 2016-06-04 MED ORDER — OMEPRAZOLE 20 MG PO CPDR: 20.0000 mg | DELAYED_RELEASE_CAPSULE | Freq: Two times a day (BID) | ORAL | 0 refills | Status: DC

## 2016-06-04 MED ORDER — NAPROXEN 500 MG PO TABS: 500.0000 mg | ORAL_TABLET | Freq: Two times a day (BID) | ORAL | 0 refills | Status: DC

## 2016-06-04 MED ORDER — LACTULOSE 10 GM/15ML PO SOLN: ORAL | 0 refills | Status: DC

## 2016-06-04 MED ORDER — CYCLOBENZAPRINE HCL 5 MG PO TABS: ORAL_TABLET | ORAL | 0 refills | Status: DC

## 2016-06-04 NOTE — Telephone Encounter (Signed)
Pt called on 06/02/2016 requesting refills Requested meds were sent in for 1 mth only  Okayed per Dr Evette Doffing

## 2016-06-04 NOTE — Telephone Encounter (Signed)
Pt called to request refills on  Naproxen 500mg  Bentyl 20mg  Omeprazole 20mg  Lactulose Gabapentin 300mg  Flexeril 5mg  Needs today if possible Please send in to [place]  [phone]

## 2016-06-05 ENCOUNTER — Other Ambulatory Visit: Payer: Self-pay | Admitting: Family

## 2016-06-05 DIAGNOSIS — M542 Cervicalgia: Principal | ICD-10-CM

## 2016-06-05 DIAGNOSIS — G8929 Other chronic pain: Secondary | ICD-10-CM

## 2016-06-05 MED ORDER — CYCLOBENZAPRINE HCL 5 MG PO TABS: ORAL_TABLET | ORAL | 0 refills | Status: DC

## 2016-06-05 MED ORDER — OMEPRAZOLE 20 MG PO CPDR: 20.0000 mg | DELAYED_RELEASE_CAPSULE | Freq: Two times a day (BID) | ORAL | 0 refills | Status: DC

## 2016-06-05 MED ORDER — DICYCLOMINE HCL 20 MG PO TABS: 20.0000 mg | ORAL_TABLET | Freq: Four times a day (QID) | ORAL | 0 refills | Status: DC

## 2016-06-05 MED ORDER — GABAPENTIN 300 MG PO CAPS: ORAL_CAPSULE | ORAL | 0 refills | Status: DC

## 2016-06-05 MED ORDER — NAPROXEN 500 MG PO TABS: 500.0000 mg | ORAL_TABLET | Freq: Two times a day (BID) | ORAL | 0 refills | Status: DC

## 2016-06-05 MED ORDER — LACTULOSE 10 GM/15ML PO SOLN: ORAL | 0 refills | Status: DC

## 2016-06-05 NOTE — Telephone Encounter (Signed)
Prescription sent to pharmacy.

## 2016-06-09 ENCOUNTER — Ambulatory Visit: Payer: Medicaid Other | Admitting: Family

## 2016-06-10 ENCOUNTER — Encounter: Payer: Self-pay | Admitting: Family

## 2016-06-13 ENCOUNTER — Ambulatory Visit (INDEPENDENT_AMBULATORY_CARE_PROVIDER_SITE_OTHER): Payer: Medicaid Other | Admitting: Family

## 2016-06-13 ENCOUNTER — Encounter: Payer: Self-pay | Admitting: Family

## 2016-06-13 VITALS — BP 124/74 | HR 99 | Temp 97.9°F | Ht 62.0 in | Wt 149.6 lb

## 2016-06-13 DIAGNOSIS — G8929 Other chronic pain: Secondary | ICD-10-CM | POA: Diagnosis not present

## 2016-06-13 DIAGNOSIS — Z23 Encounter for immunization: Secondary | ICD-10-CM

## 2016-06-13 DIAGNOSIS — L409 Psoriasis, unspecified: Secondary | ICD-10-CM | POA: Diagnosis not present

## 2016-06-13 DIAGNOSIS — J42 Unspecified chronic bronchitis: Secondary | ICD-10-CM | POA: Diagnosis not present

## 2016-06-13 DIAGNOSIS — M546 Pain in thoracic spine: Secondary | ICD-10-CM

## 2016-06-13 DIAGNOSIS — M797 Fibromyalgia: Secondary | ICD-10-CM | POA: Diagnosis not present

## 2016-06-13 DIAGNOSIS — R739 Hyperglycemia, unspecified: Secondary | ICD-10-CM | POA: Diagnosis not present

## 2016-06-13 LAB — BAYER DCA HB A1C WAIVED: HB A1C (BAYER DCA - WAIVED): 4.9 % (ref <7.0)

## 2016-06-13 MED ORDER — GABAPENTIN 400 MG PO CAPS: 400.0000 mg | ORAL_CAPSULE | Freq: Three times a day (TID) | ORAL | 3 refills | Status: DC

## 2016-06-13 MED ORDER — FLUTICASONE FUROATE-VILANTEROL 100-25 MCG/INH IN AEPB: 1.0000 | INHALATION_SPRAY | Freq: Every day | RESPIRATORY_TRACT | 3 refills | Status: DC

## 2016-06-13 NOTE — Progress Notes (Signed)
Subjective:    Patient ID: Kristen Marsh, female    DOB: Mar 06, 1956, 60 y.o.   MRN: [id number]  Pt presents to the office today with complaints of SOB, skin rash, and back pain.  Shortness of Breath  This is a new problem. The current episode started more than 1 month ago. The problem occurs constantly. The problem has been waxing and waning. Associated symptoms include a rash and sputum production. Pertinent negatives include no ear pain, fever, rhinorrhea or sore throat. She has tried rest and ipratropium inhalers for the symptoms. The treatment provided mild relief. Her past medical history is significant for COPD.  Back Pain  This is a new problem. The current episode started 1 to 4 weeks ago. The problem occurs intermittently. The problem has been waxing and waning since onset. The pain is present in the thoracic spine. The quality of the pain is described as aching. The pain is moderate. Pertinent negatives include no fever, numbness, tingling or weakness. She has tried bed rest and muscle relaxant (gabapentin) for the symptoms. The treatment provided mild relief.  Rash  This is a recurrent problem. The current episode started more than 1 year ago. The problem has been waxing and waning since onset. The affected locations include the left buttock, right elbow, left foot, right buttock, right ankle and right lower leg. The rash is characterized by itchiness and redness. Associated symptoms include shortness of breath. Pertinent negatives include no fever, rhinorrhea or sore throat. Past treatments include oral steroids, anti-itch cream, antihistamine and moisturizer. The treatment provided mild relief. Her past medical history is significant for eczema.      Review of Systems  Constitutional: Negative for fever.  HENT: Negative for ear pain, rhinorrhea and sore throat.   Respiratory: Positive for sputum production and shortness of breath.   Musculoskeletal: Positive for back pain.  Skin:  Positive for rash.  Neurological: Negative for tingling, weakness and numbness.       Objective:   Physical Exam  Constitutional: She is oriented to person, place, and time. She appears well-developed and well-nourished. No distress.  HENT:  Head: Normocephalic and atraumatic.  Eyes: Pupils are equal, round, and reactive to light.  Neck: Normal range of motion. Neck supple. No thyromegaly present.  Cardiovascular: Normal rate, regular rhythm, normal heart sounds and intact distal pulses.   No murmur heard. Pulmonary/Chest: Effort normal and breath sounds normal. No respiratory distress. She has no wheezes.  Abdominal: Soft. Bowel sounds are normal. She exhibits no distension. There is no tenderness.  Musculoskeletal: Normal range of motion. She exhibits no edema or tenderness.  Neurological: She is alert and oriented to person, place, and time.  Skin: Skin is warm and dry. Rash noted.  Erythemas plaque like rash on right elbow, bilateral hands, and bilateral ankles.  Psychiatric: Judgment and thought content normal. Her mood appears anxious. Her speech is slurred. She is hyperactive.  Vitals reviewed.     Temp 97.9 F (36.6 C) (Oral)   Ht 5\' 2"  (1.575 m)   Wt 149 lb 9.6 oz (67.9 kg)   BMI 27.36 kg/m      Assessment & Plan:  1. Psoriasis -Continue creams -Do not scratch -Sunlgiht - Ambulatory referral to Dermatology  2. Chronic bronchitis, unspecified chronic bronchitis type ([place]) -Smoking cessation discussed -Started on Breo - fluticasone furoate-vilanterol (BREO ELLIPTA) 100-25 MCG/INH AEPB; Inhale 1 puff into the lungs daily.  Dispense: 3 each; Refill: 3  3. Fibromyalgia -Gabapentin increased to 400 mg  TID from 300 mg TID - gabapentin (NEURONTIN) 400 MG capsule; Take 1 capsule (400 mg total) by mouth 3 (three) times daily.  Dispense: 90 capsule; Refill: 3  4. Chronic bilateral thoracic back pain -Gabapentin increased to 400 mg from 300 mg  -ROM exercises -  gabapentin (NEURONTIN) 400 MG capsule; Take 1 capsule (400 mg total) by mouth 3 (three) times daily.  Dispense: 90 capsule; Refill: 3   PT is very spasticity today. I believe that she on some type of medication or drug. Pt has history of alcohol and cocaine and drug abuse. Pt is dismissed from the clinic and is her last appt here. We will not do drug screen since we will not be prescribing controlled substances today.   Evelina Dun, FNP  Evelina Dun, [place]

## 2016-06-13 NOTE — Addendum Note (Signed)
Addended by: Evelina Dun A on: 06/13/2016 01:11 PM   Modules accepted: Orders

## 2016-06-13 NOTE — Patient Instructions (Addendum)
Psoriasis Psoriasis is a long-term (chronic) condition of skin inflammation. It occurs because your immune system causes skin cells to form too quickly. As a result, too many skin cells grow and create raised, red patches (plaques) that look silvery on your skin. Plaques may appear anywhere on your body. They can be any size or shape. Psoriasis can come and go. The condition varies from mild to very severe. It cannot be passed from one person to another (not contagious).  CAUSES  The cause of psoriasis is not known, but certain factors can make the condition worse. These include:   Damage or trauma to the skin, such as cuts, scrapes, sunburn, and dryness.  Lack of sunlight.  Certain medicines.  Alcohol.  Tobacco use.  Stress.  Infections caused by bacteria or viruses. RISK FACTORS This condition is more likely to develop in:  People with a family history of psoriasis.  People who are Caucasian.  People who are between the ages of 15-30 and 50-60 years old. SYMPTOMS  There are five different types of psoriasis. You can have more than one type of psoriasis during your life. Types are:   Plaque.  Guttate.  Inverse.  Pustular.  Erythrodermic. Each type of psoriasis has different symptoms.   Plaque psoriasis symptoms include red, raised plaques with a silvery white coating (scale). These plaques may be itchy. Your nails may be pitted and crumbly or fall off.  Guttate psoriasis symptoms include small red spots that often show up on your trunk, arms, and legs. These spots may develop after you have been sick, especially with strep throat.  Inverse psoriasis symptoms include plaques in your underarm area, under your breasts, or on your genitals, groin, or buttocks.  Pustular psoriasis symptoms include pus-filled bumps that are painful, red, and swollen on the palms of your hands or the soles of your feet. You also may feel exhausted, feverish, weak, or have no  appetite.  Erythrodermic psoriasis symptoms include bright red skin that may look burned. You may have a fast heartbeat and a body temperature that is too high or too low. You may be itchy or in pain. DIAGNOSIS  Your health care provider may suspect psoriasis based on your symptoms and family history. Your health care provider will also do a physical exam. This may include a procedure to remove a tissue sample (biopsy) for testing. You may also be referred to a health care provider who specializes in skin diseases (dermatologist).  TREATMENT There is no cure for this condition, but treatment can help manage it. Goals of treatment include:   Helping your skin heal.  Reducing itching and inflammation.  Slowing the growth of new skin cells.  Helping your immune system respond better to your skin. Treatment varies, depending on the severity of your condition. Treatment may include:   Creams or ointments.  Ultraviolet ray exposure (light therapy). This may include natural sunlight or light therapy in a medical office.  Medicines (systemic therapy). These medicines can help your body better manage skin cell turnover and inflammation. They may be used along with light therapy or ointments. You may also get antibiotic medicines if you have an infection. HOME CARE INSTRUCTIONS Skin Care  Moisturize your skin as needed. Only use moisturizers that have been approved by your health care provider.   Apply cool compresses to the affected areas.   Do not scratch your skin.  Lifestyle  Do not use tobacco products. This includes cigarettes, chewing tobacco, and e-cigarettes. If you   need help quitting, ask your health care provider.  Drink little or no alcohol.   Try techniques for stress reduction, such as meditation or yoga.  Get exposure to the sun as told by your health care provider. Do not get sunburned.   Consider joining a psoriasis support group.  Medicines  Take or use  over-the-counter and prescription medicines only as told by your health care provider.  If you were prescribed an antibiotic, take or use it as told by your health care provider. Do not stop taking the antibiotic even if your condition starts to improve. General Instructions  Keep a journal to help track what triggers an outbreak. Try to avoid any triggers.   See a counselor or social worker if feelings of sadness, frustration, and hopelessness about your condition are interfering with your work and relationships.  Keep all follow-up visits as told by your health care provider. This is important. SEEK MEDICAL CARE IF:  Your pain gets worse.  You have increasing redness or warmth in the affected areas.   You have new or worsening pain or stiffness in your joints.  Your nails start to break easily or pull away from the nail bed.   You have a fever.   You feel depressed.   This information is not intended to replace advice given to you by your health care provider. Make sure you discuss any questions you have with your health care provider.   Document Released: 07/18/2000 Document Revised: 04/11/2015 Document Reviewed: 12/06/2014 Elsevier Interactive Patient Education 2016 [employer]  Chronic Obstructive Pulmonary Disease Chronic obstructive pulmonary disease (COPD) is a common lung condition in which airflow from the lungs is limited. COPD is a general term that can be used to describe many different lung problems that limit airflow, including both chronic bronchitis and emphysema. If you have COPD, your lung function will probably never return to normal, but there are measures you can take to improve lung function and make yourself feel better. CAUSES   Smoking (common).  Exposure to secondhand smoke.  Genetic problems.  Chronic inflammatory lung diseases or recurrent infections. SYMPTOMS  Shortness of breath, especially with physical activity.  Deep, persistent  (chronic) cough with a large amount of thick mucus.  Wheezing.  Rapid breaths (tachypnea).  Gray or bluish discoloration (cyanosis) of the skin, especially in your fingers, toes, or lips.  Fatigue.  Weight loss.  Frequent infections or episodes when breathing symptoms become much worse (exacerbations).  Chest tightness. DIAGNOSIS Your health care provider will take a medical history and perform a physical examination to diagnose COPD. Additional tests for COPD may include:  Lung (pulmonary) function tests.  Chest X-ray.  CT scan.  Blood tests. TREATMENT  Treatment for COPD may include:  Inhaler and nebulizer medicines. These help manage the symptoms of COPD and make your breathing more comfortable.  Supplemental oxygen. Supplemental oxygen is only helpful if you have a low oxygen level in your blood.  Exercise and physical activity. These are beneficial for nearly all people with COPD.  Lung surgery or transplant.  Nutrition therapy to gain weight, if you are underweight.  Pulmonary rehabilitation. This may involve working with a team of health care providers and specialists, such as respiratory, occupational, and physical therapists. HOME CARE INSTRUCTIONS  Take all medicines (inhaled or pills) as directed by your health care provider.  Avoid over-the-counter medicines or cough syrups that dry up your airway (such as antihistamines) and slow down the elimination of secretions unless  instructed otherwise by your health care provider.  If you are a smoker, the most important thing that you can do is stop smoking. Continuing to smoke will cause further lung damage and breathing trouble. Ask your health care provider for help with quitting smoking. He or she can direct you to community resources or hospitals that provide support.  Avoid exposure to irritants such as smoke, chemicals, and fumes that aggravate your breathing.  Use oxygen therapy and pulmonary  rehabilitation if directed by your health care provider. If you require home oxygen therapy, ask your health care provider whether you should purchase a pulse oximeter to measure your oxygen level at home.  Avoid contact with individuals who have a contagious illness.  Avoid extreme temperature and humidity changes.  Eat healthy foods. Eating smaller, more frequent meals and resting before meals may help you maintain your strength.  Stay active, but balance activity with periods of rest. Exercise and physical activity will help you maintain your ability to do things you want to do.  Preventing infection and hospitalization is very important when you have COPD. Make sure to receive all the vaccines your health care provider recommends, especially the pneumococcal and influenza vaccines. Ask your health care provider whether you need a pneumonia vaccine.  Learn and use relaxation techniques to manage stress.  Learn and use controlled breathing techniques as directed by your health care provider. Controlled breathing techniques include:  Pursed lip breathing. Start by breathing in (inhaling) through your nose for 1 second. Then, purse your lips as if you were going to whistle and breathe out (exhale) through the pursed lips for 2 seconds.  Diaphragmatic breathing. Start by putting one hand on your abdomen just above your waist. Inhale slowly through your nose. The hand on your abdomen should move out. Then purse your lips and exhale slowly. You should be able to feel the hand on your abdomen moving in as you exhale.  Learn and use controlled coughing to clear mucus from your lungs. Controlled coughing is a series of short, progressive coughs. The steps of controlled coughing are: 1. Lean your head slightly forward. 2. Breathe in deeply using diaphragmatic breathing. 3. Try to hold your breath for 3 seconds. 4. Keep your mouth slightly open while coughing twice. 5. Spit any mucus out into a  tissue. 6. Rest and repeat the steps once or twice as needed. SEEK MEDICAL CARE IF:  You are coughing up more mucus than usual.  There is a change in the color or thickness of your mucus.  Your breathing is more labored than usual.  Your breathing is faster than usual. SEEK IMMEDIATE MEDICAL CARE IF:  You have shortness of breath while you are resting.  You have shortness of breath that prevents you from:  Being able to talk.  Performing your usual physical activities.  You have chest pain lasting longer than 5 minutes.  Your skin color is more cyanotic than usual.  You measure low oxygen saturations for longer than 5 minutes with a pulse oximeter. MAKE SURE YOU:  Understand these instructions.  Will watch your condition.  Will get help right away if you are not doing well or get worse.   This information is not intended to replace advice given to you by your health care provider. Make sure you discuss any questions you have with your health care provider.   Document Released: 04/30/2005 Document Revised: 08/11/2014 Document Reviewed: 03/17/2013 Elsevier Interactive Patient Education [employer].

## 2016-06-14 ENCOUNTER — Other Ambulatory Visit: Payer: Self-pay | Admitting: Family

## 2016-06-14 LAB — CMP14+EGFR
ALBUMIN: 2.8 g/dL — AB (ref 3.5–5.5)
ALT: 50 IU/L — AB (ref 0–32)
AST: 105 IU/L — ABNORMAL HIGH (ref 0–40)
Albumin/Globulin Ratio: 0.8 — ABNORMAL LOW (ref 1.2–2.2)
Alkaline Phosphatase: 146 IU/L — ABNORMAL HIGH (ref 39–117)
BUN/Creatinine Ratio: 10 (ref 9–23)
BUN: 6 mg/dL (ref 6–24)
Bilirubin Total: 1 mg/dL (ref 0.0–1.2)
CALCIUM: 8.2 mg/dL — AB (ref 8.7–10.2)
CO2: 24 mmol/L (ref 18–29)
CREATININE: 0.61 mg/dL (ref 0.57–1.00)
Chloride: 103 mmol/L (ref 96–106)
GFR, EST AFRICAN AMERICAN: 116 mL/min/{1.73_m2} (ref >59)
GFR, EST NON AFRICAN AMERICAN: 100 mL/min/{1.73_m2} (ref >59)
GLUCOSE: 86 mg/dL (ref 65–99)
Globulin, Total: 3.5 g/dL (ref 1.5–4.5)
Potassium: 4.4 mmol/L (ref 3.5–5.2)
Sodium: 138 mmol/L (ref 134–144)
TOTAL PROTEIN: 6.3 g/dL (ref 6.0–8.5)

## 2016-06-16 ENCOUNTER — Telehealth: Payer: Self-pay | Admitting: Family

## 2016-06-16 DIAGNOSIS — K7469 Other cirrhosis of liver: Secondary | ICD-10-CM

## 2016-06-16 NOTE — Telephone Encounter (Signed)
Pt requesting RX for Zyrtec and a fluid pill Please advise

## 2016-06-17 ENCOUNTER — Telehealth: Payer: Self-pay

## 2016-06-17 MED ORDER — BUDESONIDE-FORMOTEROL FUMARATE 80-4.5 MCG/ACT IN AERO: 2.0000 | INHALATION_SPRAY | Freq: Two times a day (BID) | RESPIRATORY_TRACT | 3 refills | Status: DC

## 2016-06-17 NOTE — Telephone Encounter (Signed)
Left message for patient to call back regarding medication. There is no fluid pill listed on her medication list

## 2016-06-17 NOTE — Telephone Encounter (Signed)
I do not see a fluid pill on pt's med list.

## 2016-06-17 NOTE — Telephone Encounter (Signed)
Breo changed to Symbicort per insurance  

## 2016-06-20 NOTE — Telephone Encounter (Signed)
Left message to call back  

## 2016-06-23 ENCOUNTER — Telehealth: Payer: Self-pay | Admitting: General Practice

## 2016-06-24 ENCOUNTER — Emergency Department (HOSPITAL_COMMUNITY)
Admission: EM | Admit: 2016-06-24 | Discharge: 2016-06-24 | Disposition: A | Discharge status: Home / Self Care | Payer: Medicaid Other | Attending: Emergency Medicine | Admitting: Emergency Medicine

## 2016-06-24 ENCOUNTER — Encounter (HOSPITAL_COMMUNITY): Payer: Self-pay | Admitting: Emergency Medicine

## 2016-06-24 ENCOUNTER — Emergency Department (HOSPITAL_COMMUNITY): Payer: Medicaid Other

## 2016-06-24 DIAGNOSIS — R0602 Shortness of breath: Secondary | ICD-10-CM | POA: Diagnosis present

## 2016-06-24 DIAGNOSIS — F17210 Nicotine dependence, cigarettes, uncomplicated: Secondary | ICD-10-CM | POA: Diagnosis not present

## 2016-06-24 DIAGNOSIS — Z79899 Other long term (current) drug therapy: Secondary | ICD-10-CM | POA: Insufficient documentation

## 2016-06-24 DIAGNOSIS — I10 Essential (primary) hypertension: Secondary | ICD-10-CM | POA: Diagnosis not present

## 2016-06-24 DIAGNOSIS — R109 Unspecified abdominal pain: Secondary | ICD-10-CM | POA: Diagnosis not present

## 2016-06-24 DIAGNOSIS — J40 Bronchitis, not specified as acute or chronic: Secondary | ICD-10-CM | POA: Insufficient documentation

## 2016-06-24 DIAGNOSIS — M25512 Pain in left shoulder: Secondary | ICD-10-CM | POA: Diagnosis not present

## 2016-06-24 LAB — COMPREHENSIVE METABOLIC PANEL
ALK PHOS: 127 U/L — AB (ref 38–126)
ALT: 37 U/L (ref 14–54)
AST: 74 U/L — AB (ref 15–41)
Albumin: 2.5 g/dL — ABNORMAL LOW (ref 3.5–5.0)
Anion gap: 4 — ABNORMAL LOW (ref 5–15)
BILIRUBIN TOTAL: 1.1 mg/dL (ref 0.3–1.2)
BUN: 8 mg/dL (ref 6–20)
CO2: 22 mmol/L (ref 22–32)
CREATININE: 0.57 mg/dL (ref 0.44–1.00)
Calcium: 8 mg/dL — ABNORMAL LOW (ref 8.9–10.3)
Chloride: 112 mmol/L — ABNORMAL HIGH (ref 101–111)
GFR calc Af Amer: >60 mL/min (ref >60)
Glucose, Bld: 95 mg/dL (ref 65–99)
Potassium: 3.7 mmol/L (ref 3.5–5.1)
Sodium: 138 mmol/L (ref 135–145)
TOTAL PROTEIN: 6.1 g/dL — AB (ref 6.5–8.1)

## 2016-06-24 LAB — CBC WITH DIFFERENTIAL/PLATELET
BASOS ABS: 0 10*3/uL (ref 0.0–0.1)
Basophils Relative: 1 %
EOS ABS: 0.2 10*3/uL (ref 0.0–0.7)
EOS PCT: 5 %
HCT: 28.1 % — ABNORMAL LOW (ref 36.0–46.0)
Hemoglobin: 8.9 g/dL — ABNORMAL LOW (ref 12.0–15.0)
Lymphocytes Relative: 23 %
Lymphs Abs: 0.8 10*3/uL (ref 0.7–4.0)
MCH: 25.6 pg — ABNORMAL LOW (ref 26.0–34.0)
MCHC: 31.7 g/dL (ref 30.0–36.0)
MCV: 80.7 fL (ref 78.0–100.0)
MONO ABS: 0.4 10*3/uL (ref 0.1–1.0)
Monocytes Relative: 11 %
Neutro Abs: 2.2 10*3/uL (ref 1.7–7.7)
Neutrophils Relative %: 60 %
PLATELETS: 58 10*3/uL — AB (ref 150–400)
RBC: 3.48 MIL/uL — AB (ref 3.87–5.11)
RDW: 17.7 % — AB (ref 11.5–15.5)
WBC: 3.6 10*3/uL — AB (ref 4.0–10.5)

## 2016-06-24 LAB — PROTIME-INR
INR: 1.61
Prothrombin Time: 19.3 seconds — ABNORMAL HIGH (ref 11.4–15.2)

## 2016-06-24 LAB — ETHANOL

## 2016-06-24 MED ORDER — DOXYCYCLINE HYCLATE 100 MG PO CAPS: 100.0000 mg | ORAL_CAPSULE | Freq: Two times a day (BID) | ORAL | 0 refills | Status: DC

## 2016-06-24 MED ORDER — DOXYCYCLINE HYCLATE 100 MG PO TABS
100.0000 mg | ORAL_TABLET | Freq: Once | ORAL | Status: AC
  Administered 2016-06-24: 100 mg via ORAL
  Filled 2016-06-24: qty 1

## 2016-06-24 NOTE — ED Triage Notes (Signed)
Patient states that she is drinking alcohol currently.

## 2016-06-24 NOTE — ED Provider Notes (Signed)
[facility] DEPT Provider Note   CSN: [id number] Arrival date & time: 06/24/16  1932  By signing my name below, I, Ephriam Jenkins, attest that this documentation has been prepared under the direction and in the presence of No att. providers found. Electronically signed, Ephriam Jenkins, ED Scribe. 06/24/16. 9:37 PM.   History   Chief Complaint Chief Complaint  Patient presents with  . Flank Pain    HPI HPI Comments: Kristen Marsh is a 60 y.o. female, COPD, Hepatitis C, Psoriasis, who presents to the Emergency Department complaining of constant left sided flank pain that radiates to the front of her abdomen, onset two days ago. The pain radiates intermittently. She also complains of dull left shoulder pain that has been present for quite some time. Pt states that she saw her PCP five days ago and reports that he liver enzymes have been normal. She also believes that her abdomen is mildly distended. When asked if she has been having regular bowel movements, pt replies "It varies every day". She also states that has felt mildly short of breath for the past two days, however, this is a common problem for her. Pt is a current smoker.  The history is provided by the patient. No language interpreter was used.    Past Medical History:  Diagnosis Date  . Anxiety   . Cirrhosis of liver ([place])   . Depression   . Emphysema   . Fibromyalgia   . GERD (gastroesophageal reflux disease)   . Hep C w/o coma, chronic ([place])   . Hypertension   . IBS (irritable bowel syndrome)   . Kidney stones   . MRSA carrier   . Osteoarthritis   . Pneumonia    As a child  . 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 Shortness of breath    with exertion  . Varicose vein    Of esophagus and top of stomach per pt    Patient Active Problem List   Diagnosis Date Noted  . MDD (major depressive disorder), recurrent severe, without psychosis ([place]) 05/18/2015  . Cocaine dependence with cocaine-induced mood disorder ([place])   .  Alcohol dependence with alcohol-induced mood disorder ([place])   . Polysubstance abuse 05/07/2015  . Ascites due to alcoholic cirrhosis ([place]) [id number]  . Ascites 02/28/2015  . GAD (generalized anxiety disorder) 10/11/2014  . IBS (irritable bowel syndrome) 10/11/2014  . COPD (chronic obstructive pulmonary disease) ([place]) 10/15/2013  . Alcoholism /alcohol abuse ([place]) 10/15/2013  . Cirrhosis of liver ([place]) 08/18/2013  . Degenerative cervical disc 08/18/2013  . Fibromyalgia 08/18/2013  . Muscle cramps 08/18/2013  . GERD (gastroesophageal reflux disease) 08/18/2013    Past Surgical History:  Procedure Laterality Date  . ABDOMINAL HYSTERECTOMY    . APPENDECTOMY    . COLONOSCOPY W/ BIOPSIES AND POLYPECTOMY    . DIAGNOSTIC LAPAROSCOPY     exploratory lap  . DIRECT LARYNGOSCOPY N/A 03/22/2014   Procedure: DIRECT LARYNGOSCOPY;  Surgeon: Ruby Cola, MD;  Location: [facility];  Service: ENT;  Laterality: N/A;  With biopsy  . DIRECT LARYNGOSCOPY N/A 01/15/2015   Procedure: DIRECT LARYNGOSCOPY WITH BIOPSY;  Surgeon: Leta Baptist, MD;  Location: [facility];  Service: ENT;  Laterality: N/A;  . ESOPHAGOGASTRODUODENOSCOPY (EGD) WITH PROPOFOL N/A 07/26/2014   Procedure: ESOPHAGOGASTRODUODENOSCOPY (EGD) WITH PROPOFOL;  Surgeon: Rogene Houston, MD;  Location: [facility];  Service: Endoscopy;  Laterality: N/A;  . HYSTEROSCOPY      OB History    No data available  Home Medications    Prior to Admission medications   Medication Sig Start Date End Date Taking? Authorizing Provider  albuterol (PROVENTIL) (2.5 MG/3ML) 0.083% nebulizer solution Take 3 mLs (2.5 mg total) by nebulization every 6 (six) hours as needed for wheezing or shortness of breath. 07/09/15  Yes Sharion Balloon, FNP  cyclobenzaprine (FLEXERIL) 5 MG tablet TAKE ONE TABLET BY MOUTH 3 TIMES DAILY AS NEEDED FOR MUSCLE SPASM(S). 06/05/16  Yes Sharion Balloon, FNP  dicyclomine (BENTYL) 20 MG tablet Take 1 tablet (20 mg total) by  mouth every 6 (six) hours. 06/05/16  Yes Christy A Hawks, FNP  ENULOSE 10 GM/15ML SOLN TAKE 15MLs BY MOUTH TWICE DAILY. 01/07/16  Yes Sharion Balloon, FNP  gabapentin (NEURONTIN) 400 MG capsule Take 1 capsule (400 mg total) by mouth 3 (three) times daily. 06/13/16  Yes Sharion Balloon, FNP  naproxen (NAPROSYN) 500 MG tablet Take 1 tablet (500 mg total) by mouth 2 (two) times daily with a meal. Patient taking differently: Take 500 mg by mouth daily as needed for mild pain or moderate pain.  06/05/16  Yes Sharion Balloon, FNP  omeprazole (PRILOSEC) 20 MG capsule Take 1 capsule (20 mg total) by mouth 2 (two) times daily. 06/05/16  Yes Sharion Balloon, FNP  Potassium Gluconate ([employer] POTASSIUM GLUCONATE) 2 MEQ TABS Take 595 mg by mouth 3 (three) times a week.    Yes Historical Provider, MD  PROVENTIL HFA 108 (90 Base) MCG/ACT inhaler INHALE 2 PUFFS INTO THE LUNGS EVERY FOUR HOURS AS NEEDED FOR WHEEZING. 06/16/16  Yes Claretta Fraise, MD  budesonide-formoterol [facility]) 80-4.5 MCG/ACT inhaler Inhale 2 puffs into the lungs 2 (two) times daily. Patient not taking: Reported on 06/24/2016 06/17/16   Sharion Balloon, FNP  doxycycline (VIBRAMYCIN) 100 MG capsule Take 1 capsule (100 mg total) by mouth 2 (two) times daily. One po bid x 7 days 06/24/16   Daleen Bo, MD  gabapentin (NEURONTIN) 300 MG capsule TAKE 1 CAPSULE BY MOUTH THREE TIMES A DAY. Patient not taking: Reported on 06/24/2016 06/16/16   Claretta Fraise, MD  lactulose (CHRONULAC) 10 GM/15ML solution TAKE 15MLS BY MOUTH TWICE DAILY. Patient not taking: Reported on 06/24/2016 06/05/16   Sharion Balloon, FNP    Family History Family History  Problem Relation Age of Onset  . Cancer Mother     esophageal  . Cancer Brother     Liver cancer and cirrhosis age 9    Social History Social History  Substance Use Topics  . Smoking status: Current Every Day Smoker    Packs/day: 0.50    Years: 40.00    Types: Cigarettes  . Smokeless tobacco: Never Used       Comment: 1/2 pack a day at least 40 yrs  . Alcohol use 2.4 oz/week    4 Cans of beer per week     Comment: 1 smirnoff a day.     Allergies   Codeine; Hydrocodone-acetaminophen; Meloxicam; and Montelukast   Review of Systems Review of Systems  Respiratory: Positive for shortness of breath.   Gastrointestinal: Positive for abdominal distention (mild).  Genitourinary: Positive for flank pain (left side radiating around to abdomen).  Musculoskeletal: Positive for arthralgias (left shoulder).  All other systems reviewed and are negative.    Physical Exam Updated Vital Signs BP 152/76 (BP Location: Left Arm)   Pulse 100   Temp 98.6 F (37 C) (Oral)   Resp 24   Ht 5\' 2"  (1.575 m)  Wt 149 lb (67.6 kg)   SpO2 100%   BMI 27.25 kg/m   Physical Exam  Constitutional: She is oriented to person, place, and time. She appears well-developed and well-nourished.  HENT:  Head: Normocephalic and atraumatic.  Eyes: Conjunctivae and EOM are normal. Pupils are equal, round, and reactive to light.  Neck: Normal range of motion and phonation normal. Neck supple.  Cardiovascular: Normal rate and regular rhythm.   Pulmonary/Chest: Effort normal. She has wheezes. She exhibits no tenderness.  Decreased air movement bilaterally. Diffuse scattered wheezes bilaterally.   Abdominal: Soft. She exhibits distension. There is no tenderness. There is no guarding.  Musculoskeletal: Normal range of motion.  Neurological: She is alert and oriented to person, place, and time. She exhibits normal muscle tone.  Skin: Skin is warm and dry.  Psychiatric: She has a normal mood and affect. Her behavior is normal. Judgment and thought content normal.  Nursing note and vitals reviewed.    ED Treatments / Results  DIAGNOSTIC STUDIES: Oxygen Saturation is 100% on RA, normal by my interpretation.  COORDINATION OF CARE: 9:21 PM-Will order lab work. Discussed treatment plan with pt at bedside and pt agreed to  plan.   Labs (all labs ordered are listed, but only abnormal results are displayed) Labs Reviewed  COMPREHENSIVE METABOLIC PANEL - Abnormal; Notable for the following:       Result Value   Chloride 112 (*)    Calcium 8.0 (*)    Total Protein 6.1 (*)    Albumin 2.5 (*)    AST 74 (*)    Alkaline Phosphatase 127 (*)    Anion gap 4 (*)    All other components within normal limits  CBC WITH DIFFERENTIAL/PLATELET - Abnormal; Notable for the following:    WBC 3.6 (*)    RBC 3.48 (*)    Hemoglobin 8.9 (*)    HCT 28.1 (*)    MCH 25.6 (*)    RDW 17.7 (*)    Platelets 58 (*)    All other components within normal limits  PROTIME-INR - Abnormal; Notable for the following:    Prothrombin Time 19.3 (*)    All other components within normal limits  ETHANOL   EKG  EKG Interpretation None       Radiology Dg Chest 2 View  Result Date: 06/24/2016 CLINICAL DATA:  Left shoulder problems with the catch in the left side. Burning and stinging across the shoulders. Abdominal swelling. EXAM: CHEST  2 VIEW COMPARISON:  05/06/2015 FINDINGS: The heart size and mediastinal contours are within normal limits. Both lungs are clear. The visualized skeletal structures are unremarkable. IMPRESSION: No active cardiopulmonary disease. Electronically Signed   By: Lucienne Capers M.D.   On: 06/24/2016 21:54    Procedures Procedures (including critical care time)  Medications Ordered in ED Medications  doxycycline (VIBRA-TABS) tablet 100 mg (100 mg Oral Given 06/24/16 2259)     Initial Impression / Assessment and Plan / ED Course  I have reviewed the triage vital signs and the nursing notes.  Pertinent labs & imaging results that were available during my care of the patient were reviewed by me and considered in my medical decision making (see chart for details).  Clinical Course     Medications  doxycycline (VIBRA-TABS) tablet 100 mg (100 mg Oral Given 06/24/16 2259)    Patient Vitals for the  past 24 hrs:  BP Temp Temp src Pulse Resp SpO2 Height Weight  06/24/16 2246 152/76 98.6 F (37  C) Oral 100 24 100 % - -  06/24/16 1952 - - - - - - 5\' 2"  (1.575 m) 149 lb (67.6 kg)  06/24/16 1951 140/65 98.2 F (36.8 C) Oral 101 18 100 % - -    At D/C Reevaluation with update and discussion. After initial assessment and treatment, an updated evaluation reveals distant complains. Findings discussed. All questions answered. Kristen Marsh L    Final Clinical Impressions(s) / ED Diagnoses   Final diagnoses:  Bronchitis  Left flank pain    Nonspecific left flank pain, with cough. Suspect muscle strain. Doubt acute liver failure, PE, pneumonia, or metabolic instability.  Nursing Notes Reviewed/ Care Coordinated Applicable Imaging Reviewed Interpretation of Laboratory Data incorporated into ED treatment  The patient appears reasonably screened and/or stabilized for discharge and I doubt any other medical condition or other [facility] requiring further screening, evaluation, or treatment in the ED at this time prior to discharge.  Plan: Home Medications- continue; Home Treatments- stop EtOH and smoking; return here if the recommended treatment, does not improve the symptoms; Recommended follow up- PCP 1 wwek and prn   New Prescriptions Discharge Medication List as of 06/24/2016 10:47 PM    START taking these medications   Details  doxycycline (VIBRAMYCIN) 100 MG capsule Take 1 capsule (100 mg total) by mouth 2 (two) times daily. One po bid x 7 days, Starting Tue 06/24/2016, Print      I personally performed the services described in this documentation, which was scribed in my presence. The recorded information has been reviewed and is accurate.    Daleen Bo, MD 06/24/16 [phone]

## 2016-06-24 NOTE — ED Triage Notes (Signed)
Patient states that she is having left shoulder problems and a catch in her left side.  States that she is having burning and stinging across her shoulders.  States that she is on lactulose and that it is not working right.  States that she is not going to the bathroom well and that she is swelling in her abdomen.  States that she would like to have her fluid drawn off of her abdomen.

## 2016-06-24 NOTE — Discharge Instructions (Signed)
Your cough and pain are likely secondary to bronchitis.  Your liver, appears stable.  Try to stop smoking cigarettes and drinking alcohol.  For your pain, take ibuprofen 400 mg 3 times a day, and use heat on the sore area 3 or 4 times a day.

## 2016-06-25 NOTE — Telephone Encounter (Signed)
Pt needs several things:  Pt wants to try Lyrica - gabapentin don't help Needs zyrtec rx sent in [place] - went to ER and pain is not going away - they rx'd her a antibiotic and she hasnt started it yet.

## 2016-06-25 NOTE — Telephone Encounter (Signed)
This pt was suppose to be dismissed. I have refilled all these medications. Pt will need to follow up with a new PCP. I have had multiple medication request and have refill all of the medications.

## 2016-06-27 ENCOUNTER — Other Ambulatory Visit: Payer: Self-pay | Admitting: *Deleted

## 2016-06-27 ENCOUNTER — Other Ambulatory Visit: Payer: Self-pay | Admitting: Family

## 2016-06-27 NOTE — Telephone Encounter (Signed)
This call has been taken care of in another encounter

## 2016-08-25 ENCOUNTER — Other Ambulatory Visit: Payer: Self-pay | Admitting: Family

## 2016-08-26 MED ORDER — LACTULOSE 10 GM/15ML PO SOLN: ORAL | 5 refills | Status: DC

## 2016-08-26 NOTE — Telephone Encounter (Signed)
Pt aware.

## 2016-08-26 NOTE — Telephone Encounter (Signed)
Refill sent to pharmacy. Please keep follow up appt with new PCP. Thanks!

## 2016-08-26 NOTE — Addendum Note (Signed)
Addended by: Zannie Cove on: 08/26/2016 09:09 AM   Modules accepted: Orders

## 2016-09-08 ENCOUNTER — Other Ambulatory Visit: Payer: Self-pay | Admitting: Family

## 2016-09-08 DIAGNOSIS — M797 Fibromyalgia: Secondary | ICD-10-CM

## 2016-09-08 DIAGNOSIS — M546 Pain in thoracic spine: Secondary | ICD-10-CM

## 2016-09-08 DIAGNOSIS — G8929 Other chronic pain: Secondary | ICD-10-CM

## 2017-07-07 ENCOUNTER — Other Ambulatory Visit: Payer: Self-pay | Admitting: Family Medicine

## 2018-01-03 ENCOUNTER — Inpatient Hospital Stay (HOSPITAL_COMMUNITY)
Admission: EM | Admit: 2018-01-03 | Discharge: 2018-01-05 | DRG: 603 | Disposition: A | Discharge status: Home / Self Care | Payer: Medicaid Other | Attending: Internal Medicine | Admitting: Internal Medicine

## 2018-01-03 ENCOUNTER — Other Ambulatory Visit: Payer: Self-pay

## 2018-01-03 ENCOUNTER — Emergency Department (HOSPITAL_COMMUNITY): Payer: Medicaid Other

## 2018-01-03 ENCOUNTER — Encounter (HOSPITAL_COMMUNITY): Payer: Self-pay

## 2018-01-03 DIAGNOSIS — IMO0001 Reserved for inherently not codable concepts without codable children: Secondary | ICD-10-CM | POA: Diagnosis present

## 2018-01-03 DIAGNOSIS — I8390 Asymptomatic varicose veins of unspecified lower extremity: Secondary | ICD-10-CM | POA: Diagnosis present

## 2018-01-03 DIAGNOSIS — Z792 Long term (current) use of antibiotics: Secondary | ICD-10-CM | POA: Diagnosis not present

## 2018-01-03 DIAGNOSIS — F17210 Nicotine dependence, cigarettes, uncomplicated: Secondary | ICD-10-CM | POA: Diagnosis present

## 2018-01-03 DIAGNOSIS — Z800 Family history of malignant neoplasm of digestive organs: Secondary | ICD-10-CM

## 2018-01-03 DIAGNOSIS — Z791 Long term (current) use of non-steroidal anti-inflammatories (NSAID): Secondary | ICD-10-CM

## 2018-01-03 DIAGNOSIS — F419 Anxiety disorder, unspecified: Secondary | ICD-10-CM | POA: Diagnosis present

## 2018-01-03 DIAGNOSIS — L03115 Cellulitis of right lower limb: Secondary | ICD-10-CM | POA: Diagnosis present

## 2018-01-03 DIAGNOSIS — L0390 Cellulitis, unspecified: Secondary | ICD-10-CM | POA: Diagnosis present

## 2018-01-03 DIAGNOSIS — Z22322 Carrier or suspected carrier of Methicillin resistant Staphylococcus aureus: Secondary | ICD-10-CM | POA: Diagnosis not present

## 2018-01-03 DIAGNOSIS — Z888 Allergy status to other drugs, medicaments and biological substances status: Secondary | ICD-10-CM

## 2018-01-03 DIAGNOSIS — F1910 Other psychoactive substance abuse, uncomplicated: Secondary | ICD-10-CM | POA: Diagnosis not present

## 2018-01-03 DIAGNOSIS — L409 Psoriasis, unspecified: Secondary | ICD-10-CM | POA: Diagnosis present

## 2018-01-03 DIAGNOSIS — G629 Polyneuropathy, unspecified: Secondary | ICD-10-CM | POA: Diagnosis present

## 2018-01-03 DIAGNOSIS — M109 Gout, unspecified: Secondary | ICD-10-CM | POA: Diagnosis present

## 2018-01-03 DIAGNOSIS — J439 Emphysema, unspecified: Secondary | ICD-10-CM | POA: Diagnosis present

## 2018-01-03 DIAGNOSIS — I10 Essential (primary) hypertension: Secondary | ICD-10-CM | POA: Diagnosis present

## 2018-01-03 DIAGNOSIS — K219 Gastro-esophageal reflux disease without esophagitis: Secondary | ICD-10-CM | POA: Diagnosis present

## 2018-01-03 DIAGNOSIS — M542 Cervicalgia: Secondary | ICD-10-CM

## 2018-01-03 DIAGNOSIS — Z23 Encounter for immunization: Secondary | ICD-10-CM

## 2018-01-03 DIAGNOSIS — R7989 Other specified abnormal findings of blood chemistry: Secondary | ICD-10-CM

## 2018-01-03 DIAGNOSIS — Z7151 Drug abuse counseling and surveillance of drug abuser: Secondary | ICD-10-CM

## 2018-01-03 DIAGNOSIS — M1990 Unspecified osteoarthritis, unspecified site: Secondary | ICD-10-CM | POA: Diagnosis present

## 2018-01-03 DIAGNOSIS — Z7951 Long term (current) use of inhaled steroids: Secondary | ICD-10-CM

## 2018-01-03 DIAGNOSIS — Z885 Allergy status to narcotic agent status: Secondary | ICD-10-CM

## 2018-01-03 DIAGNOSIS — K589 Irritable bowel syndrome without diarrhea: Secondary | ICD-10-CM | POA: Diagnosis present

## 2018-01-03 DIAGNOSIS — K7460 Unspecified cirrhosis of liver: Secondary | ICD-10-CM | POA: Diagnosis present

## 2018-01-03 DIAGNOSIS — E876 Hypokalemia: Secondary | ICD-10-CM | POA: Diagnosis present

## 2018-01-03 DIAGNOSIS — M797 Fibromyalgia: Secondary | ICD-10-CM | POA: Diagnosis present

## 2018-01-03 DIAGNOSIS — F1020 Alcohol dependence, uncomplicated: Secondary | ICD-10-CM | POA: Diagnosis not present

## 2018-01-03 DIAGNOSIS — F1424 Cocaine dependence with cocaine-induced mood disorder: Secondary | ICD-10-CM | POA: Diagnosis not present

## 2018-01-03 DIAGNOSIS — K7469 Other cirrhosis of liver: Secondary | ICD-10-CM | POA: Diagnosis not present

## 2018-01-03 DIAGNOSIS — Z881 Allergy status to other antibiotic agents status: Secondary | ICD-10-CM

## 2018-01-03 DIAGNOSIS — F329 Major depressive disorder, single episode, unspecified: Secondary | ICD-10-CM | POA: Diagnosis present

## 2018-01-03 DIAGNOSIS — B182 Chronic viral hepatitis C: Secondary | ICD-10-CM | POA: Diagnosis present

## 2018-01-03 DIAGNOSIS — Z87442 Personal history of urinary calculi: Secondary | ICD-10-CM

## 2018-01-03 DIAGNOSIS — Z90710 Acquired absence of both cervix and uterus: Secondary | ICD-10-CM

## 2018-01-03 DIAGNOSIS — Z79899 Other long term (current) drug therapy: Secondary | ICD-10-CM

## 2018-01-03 DIAGNOSIS — G8929 Other chronic pain: Secondary | ICD-10-CM

## 2018-01-03 LAB — RAPID URINE DRUG SCREEN, HOSP PERFORMED
Amphetamines: POSITIVE — AB
BARBITURATES: NOT DETECTED
BENZODIAZEPINES: POSITIVE — AB
COCAINE: POSITIVE — AB
Opiates: NOT DETECTED
Tetrahydrocannabinol: NOT DETECTED

## 2018-01-03 LAB — MRSA PCR SCREENING: MRSA BY PCR: NEGATIVE

## 2018-01-03 LAB — CBC
HCT: 37 % (ref 36.0–46.0)
Hemoglobin: 12.3 g/dL (ref 12.0–15.0)
MCH: 28.7 pg (ref 26.0–34.0)
MCHC: 33.2 g/dL (ref 30.0–36.0)
MCV: 86.4 fL (ref 78.0–100.0)
PLATELETS: 103 10*3/uL — AB (ref 150–400)
RBC: 4.28 MIL/uL (ref 3.87–5.11)
RDW: 15.5 % (ref 11.5–15.5)
WBC: 12 10*3/uL — AB (ref 4.0–10.5)

## 2018-01-03 LAB — AMMONIA: Ammonia: 100 umol/L — ABNORMAL HIGH (ref 9–35)

## 2018-01-03 LAB — COMPREHENSIVE METABOLIC PANEL
ALT: 51 U/L (ref 14–54)
AST: 81 U/L — AB (ref 15–41)
Albumin: 3.5 g/dL (ref 3.5–5.0)
Alkaline Phosphatase: 69 U/L (ref 38–126)
Anion gap: 6 (ref 5–15)
BUN: 11 mg/dL (ref 6–20)
CHLORIDE: 104 mmol/L (ref 101–111)
CO2: 28 mmol/L (ref 22–32)
CREATININE: 0.57 mg/dL (ref 0.44–1.00)
Calcium: 8.6 mg/dL — ABNORMAL LOW (ref 8.9–10.3)
GFR calc non Af Amer: >60 mL/min (ref >60)
Glucose, Bld: 128 mg/dL — ABNORMAL HIGH (ref 65–99)
Potassium: 3 mmol/L — ABNORMAL LOW (ref 3.5–5.1)
SODIUM: 138 mmol/L (ref 135–145)
Total Bilirubin: 2.1 mg/dL — ABNORMAL HIGH (ref 0.3–1.2)
Total Protein: 6.9 g/dL (ref 6.5–8.1)

## 2018-01-03 LAB — URIC ACID: Uric Acid, Serum: 4.3 mg/dL (ref 2.3–6.6)

## 2018-01-03 LAB — ETHANOL: Alcohol, Ethyl (B): <10 mg/dL (ref <10)

## 2018-01-03 LAB — C-REACTIVE PROTEIN: CRP: 4.4 mg/dL — ABNORMAL HIGH (ref <1.0)

## 2018-01-03 LAB — SEDIMENTATION RATE: SED RATE: 10 mm/h (ref 0–22)

## 2018-01-03 MED ORDER — LORATADINE 10 MG PO TABS
10.0000 mg | ORAL_TABLET | Freq: Every day | ORAL | Status: DC
  Administered 2018-01-03 – 2018-01-05 (×3): 10 mg via ORAL
  Filled 2018-01-03 (×3): qty 1

## 2018-01-03 MED ORDER — MOMETASONE FURO-FORMOTEROL FUM 100-5 MCG/ACT IN AERO
2.0000 | INHALATION_SPRAY | Freq: Two times a day (BID) | RESPIRATORY_TRACT | Status: DC
  Administered 2018-01-03 – 2018-01-05 (×5): 2 via RESPIRATORY_TRACT
  Filled 2018-01-03: qty 8.8

## 2018-01-03 MED ORDER — POTASSIUM CHLORIDE CRYS ER 20 MEQ PO TBCR
40.0000 meq | EXTENDED_RELEASE_TABLET | Freq: Once | ORAL | Status: AC
  Administered 2018-01-03: 40 meq via ORAL
  Filled 2018-01-03: qty 2

## 2018-01-03 MED ORDER — ONDANSETRON HCL 4 MG PO TABS: 4.0000 mg | ORAL_TABLET | Freq: Four times a day (QID) | ORAL | Status: DC | PRN

## 2018-01-03 MED ORDER — SODIUM CHLORIDE 0.9 % IV SOLN: 250.0000 mL | INTRAVENOUS | Status: DC | PRN

## 2018-01-03 MED ORDER — OXYCODONE HCL 5 MG PO TABS: 5.0000 mg | ORAL_TABLET | Freq: Four times a day (QID) | ORAL | Status: DC | PRN

## 2018-01-03 MED ORDER — IBUPROFEN 600 MG PO TABS
600.0000 mg | ORAL_TABLET | Freq: Three times a day (TID) | ORAL | Status: AC
  Administered 2018-01-03 – 2018-01-04 (×6): 600 mg via ORAL
  Filled 2018-01-03 (×6): qty 1

## 2018-01-03 MED ORDER — ONDANSETRON HCL 4 MG/2ML IJ SOLN: 4.0000 mg | Freq: Four times a day (QID) | INTRAMUSCULAR | Status: DC | PRN

## 2018-01-03 MED ORDER — LORAZEPAM 1 MG PO TABS
1.0000 mg | ORAL_TABLET | Freq: Four times a day (QID) | ORAL | Status: DC | PRN
  Administered 2018-01-03 – 2018-01-04 (×2): 1 mg via ORAL
  Filled 2018-01-03 (×2): qty 1

## 2018-01-03 MED ORDER — VANCOMYCIN HCL IN DEXTROSE 750-5 MG/150ML-% IV SOLN
750.0000 mg | Freq: Two times a day (BID) | INTRAVENOUS | Status: DC
  Administered 2018-01-03 – 2018-01-04 (×3): 750 mg via INTRAVENOUS
  Filled 2018-01-03 (×4): qty 150

## 2018-01-03 MED ORDER — THIAMINE HCL 100 MG/ML IJ SOLN: 100.0000 mg | Freq: Every day | INTRAMUSCULAR | Status: DC

## 2018-01-03 MED ORDER — PNEUMOCOCCAL VAC POLYVALENT 25 MCG/0.5ML IJ INJ
0.5000 mL | INJECTION | INTRAMUSCULAR | Status: AC
  Administered 2018-01-04: 0.5 mL via INTRAMUSCULAR
  Filled 2018-01-03: qty 0.5

## 2018-01-03 MED ORDER — SODIUM CHLORIDE 0.9% FLUSH
3.0000 mL | Freq: Two times a day (BID) | INTRAVENOUS | Status: DC
  Administered 2018-01-03 – 2018-01-05 (×5): 3 mL via INTRAVENOUS

## 2018-01-03 MED ORDER — CYCLOBENZAPRINE HCL 10 MG PO TABS
5.0000 mg | ORAL_TABLET | Freq: Three times a day (TID) | ORAL | Status: DC | PRN
  Administered 2018-01-04: 5 mg via ORAL
  Filled 2018-01-03: qty 1

## 2018-01-03 MED ORDER — ADULT MULTIVITAMIN W/MINERALS CH
1.0000 | ORAL_TABLET | Freq: Every day | ORAL | Status: DC
  Administered 2018-01-03 – 2018-01-05 (×3): 1 via ORAL
  Filled 2018-01-03 (×3): qty 1

## 2018-01-03 MED ORDER — ALBUTEROL SULFATE (2.5 MG/3ML) 0.083% IN NEBU: 2.5000 mg | INHALATION_SOLUTION | Freq: Four times a day (QID) | RESPIRATORY_TRACT | Status: DC | PRN

## 2018-01-03 MED ORDER — SODIUM CHLORIDE 0.9% FLUSH: 3.0000 mL | INTRAVENOUS | Status: DC | PRN

## 2018-01-03 MED ORDER — LACTULOSE 10 GM/15ML PO SOLN
20.0000 g | Freq: Two times a day (BID) | ORAL | Status: DC
  Administered 2018-01-03 – 2018-01-05 (×5): 20 g via ORAL
  Filled 2018-01-03 (×5): qty 30

## 2018-01-03 MED ORDER — FOLIC ACID 1 MG PO TABS
1.0000 mg | ORAL_TABLET | Freq: Every day | ORAL | Status: DC
  Administered 2018-01-03 – 2018-01-05 (×3): 1 mg via ORAL
  Filled 2018-01-03 (×3): qty 1

## 2018-01-03 MED ORDER — DEXAMETHASONE SODIUM PHOSPHATE 10 MG/ML IJ SOLN
10.0000 mg | Freq: Once | INTRAMUSCULAR | Status: AC
  Administered 2018-01-03: 10 mg via INTRAVENOUS
  Filled 2018-01-03: qty 1

## 2018-01-03 MED ORDER — ACETAMINOPHEN 325 MG PO TABS
650.0000 mg | ORAL_TABLET | Freq: Four times a day (QID) | ORAL | Status: DC | PRN
  Administered 2018-01-03 – 2018-01-04 (×2): 650 mg via ORAL
  Filled 2018-01-03 (×2): qty 2

## 2018-01-03 MED ORDER — VANCOMYCIN HCL IN DEXTROSE 1-5 GM/200ML-% IV SOLN
1000.0000 mg | Freq: Once | INTRAVENOUS | Status: AC
  Administered 2018-01-03: 1000 mg via INTRAVENOUS
  Filled 2018-01-03: qty 200

## 2018-01-03 MED ORDER — LORAZEPAM 2 MG/ML IJ SOLN: 1.0000 mg | Freq: Four times a day (QID) | INTRAMUSCULAR | Status: DC | PRN

## 2018-01-03 MED ORDER — PANTOPRAZOLE SODIUM 40 MG PO TBEC
40.0000 mg | DELAYED_RELEASE_TABLET | Freq: Every day | ORAL | Status: DC
Start: 2018-01-03 — End: 2018-01-05
  Administered 2018-01-03 – 2018-01-05 (×3): 40 mg via ORAL
  Filled 2018-01-03 (×3): qty 1

## 2018-01-03 MED ORDER — ACETAMINOPHEN 650 MG RE SUPP: 650.0000 mg | Freq: Four times a day (QID) | RECTAL | Status: DC | PRN

## 2018-01-03 MED ORDER — VITAMIN B-1 100 MG PO TABS
100.0000 mg | ORAL_TABLET | Freq: Every day | ORAL | Status: DC
  Administered 2018-01-03 – 2018-01-05 (×3): 100 mg via ORAL
  Filled 2018-01-03 (×3): qty 1

## 2018-01-03 MED ORDER — PIPERACILLIN-TAZOBACTAM 3.375 G IVPB 30 MIN
3.3750 g | Freq: Once | INTRAVENOUS | Status: AC
  Administered 2018-01-03: 3.375 g via INTRAVENOUS
  Filled 2018-01-03: qty 50

## 2018-01-03 MED ORDER — DIPHENHYDRAMINE HCL 50 MG/ML IJ SOLN
25.0000 mg | Freq: Once | INTRAMUSCULAR | Status: AC
  Administered 2018-01-03: 25 mg via INTRAVENOUS
  Filled 2018-01-03: qty 1

## 2018-01-03 NOTE — ED Notes (Signed)
Report given to Aldona Bar will transfer after 730

## 2018-01-03 NOTE — ED Provider Notes (Signed)
[facility] EMERGENCY DEPARTMENT Provider Note   CSN: [id number] Arrival date & time: 01/03/18  0217  Time seen 02:35 AM   History   Chief Complaint Chief Complaint  Patient presents with  . Foot Injury    Rt    HPI Kristen Marsh is a 62 y.o. female.  HPI patient states she thinks she stepped on something a few days ago.  She is not sure what.  She thinks her cat knocked over a glass and she may have stepped on that but she is not sure.  She states it started slowly getting swollen about 2 days ago and is getting more painful.  She denies fever or chills.  She denies to had any draining.  She relates she has had a bunion in that foot.  She states her tetanus is up-to-date.  Patient presents tonight via EMS because she states she is not able to walk on that foot.   Patient states she ran out of her lactulose a week ago.  She has an appointment to see her GI doctor on June 4.  PCP Patient, No Pcp Per GI at [facility]   Past Medical History:  Diagnosis Date  . Anxiety   . Cirrhosis of liver ([place])   . Depression   . Emphysema   . Fibromyalgia   . GERD (gastroesophageal reflux disease)   . Hep C w/o coma, chronic ([place])   . Hypertension   . IBS (irritable bowel syndrome)   . Kidney stones   . MRSA carrier   . Osteoarthritis   . Pneumonia    As a child  . 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 Shortness of breath    with exertion  . Varicose vein    Of esophagus and top of stomach per pt    Patient Active Problem List   Diagnosis Date Noted  . MDD (major depressive disorder), recurrent severe, without psychosis ([place]) 05/18/2015  . Cocaine dependence with cocaine-induced mood disorder ([place])   . Alcohol dependence with alcohol-induced mood disorder ([place])   . Polysubstance abuse ([place]) 05/07/2015  . Ascites due to alcoholic cirrhosis ([place]) 01/60/1093  . Ascites 02/28/2015  . GAD (generalized anxiety disorder) 10/11/2014  . IBS (irritable bowel syndrome) 10/11/2014  . COPD  (chronic obstructive pulmonary disease) ([place]) 10/15/2013  . Alcoholism /alcohol abuse ([place]) 10/15/2013  . Cirrhosis of liver ([place]) 08/18/2013  . Degenerative cervical disc 08/18/2013  . Fibromyalgia 08/18/2013  . Muscle cramps 08/18/2013  . GERD (gastroesophageal reflux disease) 08/18/2013    Past Surgical History:  Procedure Laterality Date  . ABDOMINAL HYSTERECTOMY    . APPENDECTOMY    . COLONOSCOPY W/ BIOPSIES AND POLYPECTOMY    . DIAGNOSTIC LAPAROSCOPY     exploratory lap  . DIRECT LARYNGOSCOPY N/A 03/22/2014   Procedure: DIRECT LARYNGOSCOPY;  Surgeon: Ruby Cola, MD;  Location: [place];  Service: ENT;  Laterality: N/A;  With biopsy  . DIRECT LARYNGOSCOPY N/A 01/15/2015   Procedure: DIRECT LARYNGOSCOPY WITH BIOPSY;  Surgeon: Leta Baptist, MD;  Location: [place];  Service: ENT;  Laterality: N/A;  . ESOPHAGOGASTRODUODENOSCOPY (EGD) WITH PROPOFOL N/A 07/26/2014   Procedure: ESOPHAGOGASTRODUODENOSCOPY (EGD) WITH PROPOFOL;  Surgeon: Rogene Houston, MD;  Location: [facility];  Service: Endoscopy;  Laterality: N/A;  . HYSTEROSCOPY       OB History   None      Home Medications    Prior to Admission medications   Medication Sig Start Date End Date  Taking? Authorizing Provider  albuterol (PROVENTIL) (2.5 MG/3ML) 0.083% nebulizer solution Take 3 mLs (2.5 mg total) by nebulization every 6 (six) hours as needed for wheezing or shortness of breath. 07/09/15   Sharion Balloon, FNP  budesonide-formoterol (SYMBICORT) 80-4.5 MCG/ACT inhaler Inhale 2 puffs into the lungs 2 (two) times daily. Patient not taking: Reported on 06/24/2016 06/17/16   Evelina Dun A, FNP  cetirizine (ZYRTEC) 10 MG tablet TAKE ONE TABLET BY MOUTH ONCE DAILY. 06/30/16   Sharion Balloon, FNP  cyclobenzaprine (FLEXERIL) 5 MG tablet TAKE ONE TABLET BY MOUTH 3 TIMES DAILY AS NEEDED FOR MUSCLE SPASM(S). 06/05/16   Sharion Balloon, FNP  dicyclomine (BENTYL) 20 MG tablet Take 1 tablet (20 mg total) by mouth  every 6 (six) hours. 06/05/16   Sharion Balloon, FNP  doxycycline (VIBRAMYCIN) 100 MG capsule Take 1 capsule (100 mg total) by mouth 2 (two) times daily. One po bid x 7 days 06/24/16   Daleen Bo, MD  ENULOSE 10 GM/15ML SOLN TAKE 15MLs BY MOUTH TWICE DAILY. 01/07/16   Evelina Dun A, FNP  gabapentin (NEURONTIN) 300 MG capsule TAKE 1 CAPSULE BY MOUTH THREE TIMES A DAY. Patient not taking: Reported on 06/24/2016 06/16/16   Claretta Fraise, MD  gabapentin (NEURONTIN) 400 MG capsule TAKE ONE CAPSULE BY MOUTH 3 TIMES A DAY. 09/08/16   Hawks, Christy A, FNP  lactulose (CHRONULAC) 10 GM/15ML solution TAKE 15MLS BY MOUTH TWICE DAILY. 08/26/16   Sharion Balloon, FNP  naproxen (NAPROSYN) 500 MG tablet Take 1 tablet (500 mg total) by mouth 2 (two) times daily with a meal. Patient taking differently: Take 500 mg by mouth daily as needed for mild pain or moderate pain.  06/05/16   Sharion Balloon, FNP  omeprazole (PRILOSEC) 20 MG capsule Take 1 capsule (20 mg total) by mouth 2 (two) times daily. 06/05/16   Evelina Dun A, FNP  Potassium Gluconate ([employer]S POTASSIUM GLUCONATE) 2 MEQ TABS Take 595 mg by mouth 3 (three) times a week.     [provider]  PROVENTIL HFA 108 (90 Base) MCG/ACT inhaler INHALE 2 PUFFS INTO THE LUNGS EVERY FOUR HOURS AS NEEDED FOR WHEEZING. 06/16/16   Claretta Fraise, MD    Family History Family History  Problem Relation Age of Onset  . Cancer Mother        esophageal  . Cancer Brother        Liver cancer and cirrhosis age 80    Social History Social History   Tobacco Use  . Smoking status: Current Every Day Smoker    Packs/day: 0.50    Years: 40.00    Pack years: 20.00    Types: Cigarettes  . Smokeless tobacco: Never Used  . Tobacco comment: 1/2 pack a day at least 40 yrs  Substance Use Topics  . Alcohol use: Yes    Alcohol/week: 2.4 oz    Types: 4 Cans of beer per week    Comment: 1 smirnoff a day.   . Drug use: Yes    Types: Cocaine, "Crack" cocaine     Comment: Haven't used cocaine in the last 3 years  on disability for COPD Drinks alcohol 24 ounces several days a week, none tonight States she is recovering from cocaine addiction then states she used last week   Allergies   Codeine; Hydrocodone-acetaminophen; Meloxicam; and Montelukast   Review of Systems Review of Systems  All other systems reviewed and are negative.    Physical Exam Updated Vital Signs  BP 114/72 (BP Location: Left Arm)   Pulse 77   Temp 98.8 F (37.1 C) (Oral)   Resp (!) 22   SpO2 98%   Vital signs normal    Physical Exam  Constitutional: She is oriented to person, place, and time. She appears well-developed and well-nourished.  Pt is very figidty, cannot sit still, mumbles  HENT:  Head: Normocephalic and atraumatic.  Right Ear: External ear normal.  Left Ear: External ear normal.  Nose: Nose normal.  Eyes: Conjunctivae and EOM are normal.  Neck: Normal range of motion.  Cardiovascular: Normal rate.  Pulmonary/Chest: Effort normal. No stridor.  Musculoskeletal: She exhibits edema and tenderness.  Patient has redness on the sole of her foot and a lot of redness around the  MTP joint of the right great toe.  Neurological: She is alert and oriented to person, place, and time. No cranial nerve deficit.  Skin: Skin is warm and dry. No rash noted.  Patient appears to have a linear laceration near the MTP of the second toe that may have been the original injury site.  There is no drainage from the area.  Psychiatric: Her mood appears anxious. Her speech is rapid and/or pressured. She is agitated.  Speaks softly and mumbles  Nursing note and vitals reviewed.        ED Treatments / Results  Labs (all labs ordered are listed, but only abnormal results are displayed) Results for orders placed or performed during the hospital encounter of 01/03/18  CBC  Result Value Ref Range   WBC 12.0 (H) 4.0 - 10.5 K/uL   RBC 4.28 3.87 - 5.11 MIL/uL    Hemoglobin 12.3 12.0 - 15.0 g/dL   HCT 37.0 36.0 - 46.0 %   MCV 86.4 78.0 - 100.0 fL   MCH 28.7 26.0 - 34.0 pg   MCHC 33.2 30.0 - 36.0 g/dL   RDW 15.5 11.5 - 15.5 %   Platelets 103 (L) 150 - 400 K/uL  Comprehensive metabolic panel  Result Value Ref Range   Sodium 138 135 - 145 mmol/L   Potassium 3.0 (L) 3.5 - 5.1 mmol/L   Chloride 104 101 - 111 mmol/L   CO2 28 22 - 32 mmol/L   Glucose, Bld 128 (H) 65 - 99 mg/dL   BUN 11 6 - 20 mg/dL   Creatinine, Ser 0.57 0.44 - 1.00 mg/dL   Calcium 8.6 (L) 8.9 - 10.3 mg/dL   Total Protein 6.9 6.5 - 8.1 g/dL   Albumin 3.5 3.5 - 5.0 g/dL   AST 81 (H) 15 - 41 U/L   ALT 51 14 - 54 U/L   Alkaline Phosphatase 69 38 - 126 U/L   Total Bilirubin 2.1 (H) 0.3 - 1.2 mg/dL   GFR calc non Af Amer >60 >60 mL/min   GFR calc Af Amer >60 >60 mL/min   Anion gap 6 5 - 15  Ammonia  Result Value Ref Range   Ammonia 100 (H) 9 - 35 umol/L  Sedimentation rate  Result Value Ref Range   Sed Rate 10 0 - 22 mm/hr  Urine rapid drug screen (hosp performed)  Result Value Ref Range   Opiates NONE DETECTED NONE DETECTED   Cocaine POSITIVE (A) NONE DETECTED   Benzodiazepines POSITIVE (A) NONE DETECTED   Amphetamines POSITIVE (A) NONE DETECTED   Tetrahydrocannabinol NONE DETECTED NONE DETECTED   Barbiturates NONE DETECTED NONE DETECTED  Ethanol  Result Value Ref Range   Alcohol, Ethyl (B) <10 <10 mg/dL  Uric acid  Result Value Ref Range   Uric Acid, Serum 4.3 2.3 - 6.6 mg/dL   Laboratory interpretation all normal except leukocytosis, chronic thrombocytopenia, very positive UDS, hypokalemia, elevated ammonia consistent with her stating she ran out of her lactulose week ago    EKG None  Radiology Dg Foot Complete Right  Result Date: 01/03/2018 CLINICAL DATA:  Stepped on unknown object, with right great toe erythema and pain. Initial encounter. EXAM: RIGHT FOOT COMPLETE - 3+ VIEW COMPARISON:  None. FINDINGS: There is no evidence of fracture or dislocation.  Subcortical cystic change at the distal first metatarsal is of uncertain significance. Would correlate clinically to exclude gout. The joint spaces are preserved. There is no evidence of talar subluxation; the subtalar joint is unremarkable in appearance. A plantar calcaneal spur is noted. Soft tissue swelling is noted about the great toe. No radiopaque foreign bodies are seen. IMPRESSION: 1. No evidence of fracture or dislocation. 2. Subcortical cystic change at the distal first metatarsal is of uncertain significance. Would correlate clinically to exclude gout. 3. Soft tissue swelling about the great toe. No radiopaque foreign bodies seen. Electronically Signed   By: Garald Balding M.D.   On: 01/03/2018 03:52    Procedures Procedures (including critical care time)  Medications Ordered in ED Medications  vancomycin (VANCOCIN) IVPB 1000 mg/200 mL premix (1,000 mg Intravenous New Bag/Given 01/03/18 0506)  piperacillin-tazobactam (ZOSYN) IVPB 3.375 g (0 g Intravenous Stopped 01/03/18 0507)  dexamethasone (DECADRON) injection 10 mg (10 mg Intravenous Given 01/03/18 0428)  potassium chloride SA (K-DUR,KLOR-CON) CR tablet 40 mEq (40 mEq Oral Given 01/03/18 0506)  diphenhydrAMINE (BENADRYL) injection 25 mg (25 mg Intravenous Given 01/03/18 0539)     Initial Impression / Assessment and Plan / ED Course  I have reviewed the triage vital signs and the nursing notes.  Pertinent labs & imaging results that were available during my care of the patient were reviewed by me and considered in my medical decision making (see chart for details).   Laboratory testing was done including a uric acid to check for gout, sed rate and CRP to look for suggestion of osteomyelitis, and ammonia level since she has not been taking her lactulose.  When I look at the patient's picture that is on her chart compared to the patient now she has had a pretty severe weight loss.  She states she has lost about 71 pounds.  After reviewing  her laboratory test results and her x-rays she was started on IV Zosyn and vancomycin for her cellulitis.  She was given IV Toradol for pain.  She was also given IV Decadron as a anti-inflammatory and possible underlying gout.  Recheck at 4:40 AM patient is sound asleep, as soon as she is awake and she is fidgeting in the bed.  She still has low mumbling speech.  We discussed her test results that have resulted so far, her sed rate and CRP are still pending.  We discussed she has a infection in her foot, we normally would admit her if she is agreeable, and she is.  When I reviewed care everywhere she has not been to [facility] in the past year.  She was last seen there in April 2018.  Nurse reports patient has some itching right around the IV site while getting her vancomycin, she is gotten about half of it in.  She was given IV Benadryl.  05:42 AM Dr Darrick Meigs, hospitalist, will see patient.   Final Clinical Impressions(s) / ED  Diagnoses   Final diagnoses:  Cellulitis of foot, right  Polysubstance abuse ([place])  Hypokalemia  Increased ammonia level   Plan admission  Rolland Porter, MD, Barbette Or, MD 01/03/18 [phone]

## 2018-01-03 NOTE — H&P (Signed)
TRH H&P    Patient Demographics:    Kristen Marsh, is a 62 y.o. female  MRN: [id number]  DOB - 1955/08/24  Admit Date - 01/03/2018  Referring MD/NP/PA: Dr. Tomi Bamberger  Outpatient Primary MD for the patient is Patient, No Pcp Per  Patient coming from: Home  Chief complaint-foot pain   HPI:    Kristen Marsh  is a 61 y.o. female, with history of liver cirrhosis, hepatitis C, hypertension, depression, alcohol abuse, polysubstance abuse came to hospital after patient developed redness and pain of right foot.  Patient says that she may have stepped on broken glass 3 days ago but not sure.  She started noticing swelling and pain with redness in the right foot for about 2 days.    Denies fever or chills. Denies nausea vomiting or diarrhea. Denies chest pain or shortness of breath. Patient has liver cirrhosis and takes lactulose but ran out of lactulose a week ago. Urine drug screen was positive for amphetamines, benzodiazepine, cocaine. Denies abdominal pain, dysuria    Review of systems:      All other systems reviewed and are negative.   With Past History of the following :    Past Medical History:  Diagnosis Date  . Anxiety   . Cirrhosis of liver ([place])   . Depression   . Emphysema   . Fibromyalgia   . GERD (gastroesophageal reflux disease)   . Hep C w/o coma, chronic ([place])   . Hypertension   . IBS (irritable bowel syndrome)   . Kidney stones   . MRSA carrier   . Osteoarthritis   . Pneumonia    As a child  . 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 Shortness of breath    with exertion  . Varicose vein    Of esophagus and top of stomach per pt      Past Surgical History:  Procedure Laterality Date  . ABDOMINAL HYSTERECTOMY    . APPENDECTOMY    . COLONOSCOPY W/ BIOPSIES AND POLYPECTOMY    . DIAGNOSTIC LAPAROSCOPY     exploratory lap  . DIRECT LARYNGOSCOPY N/A 03/22/2014   Procedure: DIRECT  LARYNGOSCOPY;  Surgeon: Ruby Cola, MD;  Location: [place];  Service: ENT;  Laterality: N/A;  With biopsy  . DIRECT LARYNGOSCOPY N/A 01/15/2015   Procedure: DIRECT LARYNGOSCOPY WITH BIOPSY;  Surgeon: Leta Baptist, MD;  Location: [place];  Service: ENT;  Laterality: N/A;  . ESOPHAGOGASTRODUODENOSCOPY (EGD) WITH PROPOFOL N/A 07/26/2014   Procedure: ESOPHAGOGASTRODUODENOSCOPY (EGD) WITH PROPOFOL;  Surgeon: Rogene Houston, MD;  Location[facility];  Service: Endoscopy;  Laterality: N/A;  . HYSTEROSCOPY        Social History:      Social History   Tobacco Use  . Smoking status: Current Every Day Smoker    Packs/day: 0.50    Years: 40.00    Pack years: 20.00    Types: Cigarettes  . Smokeless tobacco: Never Used  . Tobacco comment: 1/2 pack a day at least 40 yrs  Substance Use  Topics  . Alcohol use: Yes    Alcohol/week: 2.4 oz    Types: 4 Cans of beer per week    Comment: 1 smirnoff a day.        Family History :     Family History  Problem Relation Age of Onset  . Cancer Mother        esophageal  . Cancer Brother        Liver cancer and cirrhosis age 58      Home Medications:   Prior to Admission medications   Medication Sig Start Date End Date Taking? Authorizing Provider  albuterol (PROVENTIL) (2.5 MG/3ML) 0.083% nebulizer solution Take 3 mLs (2.5 mg total) by nebulization every 6 (six) hours as needed for wheezing or shortness of breath. 07/09/15   Sharion Balloon, FNP  budesonide-formoterol (SYMBICORT) 80-4.5 MCG/ACT inhaler Inhale 2 puffs into the lungs 2 (two) times daily. Patient not taking: Reported on 06/24/2016 06/17/16   Evelina Dun A, FNP  cetirizine (ZYRTEC) 10 MG tablet TAKE ONE TABLET BY MOUTH ONCE DAILY. 06/30/16   Sharion Balloon, FNP  cyclobenzaprine (FLEXERIL) 5 MG tablet TAKE ONE TABLET BY MOUTH 3 TIMES DAILY AS NEEDED FOR MUSCLE SPASM(S). 06/05/16   Sharion Balloon, FNP  dicyclomine (BENTYL) 20 MG tablet Take 1 tablet (20 mg total) by  mouth every 6 (six) hours. 06/05/16   Sharion Balloon, FNP  doxycycline (VIBRAMYCIN) 100 MG capsule Take 1 capsule (100 mg total) by mouth 2 (two) times daily. One po bid x 7 days 06/24/16   Daleen Bo, MD  ENULOSE 10 GM/15ML SOLN TAKE 15MLs BY MOUTH TWICE DAILY. 01/07/16   Evelina Dun A, FNP  gabapentin (NEURONTIN) 300 MG capsule TAKE 1 CAPSULE BY MOUTH THREE TIMES A DAY. Patient not taking: Reported on 06/24/2016 06/16/16   Claretta Fraise, MD  gabapentin (NEURONTIN) 400 MG capsule TAKE ONE CAPSULE BY MOUTH 3 TIMES A DAY. 09/08/16   Hawks, Christy A, FNP  lactulose (CHRONULAC) 10 GM/15ML solution TAKE 15MLS BY MOUTH TWICE DAILY. 08/26/16   Sharion Balloon, FNP  naproxen (NAPROSYN) 500 MG tablet Take 1 tablet (500 mg total) by mouth 2 (two) times daily with a meal. Patient taking differently: Take 500 mg by mouth daily as needed for mild pain or moderate pain.  06/05/16   Sharion Balloon, FNP  omeprazole (PRILOSEC) 20 MG capsule Take 1 capsule (20 mg total) by mouth 2 (two) times daily. 06/05/16   Evelina Dun A, FNP  Potassium Gluconate ([employer]S POTASSIUM GLUCONATE) 2 MEQ TABS Take 595 mg by mouth 3 (three) times a week.     [provider]  PROVENTIL HFA 108 (90 Base) MCG/ACT inhaler INHALE 2 PUFFS INTO THE LUNGS EVERY FOUR HOURS AS NEEDED FOR WHEEZING. 06/16/16   Claretta Fraise, MD     Allergies:     Allergies  Allergen Reactions  . Codeine Itching  . Hydrocodone-Acetaminophen Itching  . Meloxicam Swelling    Face swelling  . Montelukast Cough     Physical Exam:   Vitals  Blood pressure (!) 101/46, pulse 75, temperature 98.9 F (37.2 C), temperature source Oral, resp. rate 20, SpO2 95 %.  1.  General: Appears in no acute distress  2. Psychiatric:  Intact judgement and  insight, awake alert, oriented x 3.  3. Neurologic: No focal neurological deficits, all cranial nerves intact.Strength 5/5 all 4 extremities, sensation intact all 4 extremities, plantars down  going.  4. Eyes :  anicteric sclerae, moist  conjunctivae with no lid lag. PERRLA.  5. ENMT:  Oropharynx clear with moist mucous membranes and good dentition  6. Neck:  supple, no cervical lymphadenopathy appriciated, No thyromegaly  7. Respiratory : Normal respiratory effort, good air movement bilaterally,clear to  auscultation bilaterally  8. Cardiovascular : RRR, no gallops, rubs or murmurs, no leg edema  9. Gastrointestinal:  Positive bowel sounds, abdomen soft, non-tender to palpation,no hepatosplenomegaly, no rigidity or guarding       10. Skin:        11.Musculoskeletal:  Good muscle tone,  joints appear normal , no effusions,  normal range of motion    Data Review:    CBC Recent Labs  Lab 01/03/18 0315  WBC 12.0*  HGB 12.3  HCT 37.0  PLT 103*  MCV 86.4  MCH 28.7  MCHC 33.2  RDW 15.5   ------------------------------------------------------------------------------------------------------------------  Chemistries  Recent Labs  Lab 01/03/18 0315  NA 138  K 3.0*  CL 104  CO2 28  GLUCOSE 128*  BUN 11  CREATININE 0.57  CALCIUM 8.6*  AST 81*  ALT 51  ALKPHOS 69  BILITOT 2.1*   ------------------------------------------------------------------------------------------------------------------  ------------------------------------------------------------------------------------------------------------------ GFR: CrCl cannot be calculated (Unknown ideal weight.). Liver Function Tests: Recent Labs  Lab 01/03/18 0315  AST 81*  ALT 51  ALKPHOS 69  BILITOT 2.1*  PROT 6.9  ALBUMIN 3.5   No results for input(s): LIPASE, AMYLASE in the last 168 hours. Recent Labs  Lab 01/03/18 0315  AMMONIA 100*    ------------------------------------------------------------------------------------------------------------   Imaging Results:    Dg Foot Complete Right  Result Date: 01/03/2018 CLINICAL DATA:  Stepped on unknown object, with right great toe  erythema and pain. Initial encounter. EXAM: RIGHT FOOT COMPLETE - 3+ VIEW COMPARISON:  None. FINDINGS: There is no evidence of fracture or dislocation. Subcortical cystic change at the distal first metatarsal is of uncertain significance. Would correlate clinically to exclude gout. The joint spaces are preserved. There is no evidence of talar subluxation; the subtalar joint is unremarkable in appearance. A plantar calcaneal spur is noted. Soft tissue swelling is noted about the great toe. No radiopaque foreign bodies are seen. IMPRESSION: 1. No evidence of fracture or dislocation. 2. Subcortical cystic change at the distal first metatarsal is of uncertain significance. Would correlate clinically to exclude gout. 3. Soft tissue swelling about the great toe. No radiopaque foreign bodies seen. Electronically Signed   By: Garald Balding M.D.   On: 01/03/2018 03:52       Assessment & Plan:    Active Problems:   Cirrhosis of liver ([facility])   Alcoholism /alcohol abuse ([place])   Polysubstance abuse ([place])   Cocaine dependence with cocaine-induced mood disorder ([facility])   Cellulitis   1. Cellulitis of right foot-patient was started on vancomycin in the ED, will continue vancomycin per pharmacy consultation. 2. ? Gout- patient's x-ray of  Foot showed,cortical cystic change at the distal first metatarsal of uncertain significant.  Recommended to exclude gout.  Patient received Decadron in the ED.  Uric acid is 4.3, consider starting prednisone if patient does not improve with vancomycin 3. Hypokalemia-patient was given 40 mEq of K. Dur in the ED.  Will repeat one more dose of K. Dur 40 mEq p.o.  Follow BMP in am. 4. Cirrhosis of liver-ammonia level is 100, patient ran out of lactulose a week ago.  Continue with lactulose 20 g twice daily 5. Polysubstance abuse-patient says that she had stopped taking drugs but started taking cocaine last week  6. Alcohol abuse-we will initiate CIWA protocol    DVT Prophylaxis-     SCDs   AM Labs Ordered, also please review Full Orders  Family Communication: Admission, patients condition and plan of care including tests being ordered have been discussed with the patient  who indicate understanding and agree with the plan and Code Status.  Code Status: Full code  Admission status: Inpatient  Time spent in minutes : 60 minutes   Oswald Hillock M.D on 01/03/2018 at 6:12 AM  Between 7am to 7pm - Pager - [phone]. After 7pm go to [email or web address] - password [facility]  [facility] - Office  [phone]

## 2018-01-03 NOTE — Progress Notes (Signed)
Pharmacy Antibiotic Note  Kristen Marsh is a 62 y.o. female admitted on 01/03/2018 with cellulitis.  Pharmacy has been consulted for Vancomycin dosing.  Plan: Vancomycin 1000 mg IV x 1 dose Vancomycin 750 mg IV every 12 hours.  Goal trough 10-15 mcg/mL.  Monitor labs, c/s, and vanco trough as indicated  Height: 5\' 2"  (157.5 cm) Weight: 136 lb 11 oz (62 kg) IBW/kg (Calculated) : 50.1  Temp (24hrs), Avg:98.7 F (37.1 C), Min:98.4 F (36.9 C), Max:98.9 F (37.2 C)  Recent Labs  Lab 01/03/18 0315  WBC 12.0*  CREATININE 0.57    Estimated Creatinine Clearance: 64.8 mL/min (by C-G formula based on SCr of 0.57 mg/dL).    Allergies  Allergen Reactions  . Codeine Itching  . Hydrocodone-Acetaminophen Itching  . Meloxicam Swelling    Face swelling  . Montelukast Cough    Antimicrobials this admission: Vanco 6/2 >>    Dose adjustments this admission: N/A  Microbiology results:  6/2 MRSA PCR: pending  Thank you for allowing pharmacy to be a part of this patient's care.  Ramond Craver 01/03/2018 8:36 AM

## 2018-01-03 NOTE — ED Notes (Addendum)
Dr Darrick Meigs aware of reaction to vancomycin, no additional orders received,

## 2018-01-03 NOTE — Progress Notes (Signed)
Patient seen and examined.  Admitted midnight secondary to right foot swelling, pain, redness and warmth sensation in the setting of acute cellulitis.  Patient is afebrile, no nausea, no vomiting, stable vital signs.  There was some concerns based on images studies for underlying gout.  Patient also with prior history of psoriasis and arthritis.  1 dose of Decadron was given.  Patient started on vancomycin.  Please refer to H&P written by Dr. Darrick Meigs for further info/details on admission.  Plan: -Keep leg elevated -Continue current IV antibiotics -Will use ibuprofen for anti-inflammatory effects and also pain control -Avoid the use of narcotics given history of polysubstance abuse. -follow clinical response  Barton Dubois MD [phone]

## 2018-01-03 NOTE — ED Triage Notes (Addendum)
Pt stated about three days ago she stepped on something (unknown)  Rt foot and tried to treat it at home. Today it's read and painful.

## 2018-01-03 NOTE — ED Notes (Addendum)
Pt c/o itching at iv site after vancomycin started, Dr. Tomi Bamberger notified, additional orders given, rate of infusion decreased

## 2018-01-03 NOTE — ED Notes (Signed)
ED Provider at bedside. 

## 2018-01-04 DIAGNOSIS — E876 Hypokalemia: Secondary | ICD-10-CM

## 2018-01-04 DIAGNOSIS — K7469 Other cirrhosis of liver: Secondary | ICD-10-CM

## 2018-01-04 DIAGNOSIS — F1020 Alcohol dependence, uncomplicated: Secondary | ICD-10-CM

## 2018-01-04 DIAGNOSIS — F1424 Cocaine dependence with cocaine-induced mood disorder: Secondary | ICD-10-CM

## 2018-01-04 LAB — COMPREHENSIVE METABOLIC PANEL
ALT: 40 U/L (ref 14–54)
ANION GAP: 4 — AB (ref 5–15)
AST: 45 U/L — ABNORMAL HIGH (ref 15–41)
Albumin: 2.9 g/dL — ABNORMAL LOW (ref 3.5–5.0)
Alkaline Phosphatase: 66 U/L (ref 38–126)
BUN: 11 mg/dL (ref 6–20)
CHLORIDE: 106 mmol/L (ref 101–111)
CO2: 26 mmol/L (ref 22–32)
CREATININE: 0.54 mg/dL (ref 0.44–1.00)
Calcium: 8.5 mg/dL — ABNORMAL LOW (ref 8.9–10.3)
Glucose, Bld: 167 mg/dL — ABNORMAL HIGH (ref 65–99)
POTASSIUM: 4.3 mmol/L (ref 3.5–5.1)
SODIUM: 136 mmol/L (ref 135–145)
Total Bilirubin: 0.8 mg/dL (ref 0.3–1.2)
Total Protein: 6 g/dL — ABNORMAL LOW (ref 6.5–8.1)

## 2018-01-04 LAB — MAGNESIUM: MAGNESIUM: 1.8 mg/dL (ref 1.7–2.4)

## 2018-01-04 LAB — HIV ANTIBODY (ROUTINE TESTING W REFLEX): HIV Screen 4th Generation wRfx: NONREACTIVE

## 2018-01-04 LAB — CBC
HCT: 35.1 % — ABNORMAL LOW (ref 36.0–46.0)
Hemoglobin: 11.6 g/dL — ABNORMAL LOW (ref 12.0–15.0)
MCH: 28.6 pg (ref 26.0–34.0)
MCHC: 33 g/dL (ref 30.0–36.0)
MCV: 86.7 fL (ref 78.0–100.0)
PLATELETS: 80 10*3/uL — AB (ref 150–400)
RBC: 4.05 MIL/uL (ref 3.87–5.11)
RDW: 15.4 % (ref 11.5–15.5)
WBC: 7.5 10*3/uL (ref 4.0–10.5)

## 2018-01-04 MED ORDER — DOXYCYCLINE HYCLATE 100 MG PO TABS
100.0000 mg | ORAL_TABLET | Freq: Two times a day (BID) | ORAL | Status: DC
  Administered 2018-01-04 – 2018-01-05 (×2): 100 mg via ORAL
  Filled 2018-01-04 (×2): qty 1

## 2018-01-04 MED ORDER — PREGABALIN 50 MG PO CAPS
50.0000 mg | ORAL_CAPSULE | Freq: Three times a day (TID) | ORAL | Status: DC
  Administered 2018-01-04 – 2018-01-05 (×3): 50 mg via ORAL
  Filled 2018-01-04 (×3): qty 1

## 2018-01-04 MED ORDER — ALUM & MAG HYDROXIDE-SIMETH 200-200-20 MG/5ML PO SUSP
30.0000 mL | Freq: Four times a day (QID) | ORAL | Status: DC | PRN
  Administered 2018-01-04: 30 mL via ORAL
  Filled 2018-01-04: qty 30

## 2018-01-04 NOTE — Progress Notes (Addendum)
PROGRESS NOTE   Kristen Marsh  JKK:[id number] DOB: [birth year] DOA: 01/03/2018 PCP: Patient, No Pcp Per   Brief Narrative:  62 y.o. female, with history of liver cirrhosis, hepatitis C, hypertension, depression, alcohol abuse, polysubstance abuse came to hospital after patient developed redness and pain of right foot.  Patient says that she may have stepped on broken glass 3 days ago but not sure.  She started noticing swelling and pain with redness in the right foot for about 2 days prior to admission.   Assessment & Plan: 1-right foot cellulitis: -No abscess or osteomyelitis appreciated on images studies -Patient had received 40 hours of vancomycin and has noticed significant improvement in the swelling and redness of her right foot -Will transition antibiotics by mouth and assess clinical response -Patient to start walking on it and bearing weight in anticipation for discharge in a.m. -Continue ibuprofen for anti-inflammatory effect and analgesic -Patient is afebrile and her WBCs are back to normal.  2-history of alcohol abuse -Cessation counseling has been provided -Patient reported she is no drinking that much anymore -Continue to monitor on CIWA. -Thiamine and folic acid -No signs of acute withdrawal currently.  3-prior history of cirrhosis -No ascites -Continue lactulose  4-history of COPD/asthma -No wheezing, good oxygen saturation on room air -Continue Dulera and PRN albuterol  5-hypokalemia -repleted and WNL now  6-neuropathy -will start lyrica and follow response   7-GERD -continue PPI  8-hx of cocaine -cessation counseling provided -will avoid use of narcotics on her    DVT prophylaxis: SCD's and ambulation.  Code Status: Full code Family Communication: No family at bedside Disposition Plan: will transition antibiotics to p.o. and assess clinical response, continue NSAIDs for anti-inflammatory effects and pain control.  We will also start Lyrica as she is  complaining of neuropathic pain affecting both of her feet.  Hopefully discharge in a.m.  Consultants:   None  Procedures:   See below for x-ray reports.  Antimicrobials:  Anti-infectives (From admission, onward)   Start     Dose/Rate Route Frequency Ordered Stop   01/04/18 2200  doxycycline (VIBRA-TABS) tablet 100 mg     100 mg Oral Every 12 hours 01/04/18 1658     01/03/18 1700  vancomycin (VANCOCIN) IVPB 750 mg/150 ml premix  Status:  Discontinued     750 mg 150 mL/hr over 60 Minutes Intravenous Every 12 hours 01/03/18 0835 01/04/18 1658   01/03/18 0430  vancomycin (VANCOCIN) IVPB 1000 mg/200 mL premix     1,000 mg 200 mL/hr over 60 Minutes Intravenous  Once 01/03/18 0418 01/03/18 0623   01/03/18 0430  piperacillin-tazobactam (ZOSYN) IVPB 3.375 g     3.375 g 100 mL/hr over 30 Minutes Intravenous  Once 01/03/18 0418 01/03/18 0507      Subjective: Afebrile, denies chest pain, no nausea, no vomiting.  Still having some pain very little swelling on her right foot.  Reports some improvement but it weight.  Objective: Vitals:   01/03/18 2242 01/04/18 0643 01/04/18 0843 01/04/18 1506  BP: (!) 123/56 (!) 110/43  113/81  Pulse: 66 65  72  Resp: 18 18  19   Temp: 98.2 F (36.8 C) 97.9 F (36.6 C)  98.2 F (36.8 C)  TempSrc: Oral Oral  Oral  SpO2: 98% 97% 96% 99%  Weight:      Height:       No intake or output data in the 24 hours ending 01/04/18 1659 Filed Weights   01/03/18 0721 01/03/18 0754  Weight: 67.6  kg (149 lb) 62 kg (136 lb 11 oz)    Examination: General exam: Alert, awake, oriented x 3; afebrile, denies chest pain, no shortness of breath, no nausea, no vomiting.  Patient reports improvement in her right foot swelling, erythema and pain; but still having difficulty bearing weight on it. Respiratory system: Clear to auscultation. Respiratory effort normal. Cardiovascular system:RRR. No murmurs, rubs, gallops. Gastrointestinal system: Abdomen is nondistended,  soft and nontender. No organomegaly or masses felt. Normal bowel sounds heard. Central nervous system: Alert and oriented. No focal neurological deficits. Extremities: trace edema in her right foot, no cyanosis, no clubbing. Skin: Mild psoriatic lesions appreciated on her right lower extremity and both elbows area; she also have mild erythema on her right foot volar aspect. Puncture wound, that is healing appreciated in her right sole; no drainage.  Psychiatry: Judgement and insight appear normal. Mood & affect appropriate.    Data Reviewed: I have personally reviewed following labs and imaging studies  CBC: Recent Labs  Lab 01/03/18 0315 01/04/18 0559  WBC 12.0* 7.5  HGB 12.3 11.6*  HCT 37.0 35.1*  MCV 86.4 86.7  PLT 103* 80*   Basic Metabolic Panel: Recent Labs  Lab 01/03/18 0315 01/04/18 0559  NA 138 136  K 3.0* 4.3  CL 104 106  CO2 28 26  GLUCOSE 128* 167*  BUN 11 11  CREATININE 0.57 0.54  CALCIUM 8.6* 8.5*  MG  --  1.8   GFR: Estimated Creatinine Clearance: 64.8 mL/min (by C-G formula based on SCr of 0.54 mg/dL).   Liver Function Tests: Recent Labs  Lab 01/03/18 0315 01/04/18 0559  AST 81* 45*  ALT 51 40  ALKPHOS 69 66  BILITOT 2.1* 0.8  PROT 6.9 6.0*  ALBUMIN 3.5 2.9*    Recent Labs  Lab 01/03/18 0315  AMMONIA 100*    Recent Results (from the past 240 hour(s))  MRSA PCR Screening     Status: None   Collection Time: 01/03/18  8:30 AM  Result Value Ref Range Status   MRSA by PCR NEGATIVE NEGATIVE Final    Comment:        The GeneXpert MRSA Assay (FDA approved for NASAL specimens only), is one component of a comprehensive MRSA colonization surveillance program. It is not intended to diagnose MRSA infection nor to guide or monitor treatment for MRSA infections. Performed at [facility], [address] [place], [place] [address]      Radiology Studies: Dg Foot Complete Right  Result Date: 01/03/2018 CLINICAL DATA:  Stepped on unknown  object, with right great toe erythema and pain. Initial encounter. EXAM: RIGHT FOOT COMPLETE - 3+ VIEW COMPARISON:  None. FINDINGS: There is no evidence of fracture or dislocation. Subcortical cystic change at the distal first metatarsal is of uncertain significance. Would correlate clinically to exclude gout. The joint spaces are preserved. There is no evidence of talar subluxation; the subtalar joint is unremarkable in appearance. A plantar calcaneal spur is noted. Soft tissue swelling is noted about the great toe. No radiopaque foreign bodies are seen. IMPRESSION: 1. No evidence of fracture or dislocation. 2. Subcortical cystic change at the distal first metatarsal is of uncertain significance. Would correlate clinically to exclude gout. 3. Soft tissue swelling about the great toe. No radiopaque foreign bodies seen. Electronically Signed   By: Garald Balding M.D.   On: 01/03/2018 03:52    Scheduled Meds: . doxycycline  100 mg Oral Q12H  . folic acid  1 mg Oral Daily  .  ibuprofen  600 mg Oral TID  . lactulose  20 g Oral BID  . loratadine  10 mg Oral Daily  . mometasone-formoterol  2 puff Inhalation BID  . multivitamin with minerals  1 tablet Oral Daily  . pantoprazole  40 mg Oral Daily  . pregabalin  50 mg Oral TID  . sodium chloride flush  3 mL Intravenous Q12H  . thiamine  100 mg Oral Daily   Or  . thiamine  100 mg Intravenous Daily   Continuous Infusions: . sodium chloride       LOS: 1 day    Time spent: 30 minutes     Barton Dubois, MD [facility] Pager [phone]  If 7PM-7AM, please contact night-coverage [email or web address] Password [email or web address] 01/04/2018, 4:59 PM

## 2018-01-04 NOTE — Care Management (Signed)
Pt provided list of PCP's accepting new pt's.

## 2018-01-05 DIAGNOSIS — E876 Hypokalemia: Secondary | ICD-10-CM

## 2018-01-05 DIAGNOSIS — F1910 Other psychoactive substance abuse, uncomplicated: Secondary | ICD-10-CM

## 2018-01-05 DIAGNOSIS — L03115 Cellulitis of right lower limb: Secondary | ICD-10-CM

## 2018-01-05 LAB — BASIC METABOLIC PANEL
Anion gap: 5 (ref 5–15)
BUN: 8 mg/dL (ref 6–20)
CHLORIDE: 105 mmol/L (ref 101–111)
CO2: 29 mmol/L (ref 22–32)
Calcium: 8.1 mg/dL — ABNORMAL LOW (ref 8.9–10.3)
Creatinine, Ser: 0.53 mg/dL (ref 0.44–1.00)
GFR calc Af Amer: >60 mL/min (ref >60)
GFR calc non Af Amer: >60 mL/min (ref >60)
Glucose, Bld: 95 mg/dL (ref 65–99)
POTASSIUM: 4 mmol/L (ref 3.5–5.1)
SODIUM: 139 mmol/L (ref 135–145)

## 2018-01-05 LAB — CBC
HCT: 36.5 % (ref 36.0–46.0)
HEMOGLOBIN: 11.7 g/dL — AB (ref 12.0–15.0)
MCH: 28.1 pg (ref 26.0–34.0)
MCHC: 32.1 g/dL (ref 30.0–36.0)
MCV: 87.7 fL (ref 78.0–100.0)
Platelets: 78 10*3/uL — ABNORMAL LOW (ref 150–400)
RBC: 4.16 MIL/uL (ref 3.87–5.11)
RDW: 15.9 % — ABNORMAL HIGH (ref 11.5–15.5)
WBC: 4 10*3/uL (ref 4.0–10.5)

## 2018-01-05 MED ORDER — PREGABALIN 50 MG PO CAPS: 50.0000 mg | ORAL_CAPSULE | Freq: Three times a day (TID) | ORAL | 1 refills | Status: DC

## 2018-01-05 MED ORDER — IBUPROFEN 600 MG PO TABS: 600.0000 mg | ORAL_TABLET | Freq: Three times a day (TID) | ORAL | 0 refills | Status: AC | PRN

## 2018-01-05 MED ORDER — LACTULOSE 10 GM/15ML PO SOLN: 20.0000 g | Freq: Two times a day (BID) | ORAL | 1 refills | Status: DC

## 2018-01-05 MED ORDER — DOXYCYCLINE HYCLATE 100 MG PO TABS: 100.0000 mg | ORAL_TABLET | Freq: Two times a day (BID) | ORAL | 0 refills | Status: DC

## 2018-01-05 MED ORDER — CYCLOBENZAPRINE HCL 5 MG PO TABS: ORAL_TABLET | ORAL | 0 refills | Status: AC

## 2018-01-05 NOTE — Progress Notes (Signed)
Patient's IV removed, 2x2 gauze and paper tape applied to site, patient tolerated well. Reviewed AVS/ discharge information with patient who verbalized understanding.  Patient to be transported home by brother.

## 2018-01-05 NOTE — Discharge Summary (Signed)
Physician Discharge Summary  Kristen Marsh OEV:[id number] DOB: 09/29/1955 DOA: 01/03/2018  PCP: Patient, No Pcp Per  Admit date: 01/03/2018 Discharge date: 01/05/2018  Time spent: 35 minutes  Recommendations for Outpatient Follow-up:  1. Repeat CBC to follow WBCs, hemoglobin and platelets trend 2. -Repeat basic metabolic panel to follow electrolytes and renal function   Discharge Diagnoses:  Cirrhosis of liver (Kristen Marsh) Alcoholism /alcohol abuse (Kristen Marsh) Polysubstance abuse (Kristen Marsh) COPD/asthma. Cellulitis of foot, right Hypokalemia   Discharge Condition: Stable and improved.  Discharge home with instruction to follow-up with PCP in 10 days.  Diet recommendation: Patient instructed to follow low-sodium diet.  Filed Weights   01/03/18 0721 01/03/18 0754  Weight: 67.6 kg (149 lb) 62 kg (136 lb 11 oz)    Brief History of present illness:  62 y.o.female,with history of liver cirrhosis, hepatitis C, hypertension, depression, alcohol abuse, polysubstance abuse came to hospital after patient developed redness and pain of right foot. Patient says that she may have stepped on broken glass 3 days ago but not sure. She started noticing swelling and pain with redness in the right foot for about 2 days prior to admission.  Hospital Course:  1-right foot cellulitis: -No abscess or osteomyelitis appreciated on images studies -Patient received 40 hours of vancomycin and noticed significant improvement in the swelling and redness of her right foot -antibiotics transitioned to PO using doxycycline and patient observed for 24 hours to assess tolerance and clinical response -at discharge no significant swelling appreciated and no redness.  -patient afebrile and with normal WBC's -advise to follow up with PCP in 10 days  2-history of alcohol abuse -Cessation counseling has been provided -Patient reported she is no drinking that much anymore -monitor under CIWA protocol while inpatient; no withdrawal  symptoms appreciated.  -advise to start daily MV to supplement Thiamine and folic acid  3-prior history of cirrhosis -No ascites appreciated -Continue lactulose -advise to follow low sodium diet   4-history of COPD/asthma -No wheezing, good oxygen saturation on room air -Continue symbicort and PRN albuterol  5-hypokalemia -repleted and WNL at discharge  6-neuropathy -will discharge on lyrica  -follow response and adjust medication as needed.  7-GERD -continue PPI  8-hx of cocaine -cessation counseling provided -no narcotics given during this hospitalization    Procedures:  See below for x-ray reports.  Consultations:  None  Discharge Exam: Vitals:   01/05/18 0602 01/05/18 0754  BP: (!) 127/57   Pulse: 69   Resp:    Temp: 98.6 F (37 C)   SpO2: 97% 96%    General exam: Alert, awake, oriented x 3; afebrile, denies chest pain, no shortness of breath, no nausea, no vomiting.  Patient reports improvement in her right foot swelling and pain; erythematous changes has completely resolved by now.   Respiratory system: Clear to auscultation. Respiratory effort normal. Cardiovascular system:RRR. No murmurs, rubs, gallops. Gastrointestinal system: Abdomen is nondistended, soft and nontender. No organomegaly or masses felt. Normal bowel sounds heard. Central nervous system: Alert and oriented. No focal neurological deficits. Extremities: trace edema in her right foot, no cyanosis, no clubbing. Skin: Mild psoriatic lesions appreciated on her right lower extremity and both elbows area; at discharge patient right foot erythema has completely resolved; plantar puncture wound looks significantly better and is just having mild to moderate discomfort while bearing weight.  Psychiatry: Judgement and insight appear normal. Mood & affect appropriate.    Discharge Instructions   Discharge Instructions    Diet - low sodium heart healthy  Complete by:  As directed     Discharge instructions   Complete by:  As directed    Take medications as prescribed Arrange follow-up with PCP in 10 days Keep yourself well-hydrated Follow low-sodium diet Take a multivitamin on daily basis to supplement folic acid and thiamine.     Allergies as of 01/05/2018      Reactions   Codeine Itching   Hydrocodone-acetaminophen Itching   Meloxicam Swelling   Face swelling   Montelukast Cough      Medication List    STOP taking these medications   dicyclomine 20 MG tablet Commonly known as:  BENTYL   doxycycline 100 MG capsule Commonly known as:  VIBRAMYCIN Replaced by:  doxycycline 100 MG tablet   ENULOSE 10 GM/15ML Soln Generic drug:  lactulose (encephalopathy) Replaced by:  lactulose 10 GM/15ML solution   gabapentin 400 MG capsule Commonly known as:  NEURONTIN   naproxen 500 MG tablet Commonly known as:  NAPROSYN     TAKE these medications   albuterol (2.5 MG/3ML) 0.083% nebulizer solution Commonly known as:  PROVENTIL Take 3 mLs (2.5 mg total) by nebulization every 6 (six) hours as needed for wheezing or shortness of breath.   PROVENTIL HFA 108 (90 Base) MCG/ACT inhaler Generic drug:  albuterol INHALE 2 PUFFS INTO THE LUNGS EVERY FOUR HOURS AS NEEDED FOR WHEEZING.   budesonide-formoterol 80-4.5 MCG/ACT inhaler Commonly known as:  SYMBICORT Inhale 2 puffs into the lungs 2 (two) times daily.   cetirizine 10 MG tablet Commonly known as:  ZYRTEC TAKE ONE TABLET BY MOUTH ONCE DAILY.   [employer] POTASSIUM GLUCONATE 2 MEQ Tabs Generic drug:  Potassium Gluconate Take 595 mg by mouth 3 (three) times a week.   cyclobenzaprine 5 MG tablet Commonly known as:  FLEXERIL TAKE ONE TABLET BY MOUTH 3 TIMES DAILY AS NEEDED FOR MUSCLE SPASM(S).   doxycycline 100 MG tablet Commonly known as:  VIBRA-TABS Take 1 tablet (100 mg total) by mouth every 12 (twelve) hours. Replaces:  doxycycline 100 MG capsule   ibuprofen 600 MG tablet Commonly known as:   ADVIL,MOTRIN Take 1 tablet (600 mg total) by mouth every 8 (eight) hours as needed for moderate pain.   lactulose 10 GM/15ML solution Commonly known as:  CHRONULAC Take 30 mLs (20 g total) by mouth 2 (two) times daily. Replaces:  ENULOSE 10 GM/15ML Soln   omeprazole 20 MG capsule Commonly known as:  PRILOSEC Take 1 capsule (20 mg total) by mouth 2 (two) times daily.   pregabalin 50 MG capsule Commonly known as:  LYRICA Take 1 capsule (50 mg total) by mouth 3 (three) times daily.      Allergies  Allergen Reactions  . Codeine Itching  . Hydrocodone-Acetaminophen Itching  . Meloxicam Swelling    Face swelling  . Montelukast Cough    The results of significant diagnostics from this hospitalization (including imaging, microbiology, ancillary and laboratory) are listed below for reference.    Significant Diagnostic Studies: Dg Foot Complete Right  Result Date: 01/03/2018 CLINICAL DATA:  Stepped on unknown object, with right great toe erythema and pain. Initial encounter. EXAM: RIGHT FOOT COMPLETE - 3+ VIEW COMPARISON:  None. FINDINGS: There is no evidence of fracture or dislocation. Subcortical cystic change at the distal first metatarsal is of uncertain significance. Would correlate clinically to exclude gout. The joint spaces are preserved. There is no evidence of talar subluxation; the subtalar joint is unremarkable in appearance. A plantar calcaneal spur is noted. Soft tissue swelling is  noted about the great toe. No radiopaque foreign bodies are seen. IMPRESSION: 1. No evidence of fracture or dislocation. 2. Subcortical cystic change at the distal first metatarsal is of uncertain significance. Would correlate clinically to exclude gout. 3. Soft tissue swelling about the great toe. No radiopaque foreign bodies seen. Electronically Signed   By: Garald Balding M.D.   On: 01/03/2018 03:52    Microbiology: Recent Results (from the past 240 hour(s))  MRSA PCR Screening     Status: None    Collection Time: 01/03/18  8:30 AM  Result Value Ref Range Status   MRSA by PCR NEGATIVE NEGATIVE Final    Comment:        The GeneXpert MRSA Assay (FDA approved for NASAL specimens only), is one component of a comprehensive MRSA colonization surveillance program. It is not intended to diagnose MRSA infection nor to guide or monitor treatment for MRSA infections. Performed at [facility], [address] [place], [place] [address]      Labs: Basic Metabolic Panel: Recent Labs  Lab 01/03/18 0315 01/04/18 0559 01/05/18 0515  NA 138 136 139  K 3.0* 4.3 4.0  CL 104 106 105  CO2 28 26 29   GLUCOSE 128* 167* 95  BUN 11 11 8   CREATININE 0.57 0.54 0.53  CALCIUM 8.6* 8.5* 8.1*  MG  --  1.8  --    Liver Function Tests: Recent Labs  Lab 01/03/18 0315 01/04/18 0559  AST 81* 45*  ALT 51 40  ALKPHOS 69 66  BILITOT 2.1* 0.8  PROT 6.9 6.0*  ALBUMIN 3.5 2.9*    Recent Labs  Lab 01/03/18 0315  AMMONIA 100*   CBC: Recent Labs  Lab 01/03/18 0315 01/04/18 0559 01/05/18 0515  WBC 12.0* 7.5 4.0  HGB 12.3 11.6* 11.7*  HCT 37.0 35.1* 36.5  MCV 86.4 86.7 87.7  PLT 103* 80* 78*    Signed:  Barton Dubois MD.  [facility] 01/05/2018, 11:51 AM

## 2018-01-06 ENCOUNTER — Inpatient Hospital Stay (HOSPITAL_COMMUNITY)
Admission: EM | Admit: 2018-01-06 | Discharge: 2018-01-09 | DRG: 603 | Disposition: A | Discharge status: Home Health | Payer: Medicaid Other | Attending: Internal Medicine | Admitting: Internal Medicine

## 2018-01-06 ENCOUNTER — Other Ambulatory Visit: Payer: Self-pay

## 2018-01-06 ENCOUNTER — Emergency Department (HOSPITAL_COMMUNITY): Payer: Medicaid Other

## 2018-01-06 ENCOUNTER — Encounter (HOSPITAL_COMMUNITY): Payer: Self-pay | Admitting: Emergency Medicine

## 2018-01-06 DIAGNOSIS — Z8659 Personal history of other mental and behavioral disorders: Secondary | ICD-10-CM | POA: Diagnosis not present

## 2018-01-06 DIAGNOSIS — L03115 Cellulitis of right lower limb: Secondary | ICD-10-CM | POA: Diagnosis present

## 2018-01-06 DIAGNOSIS — Z885 Allergy status to narcotic agent status: Secondary | ICD-10-CM | POA: Diagnosis not present

## 2018-01-06 DIAGNOSIS — Z79899 Other long term (current) drug therapy: Secondary | ICD-10-CM

## 2018-01-06 DIAGNOSIS — Z22322 Carrier or suspected carrier of Methicillin resistant Staphylococcus aureus: Secondary | ICD-10-CM

## 2018-01-06 DIAGNOSIS — J439 Emphysema, unspecified: Secondary | ICD-10-CM | POA: Diagnosis present

## 2018-01-06 DIAGNOSIS — Z888 Allergy status to other drugs, medicaments and biological substances status: Secondary | ICD-10-CM | POA: Diagnosis not present

## 2018-01-06 DIAGNOSIS — F17210 Nicotine dependence, cigarettes, uncomplicated: Secondary | ICD-10-CM | POA: Diagnosis present

## 2018-01-06 DIAGNOSIS — W25XXXA Contact with sharp glass, initial encounter: Secondary | ICD-10-CM | POA: Diagnosis present

## 2018-01-06 DIAGNOSIS — M797 Fibromyalgia: Secondary | ICD-10-CM | POA: Diagnosis present

## 2018-01-06 DIAGNOSIS — M1990 Unspecified osteoarthritis, unspecified site: Secondary | ICD-10-CM | POA: Diagnosis present

## 2018-01-06 DIAGNOSIS — B182 Chronic viral hepatitis C: Secondary | ICD-10-CM | POA: Diagnosis present

## 2018-01-06 DIAGNOSIS — L02611 Cutaneous abscess of right foot: Secondary | ICD-10-CM | POA: Diagnosis present

## 2018-01-06 DIAGNOSIS — Z886 Allergy status to analgesic agent status: Secondary | ICD-10-CM

## 2018-01-06 DIAGNOSIS — Z8679 Personal history of other diseases of the circulatory system: Secondary | ICD-10-CM

## 2018-01-06 DIAGNOSIS — F1010 Alcohol abuse, uncomplicated: Secondary | ICD-10-CM | POA: Diagnosis present

## 2018-01-06 DIAGNOSIS — K7460 Unspecified cirrhosis of liver: Secondary | ICD-10-CM | POA: Diagnosis present

## 2018-01-06 LAB — CBC WITH DIFFERENTIAL/PLATELET
BASOS ABS: 0 10*3/uL (ref 0.0–0.1)
Basophils Relative: 0 %
Eosinophils Absolute: 0.1 10*3/uL (ref 0.0–0.7)
Eosinophils Relative: 2 %
HEMATOCRIT: 38.2 % (ref 36.0–46.0)
Hemoglobin: 12.6 g/dL (ref 12.0–15.0)
LYMPHS ABS: 0.8 10*3/uL (ref 0.7–4.0)
LYMPHS PCT: 16 %
MCH: 29 pg (ref 26.0–34.0)
MCHC: 33 g/dL (ref 30.0–36.0)
MCV: 87.8 fL (ref 78.0–100.0)
Monocytes Absolute: 0.8 10*3/uL (ref 0.1–1.0)
Monocytes Relative: 15 %
NEUTROS ABS: 3.6 10*3/uL (ref 1.7–7.7)
Neutrophils Relative %: 67 %
Platelets: 75 10*3/uL — ABNORMAL LOW (ref 150–400)
RBC: 4.35 MIL/uL (ref 3.87–5.11)
RDW: 16.2 % — ABNORMAL HIGH (ref 11.5–15.5)
WBC: 5.4 10*3/uL (ref 4.0–10.5)

## 2018-01-06 LAB — BASIC METABOLIC PANEL
ANION GAP: 9 (ref 5–15)
BUN: 10 mg/dL (ref 6–20)
CHLORIDE: 105 mmol/L (ref 101–111)
CO2: 25 mmol/L (ref 22–32)
CREATININE: 0.49 mg/dL (ref 0.44–1.00)
Calcium: 8.6 mg/dL — ABNORMAL LOW (ref 8.9–10.3)
GFR calc non Af Amer: >60 mL/min (ref >60)
Glucose, Bld: 94 mg/dL (ref 65–99)
Potassium: 4 mmol/L (ref 3.5–5.1)
SODIUM: 139 mmol/L (ref 135–145)

## 2018-01-06 LAB — LACTIC ACID, PLASMA: Lactic Acid, Venous: 1 mmol/L (ref 0.5–1.9)

## 2018-01-06 MED ORDER — SODIUM CHLORIDE 0.9% FLUSH
3.0000 mL | Freq: Two times a day (BID) | INTRAVENOUS | Status: DC
  Administered 2018-01-06 – 2018-01-09 (×4): 3 mL via INTRAVENOUS

## 2018-01-06 MED ORDER — ONDANSETRON HCL 4 MG PO TABS: 4.0000 mg | ORAL_TABLET | Freq: Four times a day (QID) | ORAL | Status: DC | PRN

## 2018-01-06 MED ORDER — OXYCODONE HCL 5 MG PO TABS
5.0000 mg | ORAL_TABLET | ORAL | Status: DC | PRN
  Administered 2018-01-07 – 2018-01-09 (×7): 5 mg via ORAL
  Filled 2018-01-06 (×8): qty 1

## 2018-01-06 MED ORDER — ACETAMINOPHEN 500 MG PO TABS: 500.0000 mg | ORAL_TABLET | Freq: Four times a day (QID) | ORAL | Status: DC | PRN

## 2018-01-06 MED ORDER — SODIUM CHLORIDE 0.9% FLUSH: 3.0000 mL | INTRAVENOUS | Status: DC | PRN

## 2018-01-06 MED ORDER — CYCLOBENZAPRINE HCL 10 MG PO TABS: 5.0000 mg | ORAL_TABLET | Freq: Three times a day (TID) | ORAL | Status: DC | PRN

## 2018-01-06 MED ORDER — FENTANYL CITRATE (PF) 100 MCG/2ML IJ SOLN
50.0000 ug | INTRAMUSCULAR | Status: AC | PRN
  Administered 2018-01-06 (×2): 50 ug via INTRAVENOUS
  Filled 2018-01-06 (×2): qty 2

## 2018-01-06 MED ORDER — DIPHENHYDRAMINE HCL 25 MG PO CAPS: 25.0000 mg | ORAL_CAPSULE | ORAL | Status: DC | PRN

## 2018-01-06 MED ORDER — LACTULOSE 10 GM/15ML PO SOLN
20.0000 g | Freq: Two times a day (BID) | ORAL | Status: DC
  Administered 2018-01-06 – 2018-01-09 (×6): 20 g via ORAL
  Filled 2018-01-06 (×6): qty 30

## 2018-01-06 MED ORDER — DIPHENHYDRAMINE HCL 50 MG/ML IJ SOLN
25.0000 mg | Freq: Once | INTRAMUSCULAR | Status: AC
Start: 2018-01-06 — End: 2018-01-06
  Administered 2018-01-06: 25 mg via INTRAVENOUS
  Filled 2018-01-06: qty 1

## 2018-01-06 MED ORDER — ZOLPIDEM TARTRATE 5 MG PO TABS: 5.0000 mg | ORAL_TABLET | Freq: Every evening | ORAL | Status: DC | PRN

## 2018-01-06 MED ORDER — SENNOSIDES-DOCUSATE SODIUM 8.6-50 MG PO TABS: 1.0000 | ORAL_TABLET | Freq: Every evening | ORAL | Status: DC | PRN

## 2018-01-06 MED ORDER — ADULT MULTIVITAMIN W/MINERALS CH
1.0000 | ORAL_TABLET | Freq: Every day | ORAL | Status: DC
  Administered 2018-01-07 – 2018-01-09 (×3): 1 via ORAL
  Filled 2018-01-06 (×5): qty 1

## 2018-01-06 MED ORDER — ACETAMINOPHEN 650 MG RE SUPP: 650.0000 mg | Freq: Four times a day (QID) | RECTAL | Status: DC | PRN

## 2018-01-06 MED ORDER — ALBUTEROL SULFATE (2.5 MG/3ML) 0.083% IN NEBU: 2.5000 mg | INHALATION_SOLUTION | Freq: Four times a day (QID) | RESPIRATORY_TRACT | Status: DC | PRN

## 2018-01-06 MED ORDER — CLINDAMYCIN PHOSPHATE 900 MG/50ML IV SOLN
900.0000 mg | Freq: Once | INTRAVENOUS | Status: AC
  Administered 2018-01-06: 900 mg via INTRAVENOUS
  Filled 2018-01-06: qty 50

## 2018-01-06 MED ORDER — BISACODYL 5 MG PO TBEC: 5.0000 mg | DELAYED_RELEASE_TABLET | Freq: Every day | ORAL | Status: DC | PRN

## 2018-01-06 MED ORDER — HEPARIN SODIUM (PORCINE) 5000 UNIT/ML IJ SOLN
5000.0000 [IU] | Freq: Three times a day (TID) | INTRAMUSCULAR | Status: DC
  Administered 2018-01-06 – 2018-01-09 (×9): 5000 [IU] via SUBCUTANEOUS
  Filled 2018-01-06 (×9): qty 1

## 2018-01-06 MED ORDER — CIPROFLOXACIN IN D5W 400 MG/200ML IV SOLN
400.0000 mg | Freq: Two times a day (BID) | INTRAVENOUS | Status: DC
  Administered 2018-01-06 – 2018-01-09 (×6): 400 mg via INTRAVENOUS
  Filled 2018-01-06 (×6): qty 200

## 2018-01-06 MED ORDER — POTASSIUM GLUCONATE 2 MEQ PO TABS: 1.0000 | ORAL_TABLET | ORAL | Status: DC

## 2018-01-06 MED ORDER — MORPHINE SULFATE (PF) 2 MG/ML IV SOLN
2.0000 mg | INTRAVENOUS | Status: DC | PRN
Start: 2018-01-06 — End: 2018-01-07
  Administered 2018-01-06 – 2018-01-07 (×3): 2 mg via INTRAVENOUS
  Filled 2018-01-06 (×3): qty 1

## 2018-01-06 MED ORDER — IOPAMIDOL (ISOVUE-300) INJECTION 61%
75.0000 mL | Freq: Once | INTRAVENOUS | Status: AC | PRN
  Administered 2018-01-06: 75 mL via INTRAVENOUS

## 2018-01-06 MED ORDER — CLINDAMYCIN PHOSPHATE 600 MG/50ML IV SOLN
600.0000 mg | Freq: Three times a day (TID) | INTRAVENOUS | Status: DC
  Administered 2018-01-06 – 2018-01-09 (×8): 600 mg via INTRAVENOUS
  Filled 2018-01-06 (×8): qty 50

## 2018-01-06 MED ORDER — SODIUM CHLORIDE 0.9 % IV SOLN: 250.0000 mL | INTRAVENOUS | Status: DC | PRN

## 2018-01-06 MED ORDER — ONDANSETRON HCL 4 MG/2ML IJ SOLN: 4.0000 mg | Freq: Four times a day (QID) | INTRAMUSCULAR | Status: DC | PRN

## 2018-01-06 NOTE — ED Triage Notes (Signed)
Pt c/o worsening redness, edema, and warmth to RT foot. Pt d/c yesterday for infection in foot.

## 2018-01-06 NOTE — Progress Notes (Signed)
Pharmacy Antibiotic Note  Kristen Marsh is a 62 y.o. female admitted on 01/06/2018 with cellulitis and foot infection.  Pharmacy has been consulted for cipro dosing.  Plan: Cipro 400 mg IV twice daily  Clindamycin 900 mg IV x 1 dose in ED  Height: 5\' 2"  (157.5 cm) Weight: 140 lb (63.5 kg) IBW/kg (Calculated) : 50.1  Temp (24hrs), Avg:98.4 F (36.9 C), Min:98.1 F (36.7 C), Max:98.7 F (37.1 C)  Recent Labs  Lab 01/03/18 0315 01/04/18 0559 01/05/18 0515 01/06/18 1301 01/06/18 1459  WBC 12.0* 7.5 4.0 5.4  --   CREATININE 0.57 0.54 0.53 0.49  --   LATICACIDVEN  --   --   --   --  1.0    Estimated Creatinine Clearance: 65.5 mL/min (by C-G formula based on SCr of 0.49 mg/dL).    Allergies  Allergen Reactions  . Codeine Itching  . Hydrocodone-Acetaminophen Itching  . Meloxicam Swelling    Face swelling  . Montelukast Cough    Antimicrobials this admission: Cipro 6/5 >>  Clindamycin 6/5 >> 6/5  Dose adjustments this admission: N/A  Microbiology results: 6/5 BCx: pending  Thank you for allowing pharmacy to be a part of this patient's care.  Ramond Craver 01/06/2018 5:08 PM

## 2018-01-06 NOTE — ED Provider Notes (Signed)
[facility] EMERGENCY DEPARTMENT Provider Note   CSN: [id number] Arrival date & time: 01/06/18  1236     History   Chief Complaint Chief Complaint  Patient presents with  . Foot Swelling    HPI Kristen Marsh is a 62 y.o. female.  HPI  Pt was seen at 1250. Per pt, c/o gradual onset and worsening of persistent right foot "redness" and "swelling" since being discharged from the hospital yesterday for cellulitis of her right foot. Pt states she has been taking her doxycycline as prescribed. Pt states she is "worried there's an abscess in there or something I stepped on." Denies new injury, though admits to "messnig with" the wound on the plantar right foot, 2nd MTP area. Denies fevers, no drainage, no focal motor weakness, no tingling/numbness in extremities.    Past Medical History:  Diagnosis Date  . Anxiety   . Cirrhosis of liver ([place])   . Depression   . Emphysema   . Fibromyalgia   . GERD (gastroesophageal reflux disease)   . Hep C w/o coma, chronic ([place])   . Hypertension   . IBS (irritable bowel syndrome)   . Kidney stones   . MRSA carrier   . Osteoarthritis   . Pneumonia    As a child  . 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 Shortness of breath    with exertion  . Varicose vein    Of esophagus and top of stomach per pt    Patient Active Problem List   Diagnosis Date Noted  . Cellulitis of foot, right   . Hypokalemia   . Cellulitis 01/03/2018  . MDD (major depressive disorder), recurrent severe, without psychosis ([place]) 05/18/2015  . Cocaine dependence with cocaine-induced mood disorder ([place])   . Alcohol dependence with alcohol-induced mood disorder ([place])   . Polysubstance abuse ([place]) 05/07/2015  . Ascites due to alcoholic cirrhosis ([place]) 50/93/2671  . Ascites 02/28/2015  . GAD (generalized anxiety disorder) 10/11/2014  . IBS (irritable bowel syndrome) 10/11/2014  . COPD (chronic obstructive pulmonary disease) ([place]) 10/15/2013  . Alcoholism /alcohol abuse ([place])  10/15/2013  . Cirrhosis of liver ([place]) 08/18/2013  . Degenerative cervical disc 08/18/2013  . Fibromyalgia 08/18/2013  . Muscle cramps 08/18/2013  . GERD (gastroesophageal reflux disease) 08/18/2013    Past Surgical History:  Procedure Laterality Date  . ABDOMINAL HYSTERECTOMY    . APPENDECTOMY    . COLONOSCOPY W/ BIOPSIES AND POLYPECTOMY    . DIAGNOSTIC LAPAROSCOPY     exploratory lap  . DIRECT LARYNGOSCOPY N/A 03/22/2014   Procedure: DIRECT LARYNGOSCOPY;  Surgeon: Ruby Cola, MD;  Location: [place];  Service: ENT;  Laterality: N/A;  With biopsy  . DIRECT LARYNGOSCOPY N/A 01/15/2015   Procedure: DIRECT LARYNGOSCOPY WITH BIOPSY;  Surgeon: Leta Baptist, MD;  Location: [place];  Service: ENT;  Laterality: N/A;  . ESOPHAGOGASTRODUODENOSCOPY (EGD) WITH PROPOFOL N/A 07/26/2014   Procedure: ESOPHAGOGASTRODUODENOSCOPY (EGD) WITH PROPOFOL;  Surgeon: Rogene Houston, MD;  Location: [facility];  Service: Endoscopy;  Laterality: N/A;  . HYSTEROSCOPY       OB History   None      Home Medications    Prior to Admission medications   Medication Sig Start Date End Date Taking? Authorizing Provider  albuterol (PROVENTIL) (2.5 MG/3ML) 0.083% nebulizer solution Take 3 mLs (2.5 mg total) by nebulization every 6 (six) hours as needed for wheezing or shortness of breath. 07/09/15   Sharion Balloon, FNP  budesonide-formoterol (SYMBICORT) 80-4.5 MCG/ACT  inhaler Inhale 2 puffs into the lungs 2 (two) times daily. Patient not taking: Reported on 06/24/2016 06/17/16   Evelina Dun A, FNP  cetirizine (ZYRTEC) 10 MG tablet TAKE ONE TABLET BY MOUTH ONCE DAILY. Patient not taking: Reported on 01/04/2018 06/30/16   Evelina Dun A, FNP  cyclobenzaprine (FLEXERIL) 5 MG tablet TAKE ONE TABLET BY MOUTH 3 TIMES DAILY AS NEEDED FOR MUSCLE SPASM(S). 01/05/18   Barton Dubois, MD  doxycycline (VIBRA-TABS) 100 MG tablet Take 1 tablet (100 mg total) by mouth every 12 (twelve) hours. 01/05/18   Barton Dubois, MD    ibuprofen (ADVIL,MOTRIN) 600 MG tablet Take 1 tablet (600 mg total) by mouth every 8 (eight) hours as needed for moderate pain. 01/05/18   Barton Dubois, MD  lactulose (CHRONULAC) 10 GM/15ML solution Take 30 mLs (20 g total) by mouth 2 (two) times daily. 01/05/18   Barton Dubois, MD  omeprazole (PRILOSEC) 20 MG capsule Take 1 capsule (20 mg total) by mouth 2 (two) times daily. 06/05/16   Evelina Dun A, FNP  Potassium Gluconate ([employer] POTASSIUM GLUCONATE) 2 MEQ TABS Take 595 mg by mouth 3 (three) times a week.     [provider]  pregabalin (LYRICA) 50 MG capsule Take 1 capsule (50 mg total) by mouth 3 (three) times daily. 01/05/18   Barton Dubois, MD  PROVENTIL HFA 108 [phone] Base) MCG/ACT inhaler INHALE 2 PUFFS INTO THE LUNGS EVERY FOUR HOURS AS NEEDED FOR WHEEZING. 06/16/16   Claretta Fraise, MD    Family History Family History  Problem Relation Age of Onset  . Cancer Mother        esophageal  . Cancer Brother        Liver cancer and cirrhosis age 76    Social History Social History   Tobacco Use  . Smoking status: Current Every Day Smoker    Packs/day: 0.50    Years: 40.00    Pack years: 20.00    Types: Cigarettes  . Smokeless tobacco: Never Used  . Tobacco comment: 1/2 pack a day at least 40 yrs  Substance Use Topics  . Alcohol use: Yes    Alcohol/week: 2.4 oz    Types: 4 Cans of beer per week    Comment: 1 smirnoff a day.   . Drug use: Yes    Types: Cocaine, "Crack" cocaine    Comment: Haven't used cocaine in the last 3 years     Allergies   Codeine; Hydrocodone-acetaminophen; Meloxicam; and Montelukast   Review of Systems Review of Systems ROS: Statement: All systems negative except as marked or noted in the HPI; Constitutional: Negative for fever and chills. ; ; Eyes: Negative for eye pain, redness and discharge. ; ; ENMT: Negative for ear pain, hoarseness, nasal congestion, sinus pressure and sore throat. ; ; Cardiovascular: Negative for chest pain,  palpitations, diaphoresis, dyspnea and peripheral edema. ; ; Respiratory: Negative for cough, wheezing and stridor. ; ; Gastrointestinal: Negative for nausea, vomiting, diarrhea, abdominal pain, blood in stool, hematemesis, jaundice and rectal bleeding. . ; ; Genitourinary: Negative for dysuria, flank pain and hematuria. ; ; Musculoskeletal: Negative for back pain and neck pain. Negative for swelling and trauma.; ; Skin: +rash. Negative for pruritus, abrasions, blisters, bruising and skin lesion.; ; Neuro: Negative for headache, lightheadedness and neck stiffness. Negative for weakness, altered level of consciousness, altered mental status, extremity weakness, paresthesias, involuntary movement, seizure and syncope.       Physical Exam Updated Vital Signs BP (!) 157/125 (BP Location:  Right Arm)   Pulse 98   Temp 98.7 F (37.1 C) (Oral)   Resp 20   Ht 5\' 2"  (1.575 m)   Wt 63.5 kg (140 lb)   SpO2 99%   BMI 25.61 kg/m   Physical Exam 1255: Physical examination:  Nursing notes reviewed; Vital signs and O2 SAT reviewed;  Constitutional: Well developed, Well nourished, Well hydrated, In no acute distress; Head:  Normocephalic, atraumatic; Eyes: EOMI, PERRL, No scleral icterus; ENMT: Mouth and pharynx normal, Mucous membranes moist; Neck: Supple, Full range of motion, No lymphadenopathy; Cardiovascular: Regular rate and rhythm, No gallop; Respiratory: Breath sounds clear & equal bilaterally, No wheezes.  Speaking full sentences with ease, Normal respiratory effort/excursion; Chest: Nontender, Movement normal; Abdomen: Soft, Nontender, Nondistended, Normal bowel sounds; Genitourinary: No CVA tenderness; Extremities: Peripheral pulses normal, +erythema and edema to right plantar and dorsal forefoot to mid-foot with localized tenderness. Small wound mid-dorsal forefoot, no drainage, no ecchymosis. Small scabbed wound right plantar forefoot 2nd MTP area, no drainage, no ecchymosis. No streaking. No calf  tenderness, edema or asymmetry. Chronic psoriatic rash right anterior tibial area..; Neuro: AA&Ox3, Major CN grossly intact.  Speech clear. No gross focal motor or sensory deficits in extremities.; Skin: Color normal, Warm, Dry.; Psych:  Pressured speech.     ED Treatments / Results  Labs (all labs ordered are listed, but only abnormal results are displayed)   EKG None  Radiology   Procedures Procedures (including critical care time)  Medications Ordered in ED Medications - No data to display   Initial Impression / Assessment and Plan / ED Course  I have reviewed the triage vital signs and the nursing notes.  Pertinent labs & imaging results that were available during my care of the patient were reviewed by me and considered in my medical decision making (see chart for details).  MDM Reviewed: previous chart, nursing note and vitals Reviewed previous: labs and x-ray Interpretation: labs and CT scan   Results for orders placed or performed during the hospital encounter of 66/06/30  Basic metabolic panel  Result Value Ref Range   Sodium 139 135 - 145 mmol/L   Potassium 4.0 3.5 - 5.1 mmol/L   Chloride 105 101 - 111 mmol/L   CO2 25 22 - 32 mmol/L   Glucose, Bld 94 65 - 99 mg/dL   BUN 10 6 - 20 mg/dL   Creatinine, Ser 0.49 0.44 - 1.00 mg/dL   Calcium 8.6 (L) 8.9 - 10.3 mg/dL   GFR calc non Af Amer >60 >60 mL/min   GFR calc Af Amer >60 >60 mL/min   Anion gap 9 5 - 15  Lactic acid, plasma  Result Value Ref Range   Lactic Acid, Venous 1.0 0.5 - 1.9 mmol/L  CBC with Differential  Result Value Ref Range   WBC 5.4 4.0 - 10.5 K/uL   RBC 4.35 3.87 - 5.11 MIL/uL   Hemoglobin 12.6 12.0 - 15.0 g/dL   HCT 38.2 36.0 - 46.0 %   MCV 87.8 78.0 - 100.0 fL   MCH 29.0 26.0 - 34.0 pg   MCHC 33.0 30.0 - 36.0 g/dL   RDW 16.2 (H) 11.5 - 15.5 %   Platelets 75 (L) 150 - 400 K/uL   Neutrophils Relative % 67 %   Neutro Abs 3.6 1.7 - 7.7 K/uL   Lymphocytes Relative 16 %   Lymphs Abs  0.8 0.7 - 4.0 K/uL   Monocytes Relative 15 %   Monocytes Absolute 0.8 0.1 -  1.0 K/uL   Eosinophils Relative 2 %   Eosinophils Absolute 0.1 0.0 - 0.7 K/uL   Basophils Relative 0 %   Basophils Absolute 0.0 0.0 - 0.1 K/uL   Dg Foot Complete Right Result Date: 01/03/2018 CLINICAL DATA:  Stepped on unknown object, with right great toe erythema and pain. Initial encounter. EXAM: RIGHT FOOT COMPLETE - 3+ VIEW COMPARISON:  None. FINDINGS: There is no evidence of fracture or dislocation. Subcortical cystic change at the distal first metatarsal is of uncertain significance. Would correlate clinically to exclude gout. The joint spaces are preserved. There is no evidence of talar subluxation; the subtalar joint is unremarkable in appearance. A plantar calcaneal spur is noted. Soft tissue swelling is noted about the great toe. No radiopaque foreign bodies are seen. IMPRESSION: 1. No evidence of fracture or dislocation. 2. Subcortical cystic change at the distal first metatarsal is of uncertain significance. Would correlate clinically to exclude gout. 3. Soft tissue swelling about the great toe. No radiopaque foreign bodies seen. Electronically Signed   By: Garald Balding M.D.   On: 01/03/2018 03:52   Ct Foot Right W Contrast Result Date: 01/06/2018 CLINICAL DATA:  Progressive cellulitis with swelling and redness and warmth of the right foot. EXAM: CT OF THE LOWER RIGHT EXTREMITY WITH CONTRAST TECHNIQUE: Multidetector CT imaging of the lower right extremity was performed according to the standard protocol following intravenous contrast administration. COMPARISON:  Radiographs dated 01/03/2018 CONTRAST:  36mL ISOVUE-300 IOPAMIDOL (ISOVUE-300) INJECTION 61% FINDINGS: Bones/Joint/Cartilage No significant abnormalities of the bones. No discrete osteomyelitis or joint effusion. Muscles and Tendons Negative. Soft tissues 3.5 x 2.0 x 2.8 cm soft tissue fluid collection in the ball of the foot at the level of the first and  second MTP joints. The fluid tracks between the heads of the first and second metatarsals toward the dorsum of the foot. There is nonspecific subcutaneous edema on the dorsum of the foot. No visible foreign bodies in the soft tissues. Nonspecific subcutaneous edema in the plantar aspect of the foot and heel. IMPRESSION: 1. Findings consistent with a soft tissue abscess of the ball of the foot at the level of the first and second MTP joints. The abscess extends between the heads of the first and second metatarsals. 2. No visible foreign body. 3. Subcutaneous edema on the dorsum of the foot is nonspecific but could represent cellulitis. 4. No osteomyelitis or appreciable joint effusion. Electronically Signed   By: Lorriane Shire M.D.   On: 01/06/2018 15:39     1255:  Right foot wounds appear similar to previous presentation on 6/2, but erythema and edema are significantly increased and encompass the entire plantar and dorsal forefoot areas. Given pt admitting to "messing with" dorsal foot wound (and intermittently picking at her dorsal foot during my and ED RN's exams), will obtain CT scan today to r/o FB, abscess, deep space infection. IV clindamycin ordered as well as BC.   1555:  T/C returned from Podiatry Dr. Caprice Beaver, case discussed, including:  HPI, pertinent PM/SHx, VS/PE, dx testing, ED course and treatment:  Agreeable to consult tomorrow morning.   1615:  T/C returned from [facility] Dr. Laren Everts, case discussed, including:  HPI, pertinent PM/SHx, VS/PE, dx testing, ED course and treatment:  Agreeable to admit.    Final Clinical Impressions(s) / ED Diagnoses   Final diagnoses:  None    ED Discharge Orders    None       Francine Graven, DO 01/11/18 1636

## 2018-01-06 NOTE — H&P (Signed)
[facility]                                                                                    Patient Demographics  Kristen Marsh, is a 62 y.o. female  CSN: [id number]  MRN: [id number]  DOB - [birth year]  Admit Date - 01/06/2018  Outpatient Primary MD for the patient is Patient, No Pcp Per   With History of -  Past Medical History:  Diagnosis Date  . Anxiety   . Cirrhosis of liver ([place])   . Depression   . Emphysema   . Fibromyalgia   . GERD (gastroesophageal reflux disease)   . Hep C w/o coma, chronic ([place])   . Hypertension   . IBS (irritable bowel syndrome)   . Kidney stones   . MRSA carrier   . Osteoarthritis   . Pneumonia    As a child  . 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 Shortness of breath    with exertion  . Varicose vein    Of esophagus and top of stomach per pt      Past Surgical History:  Procedure Laterality Date  . ABDOMINAL HYSTERECTOMY    . APPENDECTOMY    . COLONOSCOPY W/ BIOPSIES AND POLYPECTOMY    . DIAGNOSTIC LAPAROSCOPY     exploratory lap  . DIRECT LARYNGOSCOPY N/A 03/22/2014   Procedure: DIRECT LARYNGOSCOPY;  Surgeon: Ruby Cola, MD;  Location: [facility];  Service: ENT;  Laterality: N/A;  With biopsy  . DIRECT LARYNGOSCOPY N/A 01/15/2015   Procedure: DIRECT LARYNGOSCOPY WITH BIOPSY;  Surgeon: Leta Baptist, MD;  Location: [facility];  Service: ENT;  Laterality: N/A;  . ESOPHAGOGASTRODUODENOSCOPY (EGD) WITH PROPOFOL N/A 07/26/2014   Procedure: ESOPHAGOGASTRODUODENOSCOPY (EGD) WITH PROPOFOL;  Surgeon: Rogene Houston, MD;  Location: [facility];  Service: Endoscopy;  Laterality: N/A;  . HYSTEROSCOPY      in for   Chief Complaint  Patient presents with  . Foot Swelling     HPI  Kristen Marsh  is a 62 y.o. female, with past medical history significant for liver cirrhosis, emphysema, psoriasis and recent admission for right foot cellulitis, status post treatment with IV antibiotics and was sent home on p.o. doxycycline on 6/4,  presenting today with increased right foot swelling redness and pain.  CT of the right foot showed an abscess of the ball of the foot at the level of the first and second MTP joints.  No visible foreign body.  Podiatry was consulted and will evaluate the patient in a.m. patient was started on clindamycin and Cipro.  Blood cultures are taken and are pending.  Patient denies any fever or chills at home and reports they can medications as prescribed.    Review of Systems    In addition to the HPI above,  No Fever-chills, No Headache, No changes with Vision or hearing, No problems swallowing food or Liquids, No Chest pain, Cough or Shortness of Breath, No Abdominal pain, No Nausea or Vommitting, Bowel movements are regular, No Blood in stool or Urine, No dysuria,  No new weakness, tingling, numbness in any extremity, No recent weight gain or loss,  No polyuria, polydypsia or polyphagia, No significant Mental Stressors.  A full 10 point Review of Systems was done, except as stated above, all other Review of Systems were negative.   Social History Social History   Tobacco Use  . Smoking status: Current Every Day Smoker    Packs/day: 0.50    Years: 40.00    Pack years: 20.00    Types: Cigarettes  . Smokeless tobacco: Never Used  . Tobacco comment: 1/2 pack a day at least 40 yrs  Substance Use Topics  . Alcohol use: Yes    Alcohol/week: 2.4 oz    Types: 4 Cans of beer per week    Comment: 1 smirnoff a day.      Family History Family History  Problem Relation Age of Onset  . Cancer Mother        esophageal  . Cancer Brother        Liver cancer and cirrhosis age 42     Prior to Admission medications   Medication Sig Start Date End Date Taking? Authorizing Provider  albuterol (PROVENTIL) (2.5 MG/3ML) 0.083% nebulizer solution Take 3 mLs (2.5 mg total) by nebulization every 6 (six) hours as needed for wheezing or shortness of breath. 07/09/15  Yes Hawks, Christy A, FNP   cyclobenzaprine (FLEXERIL) 5 MG tablet TAKE ONE TABLET BY MOUTH 3 TIMES DAILY AS NEEDED FOR MUSCLE SPASM(S). 01/05/18  Yes Barton Dubois, MD  diphenhydrAMINE (BENADRYL) 25 MG tablet Take 50 mg by mouth every 6 (six) hours as needed.   Yes [provider]  doxycycline (VIBRA-TABS) 100 MG tablet Take 1 tablet (100 mg total) by mouth every 12 (twelve) hours. 01/05/18  Yes Barton Dubois, MD  ibuprofen (ADVIL,MOTRIN) 600 MG tablet Take 1 tablet (600 mg total) by mouth every 8 (eight) hours as needed for moderate pain. 01/05/18  Yes Barton Dubois, MD  lactulose (CHRONULAC) 10 GM/15ML solution Take 30 mLs (20 g total) by mouth 2 (two) times daily. 01/05/18  Yes Barton Dubois, MD  Multiple Vitamin (MULTIVITAMIN) tablet Take 1 tablet by mouth daily.   Yes [provider]  Potassium Gluconate ([employer] POTASSIUM GLUCONATE) 2 MEQ TABS Take 595 mg by mouth 3 (three) times a week.    Yes [provider]  pregabalin (LYRICA) 50 MG capsule Take 1 capsule (50 mg total) by mouth 3 (three) times daily. 01/05/18   Barton Dubois, MD  PROVENTIL HFA 108 [phone] Base) MCG/ACT inhaler INHALE 2 PUFFS INTO THE LUNGS EVERY FOUR HOURS AS NEEDED FOR WHEEZING. 06/16/16   Claretta Fraise, MD    Allergies  Allergen Reactions  . Codeine Itching  . Hydrocodone-Acetaminophen Itching  . Meloxicam Swelling    Face swelling  . Montelukast Cough    Physical Exam  Vitals  Blood pressure (!) 161/92, pulse 86, temperature 98.1 F (36.7 C), temperature source Oral, resp. rate 18, height 5\' 2"  (1.575 m), weight 63.5 kg (140 lb), SpO2 100 %.   1. General anxious, in no acute distress but in mild pain  2. Normal affect and insight, Not Suicidal or Homicidal, Awake Alert, Oriented X 3.  3. No F.N deficits, ALL C.Nerves Intact, Strength 5/5 all 4 extremities, Sensation intact all 4 extremities, Plantars down going.  4. Ears and Eyes appear Normal, Conjunctivae clear, PERRLA. Moist Oral Mucosa.  5. Supple Neck, No  JVD, No cervical lymphadenopathy appriciated, No Carotid Bruits.  6. Symmetrical Chest wall movement, Good air movement bilaterally, CTAB.  7. RRR, No Gallops, Rubs or Murmurs, No  Parasternal Heave.  8. Positive Bowel Sounds, Abdomen Soft, Non tender, No organomegaly appriciated,No rebound -guarding or rigidity.  9.  No Cyanosis, Normal Skin Turgor, No Skin Rash or Bruise.  Right foot plantar ulcer and swelling involving the MTP joints on the right foot noted.  10. Good muscle tone,  joints appear normal , no effusions, Normal ROM.    Data Review  CBC Recent Labs  Lab 01/03/18 0315 01/04/18 0559 01/05/18 0515 01/06/18 1301  WBC 12.0* 7.5 4.0 5.4  HGB 12.3 11.6* 11.7* 12.6  HCT 37.0 35.1* 36.5 38.2  PLT 103* 80* 78* 75*  MCV 86.4 86.7 87.7 87.8  MCH 28.7 28.6 28.1 29.0  MCHC 33.2 33.0 32.1 33.0  RDW 15.5 15.4 15.9* 16.2*  LYMPHSABS  --   --   --  0.8  MONOABS  --   --   --  0.8  EOSABS  --   --   --  0.1  BASOSABS  --   --   --  0.0   ------------------------------------------------------------------------------------------------------------------  Chemistries  Recent Labs  Lab 01/03/18 0315 01/04/18 0559 01/05/18 0515 01/06/18 1301  NA 138 136 139 139  K 3.0* 4.3 4.0 4.0  CL 104 106 105 105  CO2 28 26 29 25   GLUCOSE 128* 167* 95 94  BUN 11 11 8 10   CREATININE 0.57 0.54 0.53 0.49  CALCIUM 8.6* 8.5* 8.1* 8.6*  MG  --  1.8  --   --   AST 81* 45*  --   --   ALT 51 40  --   --   ALKPHOS 69 66  --   --   BILITOT 2.1* 0.8  --   --    ------------------------------------------------------------------------------------------------------------------ estimated creatinine clearance is 65.5 mL/min (by C-G formula based on SCr of 0.49 mg/dL). ------------------------------------------------------------------------------------------------------------------ No results for input(s): TSH, T4TOTAL, T3FREE, THYROIDAB in the last 72 hours.  Invalid input(s):  FREET3   Coagulation profile No results for input(s): INR, PROTIME in the last 168 hours. ------------------------------------------------------------------------------------------------------------------- No results for input(s): DDIMER in the last 72 hours. -------------------------------------------------------------------------------------------------------------------  Cardiac Enzymes No results for input(s): CKMB, TROPONINI, MYOGLOBIN in the last 168 hours.  Invalid input(s): CK ------------------------------------------------------------------------------------------------------------------ Invalid input(s): POCBNP   ---------------------------------------------------------------------------------------------------------------  Urinalysis    Component Value Date/Time   COLORURINE AMBER (A) 05/13/2015 0630   APPEARANCEUR CLOUDY (A) 05/13/2015 0630   LABSPEC 1.022 05/13/2015 0630   PHURINE 6.0 05/13/2015 0630   GLUCOSEU NEGATIVE 05/13/2015 0630   HGBUR TRACE (A) 05/13/2015 0630   BILIRUBINUR NEGATIVE 05/13/2015 0630   BILIRUBINUR neg 03/09/2014 1443   KETONESUR NEGATIVE 05/13/2015 0630   PROTEINUR NEGATIVE 05/13/2015 0630   UROBILINOGEN 1.0 05/13/2015 0630   NITRITE POSITIVE (A) 05/13/2015 0630   LEUKOCYTESUR MODERATE (A) 05/13/2015 0630    ----------------------------------------------------------------------------------------------------------------   Imaging results:   Ct Foot Right W Contrast  Result Date: 01/06/2018 CLINICAL DATA:  Progressive cellulitis with swelling and redness and warmth of the right foot. EXAM: CT OF THE LOWER RIGHT EXTREMITY WITH CONTRAST TECHNIQUE: Multidetector CT imaging of the lower right extremity was performed according to the standard protocol following intravenous contrast administration. COMPARISON:  Radiographs dated 01/03/2018 CONTRAST:  46mL ISOVUE-300 IOPAMIDOL (ISOVUE-300) INJECTION 61% FINDINGS: Bones/Joint/Cartilage No  significant abnormalities of the bones. No discrete osteomyelitis or joint effusion. Muscles and Tendons Negative. Soft tissues 3.5 x 2.0 x 2.8 cm soft tissue fluid collection in the ball of the foot at the level of the first and second MTP joints. The fluid  tracks between the heads of the first and second metatarsals toward the dorsum of the foot. There is nonspecific subcutaneous edema on the dorsum of the foot. No visible foreign bodies in the soft tissues. Nonspecific subcutaneous edema in the plantar aspect of the foot and heel. IMPRESSION: 1. Findings consistent with a soft tissue abscess of the ball of the foot at the level of the first and second MTP joints. The abscess extends between the heads of the first and second metatarsals. 2. No visible foreign body. 3. Subcutaneous edema on the dorsum of the foot is nonspecific but could represent cellulitis. 4. No osteomyelitis or appreciable joint effusion. Electronically Signed   By: Lorriane Shire M.D.   On: 01/06/2018 15:39   Dg Foot Complete Right  Result Date: 01/03/2018 CLINICAL DATA:  Stepped on unknown object, with right great toe erythema and pain. Initial encounter. EXAM: RIGHT FOOT COMPLETE - 3+ VIEW COMPARISON:  None. FINDINGS: There is no evidence of fracture or dislocation. Subcortical cystic change at the distal first metatarsal is of uncertain significance. Would correlate clinically to exclude gout. The joint spaces are preserved. There is no evidence of talar subluxation; the subtalar joint is unremarkable in appearance. A plantar calcaneal spur is noted. Soft tissue swelling is noted about the great toe. No radiopaque foreign bodies are seen. IMPRESSION: 1. No evidence of fracture or dislocation. 2. Subcortical cystic change at the distal first metatarsal is of uncertain significance. Would correlate clinically to exclude gout. 3. Soft tissue swelling about the great toe. No radiopaque foreign bodies seen. Electronically Signed   By: Garald Balding M.D.   On: 01/03/2018 03:52      Assessment & Plan  1.  Right foot abscess , recurrent, status post IV vancomycin and Zosyn prior    IV clindamycin and Cipro    Consulted podiatry will see patient in a.m.    Possible surgery in a.m. for right foot abscess    Follow cultures 2.  History of liver cirrhosis 3.  Allergies discussed with patient.  Patient can tolerate OxyIR and she can take Tylenol but afraid of the liver cirrhosis.  We will decrease the dose.      DVT Prophylaxis Heparin  AM Labs Ordered, also please review Full Orders  Family Communication: Admission, patients condition and plan of care including tests being ordered have been discussed with the patient and son who indicate understanding and agree with the plan and Code Status.  Code Status full  Disposition Plan: Home  Time spent in minutes : 38 minutes  Condition guarded   @SIGNATURE @

## 2018-01-06 NOTE — ED Notes (Signed)
Foot redness and swelling marked

## 2018-01-06 NOTE — ED Notes (Signed)
Attempt IV placement x2 in left arm.  Will have second RN attempt

## 2018-01-07 ENCOUNTER — Other Ambulatory Visit: Payer: Self-pay | Admitting: Podiatry

## 2018-01-07 ENCOUNTER — Inpatient Hospital Stay (HOSPITAL_COMMUNITY): Payer: Medicaid Other | Admitting: Anesthesiology

## 2018-01-07 ENCOUNTER — Encounter (HOSPITAL_COMMUNITY): Payer: Self-pay | Admitting: *Deleted

## 2018-01-07 ENCOUNTER — Encounter (HOSPITAL_COMMUNITY)
Admission: EM | Disposition: A | Discharge status: Home Health | Payer: Self-pay | Source: Home / Self Care | Attending: Internal Medicine

## 2018-01-07 DIAGNOSIS — L02611 Cutaneous abscess of right foot: Principal | ICD-10-CM

## 2018-01-07 HISTORY — PX: INCISION AND DRAINAGE: SHX5863

## 2018-01-07 LAB — PROTIME-INR
INR: 1.33
PROTHROMBIN TIME: 16.3 s — AB (ref 11.4–15.2)

## 2018-01-07 LAB — CBC
HCT: 37.5 % (ref 36.0–46.0)
Hemoglobin: 12.2 g/dL (ref 12.0–15.0)
MCH: 28.4 pg (ref 26.0–34.0)
MCHC: 32.5 g/dL (ref 30.0–36.0)
MCV: 87.4 fL (ref 78.0–100.0)
PLATELETS: 102 10*3/uL — AB (ref 150–400)
RBC: 4.29 MIL/uL (ref 3.87–5.11)
RDW: 16.4 % — AB (ref 11.5–15.5)
WBC: 7.6 10*3/uL (ref 4.0–10.5)

## 2018-01-07 LAB — BASIC METABOLIC PANEL
Anion gap: 8 (ref 5–15)
BUN: 8 mg/dL (ref 6–20)
CO2: 25 mmol/L (ref 22–32)
CREATININE: 0.56 mg/dL (ref 0.44–1.00)
Calcium: 8.4 mg/dL — ABNORMAL LOW (ref 8.9–10.3)
Chloride: 103 mmol/L (ref 101–111)
GFR calc Af Amer: >60 mL/min (ref >60)
GLUCOSE: 89 mg/dL (ref 65–99)
POTASSIUM: 4 mmol/L (ref 3.5–5.1)
SODIUM: 136 mmol/L (ref 135–145)

## 2018-01-07 SURGERY — INCISION AND DRAINAGE
Anesthesia: General | Laterality: Right

## 2018-01-07 MED ORDER — LIDOCAINE HCL (PF) 1 % IJ SOLN
INTRAMUSCULAR | Status: AC
  Filled 2018-01-07: qty 30

## 2018-01-07 MED ORDER — PROPOFOL 10 MG/ML IV BOLUS
INTRAVENOUS | Status: DC | PRN
  Administered 2018-01-07 (×3): 10 mg via INTRAVENOUS

## 2018-01-07 MED ORDER — MEPERIDINE HCL 50 MG/ML IJ SOLN: 6.2500 mg | INTRAMUSCULAR | Status: DC | PRN

## 2018-01-07 MED ORDER — SODIUM CHLORIDE 0.9 % IR SOLN
Status: DC | PRN
  Administered 2018-01-07: 3000 mL

## 2018-01-07 MED ORDER — ONDANSETRON HCL 4 MG/2ML IJ SOLN: 4.0000 mg | Freq: Once | INTRAMUSCULAR | Status: DC | PRN

## 2018-01-07 MED ORDER — SODIUM CHLORIDE 0.9 % IJ SOLN
INTRAMUSCULAR | Status: AC
  Filled 2018-01-07: qty 10

## 2018-01-07 MED ORDER — FENTANYL CITRATE (PF) 100 MCG/2ML IJ SOLN: 50.0000 ug | INTRAMUSCULAR | Status: DC | PRN

## 2018-01-07 MED ORDER — MORPHINE SULFATE (PF) 4 MG/ML IV SOLN
4.0000 mg | INTRAVENOUS | Status: DC | PRN
  Administered 2018-01-07 – 2018-01-09 (×9): 4 mg via INTRAVENOUS
  Filled 2018-01-07 (×9): qty 1

## 2018-01-07 MED ORDER — HYDROMORPHONE HCL 1 MG/ML IJ SOLN: 0.2500 mg | INTRAMUSCULAR | Status: DC | PRN

## 2018-01-07 MED ORDER — PROPOFOL 500 MG/50ML IV EMUL
INTRAVENOUS | Status: DC | PRN
  Administered 2018-01-07: 50 ug/kg/min via INTRAVENOUS

## 2018-01-07 MED ORDER — DEXMEDETOMIDINE HCL IN NACL 200 MCG/50ML IV SOLN
INTRAVENOUS | Status: DC | PRN
  Administered 2018-01-07: 20 ug via INTRAVENOUS
  Administered 2018-01-07: 60 ug via INTRAVENOUS
  Administered 2018-01-07 (×2): 20 ug via INTRAVENOUS
  Administered 2018-01-07: 60 ug via INTRAVENOUS
  Administered 2018-01-07: 20 ug via INTRAVENOUS

## 2018-01-07 MED ORDER — BUPIVACAINE HCL (PF) 0.5 % IJ SOLN
INTRAMUSCULAR | Status: DC | PRN
  Administered 2018-01-07: 16 mL

## 2018-01-07 MED ORDER — HYDROCODONE-ACETAMINOPHEN 7.5-325 MG PO TABS: 1.0000 | ORAL_TABLET | Freq: Once | ORAL | Status: DC | PRN

## 2018-01-07 MED ORDER — MORPHINE SULFATE (PF) 4 MG/ML IV SOLN
4.0000 mg | INTRAVENOUS | Status: DC | PRN
  Administered 2018-01-07 (×3): 4 mg via INTRAVENOUS
  Filled 2018-01-07 (×3): qty 1

## 2018-01-07 MED ORDER — DEXMEDETOMIDINE HCL 200 MCG/2ML IV SOLN
INTRAVENOUS | Status: AC
  Filled 2018-01-07: qty 2

## 2018-01-07 MED ORDER — LIDOCAINE HCL (PF) 1 % IJ SOLN
INTRAMUSCULAR | Status: DC | PRN
  Administered 2018-01-07: 20 mL

## 2018-01-07 MED ORDER — BUPIVACAINE HCL (PF) 0.5 % IJ SOLN
INTRAMUSCULAR | Status: AC
  Filled 2018-01-07: qty 30

## 2018-01-07 MED ORDER — LACTATED RINGERS IV SOLN
INTRAVENOUS | Status: DC
  Administered 2018-01-07: 1000 mL via INTRAVENOUS

## 2018-01-07 SURGICAL SUPPLY — 30 items
BANDAGE ELASTIC 4 LF NS (GAUZE/BANDAGES/DRESSINGS) ×3 IMPLANT
BANDAGE ESMARK 4X12 BL STRL LF (DISPOSABLE) ×1 IMPLANT
BNDG CONFORM 2 STRL LF (GAUZE/BANDAGES/DRESSINGS) ×3 IMPLANT
BNDG ESMARK 4X12 BLUE STRL LF (DISPOSABLE) ×3
BNDG GAUZE ELAST 4 BULKY (GAUZE/BANDAGES/DRESSINGS) ×3 IMPLANT
CLOTH BEACON ORANGE TIMEOUT ST (SAFETY) ×3 IMPLANT
COVER LIGHT HANDLE STERIS (MISCELLANEOUS) ×6 IMPLANT
CUFF TOURNIQUET SINGLE 18IN (TOURNIQUET CUFF) IMPLANT
DECANTER SPIKE VIAL GLASS SM (MISCELLANEOUS) ×6 IMPLANT
DRSG ADAPTIC 3X8 NADH LF (GAUZE/BANDAGES/DRESSINGS) ×3 IMPLANT
ELECT REM PT RETURN 9FT ADLT (ELECTROSURGICAL) ×3
ELECTRODE REM PT RTRN 9FT ADLT (ELECTROSURGICAL) ×1 IMPLANT
GAUZE PACKING IODOFORM 1/4X15 (GAUZE/BANDAGES/DRESSINGS) ×3 IMPLANT
GAUZE SPONGE 4X4 12PLY STRL (GAUZE/BANDAGES/DRESSINGS) ×3 IMPLANT
GLOVE BIO SURGEON STRL SZ7.5 (GLOVE) ×3 IMPLANT
GLOVE BIOGEL PI IND STRL 7.0 (GLOVE) ×2 IMPLANT
GLOVE BIOGEL PI INDICATOR 7.0 (GLOVE) ×4
GOWN STRL REUS W/TWL LRG LVL3 (GOWN DISPOSABLE) ×6 IMPLANT
HANDPIECE INTERPULSE COAX TIP (DISPOSABLE) ×2
IV NS IRRIG 3000ML ARTHROMATIC (IV SOLUTION) ×3 IMPLANT
KIT TURNOVER KIT A (KITS) ×3 IMPLANT
MARKER SKIN DUAL TIP RULER LAB (MISCELLANEOUS) ×3 IMPLANT
NEEDLE HYPO 22GX1.5 SAFETY (NEEDLE) ×3 IMPLANT
NEEDLE HYPO 27GX1-1/4 (NEEDLE) ×3 IMPLANT
NS IRRIG 1000ML POUR BTL (IV SOLUTION) ×3 IMPLANT
PACK BASIC LIMB (CUSTOM PROCEDURE TRAY) ×3 IMPLANT
PAD ARMBOARD 7.5X6 YLW CONV (MISCELLANEOUS) ×3 IMPLANT
SET BASIN LINEN APH (SET/KITS/TRAYS/PACK) ×3 IMPLANT
SET HNDPC FAN SPRY TIP SCT (DISPOSABLE) ×1 IMPLANT
SYR CONTROL 10ML LL (SYRINGE) ×9 IMPLANT

## 2018-01-07 NOTE — Progress Notes (Signed)
PROGRESS NOTE    Kristen Marsh  XBM:[id number] DOB: [birth year] DOA: 01/06/2018 PCP: Patient, No Pcp Per   Brief Narrative:   Chanae Gemma  is a 62 y.o. female, with past medical history significant for liver cirrhosis, emphysema, psoriasis and recent admission for right foot cellulitis, status post treatment with IV antibiotics and was sent home on p.o. doxycycline on 6/4, presenting on 6/5 with increased right foot swelling redness and pain.  CT of the right foot showed an abscess of the ball of the foot at the level of the first and second MTP joints.  He has been started on IV clindamycin and ciprofloxacin with plans for incision and drainage of right foot per podiatry.   Assessment & Plan:   Active Problems:   Foot abscess, right   1. Right foot abscess with recent cellulitis.  Continue current antibiotics with IV clindamycin and ciprofloxacin and follow cultures.  Podiatry with plans for incision and drainage today.  Pain control with IV medications as needed. 2. History of liver cirrhosis. 3. History of alcohol abuse.  Continue to monitor closely with Ativan as needed for anxiety. 4. History of polysubstance abuse.  Will provide substance abuse counseling. 5. COPD/asthma.  No acute bronchospasms currently noted.   DVT prophylaxis:Heparin Code Status: Full Family Communication: Son at bedside Disposition Plan: I/D of abscess and follow cultures; pain management   Consultants:   Podiatry  Procedures:   None  Antimicrobials:   IV Clindamycin/Ciprofloxacin 6/5->   Subjective: Patient seen and evaluated today with ongoing pain complaints noted to her R foot.  No other acute events noted since admission.  Objective: Vitals:   01/06/18 1809 01/06/18 1814 01/07/18 0602 01/07/18 1055  BP:  (!) 158/84 (!) 142/77 135/76  Pulse:  84 92 81  Resp:  20 18 20   Temp:  98.2 F (36.8 C) 98.9 F (37.2 C) 98.5 F (36.9 C)  TempSrc:  Oral Oral Oral  SpO2:  98% 99% 97%    Weight: 63.5 kg (140 lb)     Height: 5\' 2"  (1.575 m)       Intake/Output Summary (Last 24 hours) at 01/07/2018 1129 Last data filed at 01/07/2018 0816 Gross per 24 hour  Intake 500 ml  Output 700 ml  Net -200 ml   Filed Weights   01/06/18 1241 01/06/18 1809  Weight: 63.5 kg (140 lb) 63.5 kg (140 lb)    Examination:  General exam: Mild to moderate distress due to pain. Respiratory system: Clear to auscultation. Respiratory effort normal. Cardiovascular system: S1 & S2 heard, RRR. No JVD, murmurs, rubs, gallops or clicks. No pedal edema. Gastrointestinal system: Abdomen is nondistended, soft and nontender. No organomegaly or masses felt. Normal bowel sounds heard. Central nervous system: Alert and oriented. No focal neurological deficits. Extremities: Symmetric 5 x 5 power. Skin: Right foot marked with minimal erythema noted.  No draining abscess currently.  Data Reviewed: I have personally reviewed following labs and imaging studies  CBC: Recent Labs  Lab 01/03/18 0315 01/04/18 0559 01/05/18 0515 01/06/18 1301 01/07/18 0454  WBC 12.0* 7.5 4.0 5.4 7.6  NEUTROABS  --   --   --  3.6  --   HGB 12.3 11.6* 11.7* 12.6 12.2  HCT 37.0 35.1* 36.5 38.2 37.5  MCV 86.4 86.7 87.7 87.8 87.4  PLT 103* 80* 78* 75* 027*   Basic Metabolic Panel: Recent Labs  Lab 01/03/18 0315 01/04/18 0559 01/05/18 0515 01/06/18 1301 01/07/18 0454  NA 138 136 139 139 136  K 3.0* 4.3 4.0 4.0 4.0  CL 104 106 105 105 103  CO2 28 26 29 25 25   GLUCOSE 128* 167* 95 94 89  BUN 11 11 8 10 8   CREATININE 0.57 0.54 0.53 0.49 0.56  CALCIUM 8.6* 8.5* 8.1* 8.6* 8.4*  MG  --  1.8  --   --   --    GFR: Estimated Creatinine Clearance: 65.5 mL/min (by C-G formula based on SCr of 0.56 mg/dL). Liver Function Tests: Recent Labs  Lab 01/03/18 0315 01/04/18 0559  AST 81* 45*  ALT 51 40  ALKPHOS 69 66  BILITOT 2.1* 0.8  PROT 6.9 6.0*  ALBUMIN 3.5 2.9*   No results for input(s): LIPASE, AMYLASE in the  last 168 hours. Recent Labs  Lab 01/03/18 0315  AMMONIA 100*   Coagulation Profile: Recent Labs  Lab 01/07/18 0454  INR 1.33   Cardiac Enzymes: No results for input(s): CKTOTAL, CKMB, CKMBINDEX, TROPONINI in the last 168 hours. BNP (last 3 results) No results for input(s): PROBNP in the last 8760 hours. HbA1C: No results for input(s): HGBA1C in the last 72 hours. CBG: No results for input(s): GLUCAP in the last 168 hours. Lipid Profile: No results for input(s): CHOL, HDL, LDLCALC, TRIG, CHOLHDL, LDLDIRECT in the last 72 hours. Thyroid Function Tests: No results for input(s): TSH, T4TOTAL, FREET4, T3FREE, THYROIDAB in the last 72 hours. Anemia Panel: No results for input(s): VITAMINB12, FOLATE, FERRITIN, TIBC, IRON, RETICCTPCT in the last 72 hours. Sepsis Labs: Recent Labs  Lab 01/06/18 1459  LATICACIDVEN 1.0    Recent Results (from the past 240 hour(s))  MRSA PCR Screening     Status: None   Collection Time: 01/03/18  8:30 AM  Result Value Ref Range Status   MRSA by PCR NEGATIVE NEGATIVE Final    Comment:        The GeneXpert MRSA Assay (FDA approved for NASAL specimens only), is one component of a comprehensive MRSA colonization surveillance program. It is not intended to diagnose MRSA infection nor to guide or monitor treatment for MRSA infections. Performed at [facility], [address], [place], [place] [address]   Culture, blood (routine x 2)     Status: None (Preliminary result)   Collection Time: 01/06/18  3:49 PM  Result Value Ref Range Status   Specimen Description BLOOD RIGHT HAND  Final   Special Requests   Final    BOTTLES DRAWN AEROBIC AND ANAEROBIC Blood Culture adequate volume Performed at [facility], [address], [place], [place] [address]    Culture PENDING  Incomplete   Report Status PENDING  Incomplete  Culture, blood (routine x 2)     Status: None (Preliminary result)   Collection Time: 01/06/18  4:09 PM  Result Value Ref Range  Status   Specimen Description BLOOD RIGHT HAND  Final   Special Requests   Final    BOTTLES DRAWN AEROBIC ONLY Blood Culture adequate volume Performed at [facility], [address], [place],  [address]    Culture PENDING  Incomplete   Report Status PENDING  Incomplete      Radiology Studies: Ct Foot Right W Contrast  Result Date: 01/06/2018 CLINICAL DATA:  Progressive cellulitis with swelling and redness and warmth of the right foot. EXAM: CT OF THE LOWER RIGHT EXTREMITY WITH CONTRAST TECHNIQUE: Multidetector CT imaging of the lower right extremity was performed according to the standard protocol following intravenous contrast administration. COMPARISON:  Radiographs dated 01/03/2018 CONTRAST:  37mL ISOVUE-300 IOPAMIDOL (  ISOVUE-300) INJECTION 61% FINDINGS: Bones/Joint/Cartilage No significant abnormalities of the bones. No discrete osteomyelitis or joint effusion. Muscles and Tendons Negative. Soft tissues 3.5 x 2.0 x 2.8 cm soft tissue fluid collection in the ball of the foot at the level of the first and second MTP joints. The fluid tracks between the heads of the first and second metatarsals toward the dorsum of the foot. There is nonspecific subcutaneous edema on the dorsum of the foot. No visible foreign bodies in the soft tissues. Nonspecific subcutaneous edema in the plantar aspect of the foot and heel. IMPRESSION: 1. Findings consistent with a soft tissue abscess of the ball of the foot at the level of the first and second MTP joints. The abscess extends between the heads of the first and second metatarsals. 2. No visible foreign body. 3. Subcutaneous edema on the dorsum of the foot is nonspecific but could represent cellulitis. 4. No osteomyelitis or appreciable joint effusion. Electronically Signed   By: Lorriane Shire M.D.   On: 01/06/2018 15:39        Scheduled Meds: . [MAR Hold] heparin  5,000 Units Subcutaneous Q8H  . [MAR Hold] lactulose  20 g Oral BID  . [MAR Hold]  multivitamin with minerals  1 tablet Oral Daily  . [MAR Hold] Potassium Gluconate  1 tablet Oral Once per day on Mon Wed Fri  . [MAR Hold] sodium chloride flush  3 mL Intravenous Q12H   Continuous Infusions: . [MAR Hold] sodium chloride    . [MAR Hold] ciprofloxacin Stopped (01/07/18 0702)  . [MAR Hold] clindamycin (CLEOCIN) IV 600 mg (01/07/18 0856)  . lactated ringers 1,000 mL (01/07/18 1100)     LOS: 1 day    Time spent: 30 minutes    Jacquilyn Seldon Darleen Crocker, DO [facility] Pager [phone]  If 7PM-7AM, please contact night-coverage [email or web address] Password [email or web address] 01/07/2018, 11:29 AM

## 2018-01-07 NOTE — Transfer of Care (Signed)
Immediate Anesthesia Transfer of Care Note  Patient: Kristen Marsh  Procedure(s) Performed: INCISION AND DRAINAGE OF ABSCESS RIGHT FOOT (Right )  Patient Location: PACU  Anesthesia Type:MAC  Level of Consciousness: sedated  Airway & Oxygen Therapy: Patient Spontanous Breathing  Post-op Assessment: Report given to RN  Post vital signs: Reviewed and stable  Last Vitals:  Vitals Value Taken Time  BP 141/70 01/07/2018 12:20 PM  Temp 36.7 C 01/07/2018 12:20 PM  Pulse 65 01/07/2018 12:24 PM  Resp 17 01/07/2018 12:24 PM  SpO2 95 % 01/07/2018 12:24 PM  Vitals shown include unvalidated device data.  Last Pain:  Vitals:   01/07/18 1055  TempSrc: Oral  PainSc: 10-Worst pain ever      Patients Stated Pain Goal: 3 (64/68/03 2122)  Complications: No apparent anesthesia complications

## 2018-01-07 NOTE — Op Note (Signed)
OPERATIVE NOTE  DATE OF PROCEDURE 01/07/2018  SURGEON Marcheta Grammes, DPM  ASSISTANT SURGEON Jilda Panda, DPM  OR STAFF Circulator: Glory Rosebush, RN Scrub Person: Karin Lieu, CST   PREOPERATIVE DIAGNOSIS Abcess, right foot  POSTOPERATIVE DIAGNOSIS Same  PROCEDURE Incision and drainage of abscess, right foot  ANESTHESIA Monitor Anesthesia Care   HEMOSTASIS Pneumatic ankle tourniquet set at 250 mmHg  ESTIMATED BLOOD LOSS Minimal (<5 cc)  MATERIALS USED Iodoform packing  INJECTABLES 0.5% Marcaine plain 1% Lidocaine plain  PATHOLOGY Aerobic culture Anaerobic culture  COMPLICATIONS None  INDICATIONS:  Abscess of right foot confirmed the CT.  DESCRIPTION OF THE PROCEDURE:  The patient was brought to the operating room and placed on the operative table in the supine position.  A pneumatic ankle tourniquet was applied to the operative extremity.  Following sedation, the surgical site was anesthetized with 0.5% Marcaine plain.  The foot was then prepped, scrubbed, and draped in the usual sterile technique.  The foot was elevated, exsanguinated and the pneumatic ankle tourniquet inflated to 250 mmHg.    Attention was directed to the plantar aspect of the right first intermetatarsal space where an area of fluctuance was encountered.  Linear incision was made using a #15 blade.  Blunt dissection of performed was performed using a hemostat.  Approximately 10 cc of purulence was expressed.  20 cc of 1% lidocaine plain was injected proximal to the surgical site.  The abscess tracked dorsally through the first interspace to the dorsum of the foot.  Aerobic anaerobic cultures were obtained.  The wound was irrigated with 3 L of sterile saline using pulse lavage.  The surgical wound was packed with iodoform gauze.  A sterile compressive dressing was applied to the right foot.  The pneumatic ankle tourniquet was deflated and a prompt hyperemic response was noted to all  digits of the right foot.   The patient tolerated the procedure well.  The patient was then transferred to PACU with vital signs stable and vascular status intact to all toes of the operative foot.

## 2018-01-07 NOTE — Anesthesia Preprocedure Evaluation (Signed)
Anesthesia Evaluation  Patient identified by MRN, date of birth, ID band Patient awake    Reviewed: Allergy & Precautions, H&P , NPO status , Patient's Chart, lab work & pertinent test results  Airway Mallampati: II  TM Distance: >3 FB Neck ROM: full    Dental no notable dental hx. (+) Poor Dentition, Missing   Pulmonary neg pulmonary ROS, shortness of breath, pneumonia, COPD, Current Smoker,    Pulmonary exam normal breath sounds clear to auscultation       Cardiovascular Exercise Tolerance: Good hypertension, negative cardio ROS   Rhythm:regular Rate:Normal     Neuro/Psych PSYCHIATRIC DISORDERS Anxiety Depression  Neuromuscular disease negative neurological ROS  negative psych ROS   GI/Hepatic negative GI ROS, Neg liver ROS, GERD  ,(+) Cirrhosis       , Hepatitis -, C  Endo/Other  negative endocrine ROS  Renal/GU Renal diseasenegative Renal ROS  negative genitourinary   Musculoskeletal   Abdominal   Peds  Hematology negative hematology ROS (+)   Anesthesia Other Findings Alcohol use: Yes; 2.4 oz per week Drug use: Cocaine, "Crack" cocaine    Reproductive/Obstetrics negative OB ROS                             Anesthesia Physical Anesthesia Plan  ASA: III  Anesthesia Plan: General   Post-op Pain Management:    Induction:   PONV Risk Score and Plan:   Airway Management Planned:   Additional Equipment:   Intra-op Plan:   Post-operative Plan:   Informed Consent: I have reviewed the patients History and Physical, chart, labs and discussed the procedure including the risks, benefits and alternatives for the proposed anesthesia with the patient or authorized representative who has indicated his/her understanding and acceptance.   Dental Advisory Given  Plan Discussed with: CRNA  Anesthesia Plan Comments:         Anesthesia Quick Evaluation

## 2018-01-07 NOTE — Consult Note (Signed)
Podiatry Consult Note  Reason for Consultation:  Abscess right foot.  History of Present Illness: Kristen Marsh is a 62 y.o. female with history of recent admission for cellulitis of right foot.  Discharged on 01/05/2018 on PO doxycycline.  Returned to ER 01/06/2018 with increased redness, swelling and pain of right foot.  CT revealed abscess at level of 1st and 2nd MTPJ.  No visible foreign body.    Patient thinks that she may have stepped on piece of glass about 2 weeks ago.  She has "picked" at the area.  History of cocaine use about 2 weeks ago.  No recent IV drug use.  Denies injecting foot with illicit drugs.  Patient has been NPO since midnight.  Son is present at bedside.   Past Medical History:  Diagnosis Date  . Anxiety   . Cirrhosis of liver ([place])   . Depression   . Emphysema   . Fibromyalgia   . GERD (gastroesophageal reflux disease)   . Hep C w/o coma, chronic ([place])   . Hypertension   . IBS (irritable bowel syndrome)   . Kidney stones   . MRSA carrier   . Osteoarthritis   . Pneumonia    As a child  . 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 Shortness of breath    with exertion  . Varicose vein    Of esophagus and top of stomach per pt   Scheduled Meds: . heparin  5,000 Units Subcutaneous Q8H  . lactulose  20 g Oral BID  . multivitamin with minerals  1 tablet Oral Daily  . Potassium Gluconate   Oral Once per day on Mon Wed Fri  . sodium chloride flush  3 mL Intravenous Q12H   Continuous Infusions: . sodium chloride    . ciprofloxacin Stopped (01/07/18 2202)  . clindamycin (CLEOCIN) IV 600 mg (01/07/18 0856)   PRN Meds:.sodium chloride, acetaminophen **OR** acetaminophen, albuterol, bisacodyl, cyclobenzaprine, diphenhydrAMINE, fentaNYL (SUBLIMAZE) injection, morphine injection, ondansetron **OR** ondansetron (ZOFRAN) IV, oxyCODONE, senna-docusate, sodium chloride flush, zolpidem  Allergies  Allergen Reactions  . Codeine Itching  . Hydrocodone-Acetaminophen Itching   . Meloxicam Swelling    Face swelling  . Montelukast Cough   Past Surgical History:  Procedure Laterality Date  . ABDOMINAL HYSTERECTOMY    . APPENDECTOMY    . COLONOSCOPY W/ BIOPSIES AND POLYPECTOMY    . DIAGNOSTIC LAPAROSCOPY     exploratory lap  . DIRECT LARYNGOSCOPY N/A 03/22/2014   Procedure: DIRECT LARYNGOSCOPY;  Surgeon: Ruby Cola, MD;  Location: [facility];  Service: ENT;  Laterality: N/A;  With biopsy  . DIRECT LARYNGOSCOPY N/A 01/15/2015   Procedure: DIRECT LARYNGOSCOPY WITH BIOPSY;  Surgeon: Leta Baptist, MD;  Location: [facility];  Service: ENT;  Laterality: N/A;  . ESOPHAGOGASTRODUODENOSCOPY (EGD) WITH PROPOFOL N/A 07/26/2014   Procedure: ESOPHAGOGASTRODUODENOSCOPY (EGD) WITH PROPOFOL;  Surgeon: Rogene Houston, MD;  Location: [facility];  Service: Endoscopy;  Laterality: N/A;  . HYSTEROSCOPY     Family History  Problem Relation Age of Onset  . Cancer Mother        esophageal  . Cancer Brother        Liver cancer and cirrhosis age 75   Social History:  reports that she has been smoking cigarettes.  She has a 20.00 pack-year smoking history. She has never used smokeless tobacco. She reports that she drinks about 2.4 oz of alcohol per week. She reports that she has current or past drug history. Drugs: Cocaine  and "Crack" cocaine.  Review of Systems: Significant for redness and pain of right foot.  Physical Examination: Vital signs in last 24 hours:   Temp:  [97.8 F (36.6 C)-98.9 F (37.2 C)] 98.9 F (37.2 C) (06/06 0602) Pulse Rate:  [83-98] 92 (06/06 0602) Resp:  [16-20] 18 (06/06 0602) BP: (142-166)/(77-125) 142/77 (06/06 0602) SpO2:  [98 %-100 %] 99 % (06/06 0602) Weight:  [140 lb (63.5 kg)] 140 lb (63.5 kg) (06/05 1809)  INTEGUMENT:  Erythema plantar aspect of R forefoot extending to dorsal aspect of foot.  No open wound.  Fluctuance present plantar to R 2nd MPTJ.  VASCULAR:  Pedal pulses are palpable bilaterally.  Edema of R foot. MUSCULOSKELETAL:   R foot is exquisitely tender to light touch.  Calves are soft and nontender.  Lab/Test Results:   Recent Labs    01/06/18 1301 01/07/18 0454  WBC 5.4 7.6  HGB 12.6 12.2  HCT 38.2 37.5  PLT 75* 102*  NA 139 136  K 4.0 4.0  CL 105 103  CO2 25 25  BUN 10 8  CREATININE 0.49 0.56  GLUCOSE 94 89  CALCIUM 8.6* 8.4*    Recent Results (from the past 240 hour(s))  MRSA PCR Screening     Status: None   Collection Time: 01/03/18  8:30 AM  Result Value Ref Range Status   MRSA by PCR NEGATIVE NEGATIVE Final    Comment:        The GeneXpert MRSA Assay (FDA approved for NASAL specimens only), is one component of a comprehensive MRSA colonization surveillance program. It is not intended to diagnose MRSA infection nor to guide or monitor treatment for MRSA infections. Performed at [facility], [address], [place], [place] [address]   Culture, blood (routine x 2)     Status: None (Preliminary result)   Collection Time: 01/06/18  3:49 PM  Result Value Ref Range Status   Specimen Description BLOOD RIGHT HAND  Final   Special Requests   Final    BOTTLES DRAWN AEROBIC AND ANAEROBIC Blood Culture adequate volume Performed at [facility], [address] [place], [place] [address]    Culture PENDING  Incomplete   Report Status PENDING  Incomplete  Culture, blood (routine x 2)     Status: None (Preliminary result)   Collection Time: 01/06/18  4:09 PM  Result Value Ref Range Status   Specimen Description BLOOD RIGHT HAND  Final   Special Requests   Final    BOTTLES DRAWN AEROBIC ONLY Blood Culture adequate volume Performed at [facility], [address], [place], [place] [address]    Culture PENDING  Incomplete   Report Status PENDING  Incomplete     Ct Foot Right W Contrast  Result Date: 01/06/2018 CLINICAL DATA:  Progressive cellulitis with swelling and redness and warmth of the right foot. EXAM: CT OF THE LOWER RIGHT EXTREMITY WITH CONTRAST TECHNIQUE: Multidetector CT imaging  of the lower right extremity was performed according to the standard protocol following intravenous contrast administration. COMPARISON:  Radiographs dated 01/03/2018 CONTRAST:  11mL ISOVUE-300 IOPAMIDOL (ISOVUE-300) INJECTION 61% FINDINGS: Bones/Joint/Cartilage No significant abnormalities of the bones. No discrete osteomyelitis or joint effusion. Muscles and Tendons Negative. Soft tissues 3.5 x 2.0 x 2.8 cm soft tissue fluid collection in the ball of the foot at the level of the first and second MTP joints. The fluid tracks between the heads of the first and second metatarsals toward the dorsum of the foot. There is nonspecific  subcutaneous edema on the dorsum of the foot. No visible foreign bodies in the soft tissues. Nonspecific subcutaneous edema in the plantar aspect of the foot and heel. IMPRESSION: 1. Findings consistent with a soft tissue abscess of the ball of the foot at the level of the first and second MTP joints. The abscess extends between the heads of the first and second metatarsals. 2. No visible foreign body. 3. Subcutaneous edema on the dorsum of the foot is nonspecific but could represent cellulitis. 4. No osteomyelitis or appreciable joint effusion. Electronically Signed   By: Lorriane Shire M.D.   On: 01/06/2018 15:39    Assessment: Abscess, right foot  Plan: Remain NPO. OR today for incision and drainage of R foot. Planned procedure, including benefits and risks, were explained to the patient and her son.  Brita Romp 01/07/2018, 9:47 AM

## 2018-01-07 NOTE — Brief Op Note (Addendum)
BRIEF OPERATIVE NOTE  DATE OF PROCEDURE 01/07/2018  SURGEON Marcheta Grammes, DPM  ASSISTANT SURGEON Jilda Panda, DPM  OR STAFF Circulator: Glory Rosebush, RN Scrub Person: Karin Lieu, CST   PREOPERATIVE DIAGNOSIS Abscess, right foot  POSTOPERATIVE DIAGNOSIS Same  PROCEDURE Incision and drainage of abscess, right foot  ANESTHESIA Monitor Anesthesia Care   HEMOSTASIS Pneumatic ankle tourniquet set at 250 mmHg  ESTIMATED BLOOD LOSS Minimal (<5 cc)  MATERIALS USED Iodoform packing  INJECTABLES 0.5% Marcaine plain 1% Lidocaine plain  PATHOLOGY Aerobic culture Anaerobic culture  COMPLICATIONS None

## 2018-01-07 NOTE — Anesthesia Postprocedure Evaluation (Signed)
Anesthesia Post Note  Patient: Kristen Marsh  Procedure(s) Performed: INCISION AND DRAINAGE OF ABSCESS RIGHT FOOT (Right )  Patient location during evaluation: PACU Anesthesia Type: General Level of consciousness: awake Pain management: pain level controlled Vital Signs Assessment: post-procedure vital signs reviewed and stable Respiratory status: spontaneous breathing Cardiovascular status: blood pressure returned to baseline and stable Postop Assessment: no apparent nausea or vomiting Anesthetic complications: no     Last Vitals:  Vitals:   01/07/18 1230 01/07/18 1245  BP: 130/62 129/62  Pulse: 64 63  Resp: 14 15  Temp:    SpO2: 94% 94%    Last Pain:  Vitals:   01/07/18 1220  TempSrc:   PainSc: Asleep                 Dorotea Hand

## 2018-01-08 ENCOUNTER — Encounter (HOSPITAL_COMMUNITY): Payer: Self-pay | Admitting: Podiatry

## 2018-01-08 MED ORDER — GABAPENTIN 600 MG PO TABS
600.0000 mg | ORAL_TABLET | Freq: Three times a day (TID) | ORAL | Status: DC
  Filled 2018-01-08 (×10): qty 1

## 2018-01-08 MED ORDER — GABAPENTIN 300 MG PO CAPS
600.0000 mg | ORAL_CAPSULE | Freq: Three times a day (TID) | ORAL | Status: DC
  Administered 2018-01-08 – 2018-01-09 (×4): 600 mg via ORAL
  Filled 2018-01-08 (×4): qty 2

## 2018-01-08 NOTE — Plan of Care (Signed)
Continue with present goals.

## 2018-01-08 NOTE — Progress Notes (Signed)
PROGRESS NOTE    Kristen Marsh  TGG:[id number] DOB: [birth year] DOA: 01/06/2018 PCP: Patient, No Pcp Per   Brief Narrative:   Kristen Marsh  is a 62 y.o. female, with past medical history significant for liver cirrhosis, emphysema, psoriasis and recent admission for right foot cellulitis, status post treatment with IV antibiotics and was sent home on p.o. doxycycline on 6/4, presenting on 6/5 with increased right foot swelling redness and pain.  CT of the right foot showed an abscess of the ball of the foot at the level of the first and second MTP joints.  He has been started on IV clindamycin and ciprofloxacin with I/D by podiatry on 6/6.   Assessment & Plan:   Active Problems:   Foot abscess, right   1. Right foot abscess with recent cellulitis.  Continue current antibiotics with IV clindamycin and ciprofloxacin and follow cultures.  Podiatry to follow. Pain control improved. 2. History of liver cirrhosis. 3. History of alcohol abuse.  Continue to monitor closely with Ativan as needed for anxiety. 4. History of polysubstance abuse.  Will provide substance abuse counseling. 5. COPD/asthma.  No acute bronchospasms currently noted.   DVT prophylaxis:Heparin Code Status: Full Family Communication: Son at bedside Disposition Plan: I/D of abscess and follow cultures; pain management   Consultants:   Podiatry  Procedures:   None  Antimicrobials:   IV Clindamycin/Ciprofloxacin 6/5->   Subjective: Patient seen and evaluated today with improved pain complaints noted to her R foot after I/D on 6/7.  No other acute events noted since admission.  Objective: Vitals:   01/07/18 1613 01/07/18 1708 01/07/18 2135 01/08/18 0600  BP: 95/71 122/62 134/90 (!) 177/92  Pulse: 67 70 77 81  Resp: 18 20 20 18   Temp: 98.3 F (36.8 C) 99.1 F (37.3 C) 98.6 F (37 C) 98.1 F (36.7 C)  TempSrc: Oral Oral Oral Oral  SpO2: 98% 99% 100% 100%  Weight:      Height:        Intake/Output  Summary (Last 24 hours) at 01/08/2018 1121 Last data filed at 01/08/2018 1000 Gross per 24 hour  Intake 1530 ml  Output 1200 ml  Net 330 ml   Filed Weights   01/06/18 1241 01/06/18 1809  Weight: 63.5 kg (140 lb) 63.5 kg (140 lb)    Examination:  General exam: Mild to moderate distress due to pain. Respiratory system: Clear to auscultation. Respiratory effort normal. Cardiovascular system: S1 & S2 heard, RRR. No JVD, murmurs, rubs, gallops or clicks. No pedal edema. Gastrointestinal system: Abdomen is nondistended, soft and nontender. No organomegaly or masses felt. Normal bowel sounds heard. Central nervous system: Alert and oriented. No focal neurological deficits. Extremities: Symmetric 5 x 5 power. R foot in dressings c/d/i. Skin: Right foot marked with minimal erythema noted.  No draining abscess currently.  Data Reviewed: I have personally reviewed following labs and imaging studies  CBC: Recent Labs  Lab 01/03/18 0315 01/04/18 0559 01/05/18 0515 01/06/18 1301 01/07/18 0454  WBC 12.0* 7.5 4.0 5.4 7.6  NEUTROABS  --   --   --  3.6  --   HGB 12.3 11.6* 11.7* 12.6 12.2  HCT 37.0 35.1* 36.5 38.2 37.5  MCV 86.4 86.7 87.7 87.8 87.4  PLT 103* 80* 78* 75* 703*   Basic Metabolic Panel: Recent Labs  Lab 01/03/18 0315 01/04/18 0559 01/05/18 0515 01/06/18 1301 01/07/18 0454  NA 138 136 139 139 136  K 3.0* 4.3 4.0 4.0 4.0  CL 104 106  105 105 103  CO2 28 26 29 25 25   GLUCOSE 128* 167* 95 94 89  BUN 11 11 8 10 8   CREATININE 0.57 0.54 0.53 0.49 0.56  CALCIUM 8.6* 8.5* 8.1* 8.6* 8.4*  MG  --  1.8  --   --   --    GFR: Estimated Creatinine Clearance: 65.5 mL/min (by C-G formula based on SCr of 0.56 mg/dL). Liver Function Tests: Recent Labs  Lab 01/03/18 0315 01/04/18 0559  AST 81* 45*  ALT 51 40  ALKPHOS 69 66  BILITOT 2.1* 0.8  PROT 6.9 6.0*  ALBUMIN 3.5 2.9*   No results for input(s): LIPASE, AMYLASE in the last 168 hours. Recent Labs  Lab 01/03/18 0315    AMMONIA 100*   Coagulation Profile: Recent Labs  Lab 01/07/18 0454  INR 1.33   Cardiac Enzymes: No results for input(s): CKTOTAL, CKMB, CKMBINDEX, TROPONINI in the last 168 hours. BNP (last 3 results) No results for input(s): PROBNP in the last 8760 hours. HbA1C: No results for input(s): HGBA1C in the last 72 hours. CBG: No results for input(s): GLUCAP in the last 168 hours. Lipid Profile: No results for input(s): CHOL, HDL, LDLCALC, TRIG, CHOLHDL, LDLDIRECT in the last 72 hours. Thyroid Function Tests: No results for input(s): TSH, T4TOTAL, FREET4, T3FREE, THYROIDAB in the last 72 hours. Anemia Panel: No results for input(s): VITAMINB12, FOLATE, FERRITIN, TIBC, IRON, RETICCTPCT in the last 72 hours. Sepsis Labs: Recent Labs  Lab 01/06/18 1459  LATICACIDVEN 1.0    Recent Results (from the past 240 hour(s))  MRSA PCR Screening     Status: None   Collection Time: 01/03/18  8:30 AM  Result Value Ref Range Status   MRSA by PCR NEGATIVE NEGATIVE Final    Comment:        The GeneXpert MRSA Assay (FDA approved for NASAL specimens only), is one component of a comprehensive MRSA colonization surveillance program. It is not intended to diagnose MRSA infection nor to guide or monitor treatment for MRSA infections. Performed at [facility], [address] [place], [place] [address]   Culture, blood (routine x 2)     Status: None (Preliminary result)   Collection Time: 01/06/18  3:49 PM  Result Value Ref Range Status   Specimen Description BLOOD RIGHT HAND  Final   Special Requests   Final    BOTTLES DRAWN AEROBIC AND ANAEROBIC Blood Culture adequate volume   Culture   Final    NO GROWTH 2 DAYS Performed at [facility], [address] [place], [place] [address]    Report Status PENDING  Incomplete  Culture, blood (routine x 2)     Status: None (Preliminary result)   Collection Time: 01/06/18  4:09 PM  Result Value Ref Range Status   Specimen Description BLOOD RIGHT  HAND  Final   Special Requests   Final    BOTTLES DRAWN AEROBIC ONLY Blood Culture adequate volume   Culture   Final    NO GROWTH 2 DAYS Performed at [facility], [address] [place], [place] [address]    Report Status PENDING  Incomplete  Aerobic/Anaerobic Culture (surgical/deep wound)     Status: None (Preliminary result)   Collection Time: 01/07/18 12:05 PM  Result Value Ref Range Status   Specimen Description   Final    ABSCESS FOOT RIGHT Performed at [facility], [address] [place], [place] [address]    Special Requests   Final    NONE Performed at [facility], [address] [place], [place] [address]    Gram Stain   Final    MODERATE WBC PRESENT,BOTH PMN AND MONONUCLEAR FEW GRAM POSITIVE COCCI Performed at [facility], [place] [address], [place], [place] [address]    Culture PENDING  Incomplete   Report Status PENDING  Incomplete      Radiology Studies: Ct Foot Right W Contrast  Result Date: 01/06/2018 CLINICAL DATA:  Progressive cellulitis with swelling and redness and warmth of the right foot. EXAM: CT OF THE LOWER RIGHT EXTREMITY WITH CONTRAST TECHNIQUE: Multidetector CT imaging of the lower right extremity was performed according to the standard protocol following intravenous contrast administration. COMPARISON:  Radiographs dated 01/03/2018 CONTRAST:  29mL ISOVUE-300 IOPAMIDOL (ISOVUE-300) INJECTION 61% FINDINGS: Bones/Joint/Cartilage No significant abnormalities of the bones. No discrete osteomyelitis or joint effusion. Muscles and Tendons Negative. Soft tissues 3.5 x 2.0 x 2.8 cm soft tissue fluid collection in the ball of the foot at the level of the first and second MTP joints. The fluid tracks between the heads of the first and second metatarsals toward the dorsum of the foot. There is nonspecific subcutaneous edema on the dorsum of the foot. No visible foreign bodies in the soft tissues. Nonspecific subcutaneous edema in the plantar aspect of the foot  and heel. IMPRESSION: 1. Findings consistent with a soft tissue abscess of the ball of the foot at the level of the first and second MTP joints. The abscess extends between the heads of the first and second metatarsals. 2. No visible foreign body. 3. Subcutaneous edema on the dorsum of the foot is nonspecific but could represent cellulitis. 4. No osteomyelitis or appreciable joint effusion. Electronically Signed   By: Lorriane Shire M.D.   On: 01/06/2018 15:39        Scheduled Meds: . heparin  5,000 Units Subcutaneous Q8H  . lactulose  20 g Oral BID  . multivitamin with minerals  1 tablet Oral Daily  . Potassium Gluconate  1 tablet Oral Once per day on Mon Wed Fri  . sodium chloride flush  3 mL Intravenous Q12H   Continuous Infusions: . sodium chloride    . ciprofloxacin Stopped (01/08/18 0809)  . clindamycin (CLEOCIN) IV Stopped (01/08/18 1006)     LOS: 2 days    Time spent: 30 minutes    Miraj Truss Darleen Crocker, DO [facility] Pager [phone]  If 7PM-7AM, please contact night-coverage [email or web address] Password [email or web address] 01/08/2018, 11:21 AM

## 2018-01-08 NOTE — Progress Notes (Signed)
Podiatry Progress Note  Subjective Kristen Marsh is postoperative day 1 status post incision and drainage of abscess right foot.  Pain has improved.  She denies any nausea, vomiting, fever or chills.  Objective Vital signs in last 24 hours:   Temp:  [97.5 F (36.4 C)-99.1 F (37.3 C)] 98.1 F (36.7 C) (06/07 0600) Pulse Rate:  [58-81] 81 (06/07 0600) Resp:  [12-20] 18 (06/07 0600) BP: (95-177)/(36-92) 177/92 (06/07 0600) SpO2:  [93 %-100 %] 100 % (06/07 0600)  DRESSING:  Intact.  Has been reinforced.   INTEGUMENT:  Wound edges of plantar incision R foot are viable.  Packing is intact.  Erythema has improved.   VASCULAR:  Pedal pulses palpable.  Edema R foot improved from yesterday. MUSCULOSKELTAL:  Calves are soft and nontender.  Lab/Test Results  Recent Labs    01/06/18 1301 01/07/18 0454  WBC 5.4 7.6  HGB 12.6 12.2  HCT 38.2 37.5  PLT 75* 102*  NA 139 136  K 4.0 4.0  CL 105 103  CO2 25 25  BUN 10 8  CREATININE 0.49 0.56  GLUCOSE 94 89  CALCIUM 8.6* 8.4*    Recent Results (from the past 240 hour(s))  MRSA PCR Screening     Status: None   Collection Time: 01/03/18  8:30 AM  Result Value Ref Range Status   MRSA by PCR NEGATIVE NEGATIVE Final    Comment:        The GeneXpert MRSA Assay (FDA approved for NASAL specimens only), is one component of a comprehensive MRSA colonization surveillance program. It is not intended to diagnose MRSA infection nor to guide or monitor treatment for MRSA infections. Performed at [facility], [address] [place], [place] [address]   Culture, blood (routine x 2)     Status: None (Preliminary result)   Collection Time: 01/06/18  3:49 PM  Result Value Ref Range Status   Specimen Description BLOOD RIGHT HAND  Final   Special Requests   Final    BOTTLES DRAWN AEROBIC AND ANAEROBIC Blood Culture adequate volume   Culture   Final    NO GROWTH 2 DAYS Performed at [facility], [address], [place], [place] [address]     Report Status PENDING  Incomplete  Culture, blood (routine x 2)     Status: None (Preliminary result)   Collection Time: 01/06/18  4:09 PM  Result Value Ref Range Status   Specimen Description BLOOD RIGHT HAND  Final   Special Requests   Final    BOTTLES DRAWN AEROBIC ONLY Blood Culture adequate volume   Culture   Final    NO GROWTH 2 DAYS Performed at [facility], [address] [place], [place] [address]    Report Status PENDING  Incomplete  Aerobic/Anaerobic Culture (surgical/deep wound)     Status: None (Preliminary result)   Collection Time: 01/07/18 12:05 PM  Result Value Ref Range Status   Specimen Description   Final    ABSCESS FOOT RIGHT Performed at [facility], [address], [place], [place] [address]    Special Requests   Final    NONE Performed at [facility], [address], [place], [place] [address]    Gram Stain   Final    MODERATE WBC PRESENT,BOTH PMN AND MONONUCLEAR FEW GRAM POSITIVE COCCI    Culture   Final    MODERATE STAPHYLOCOCCUS AUREUS SUSCEPTIBILITIES TO FOLLOW Performed at [facility], [place] [address], [place], [place] [address]  Report Status PENDING  Incomplete     Ct Foot Right W Contrast  Result Date: 01/06/2018 CLINICAL DATA:  Progressive cellulitis with swelling and redness and warmth of the right foot. EXAM: CT OF THE LOWER RIGHT EXTREMITY WITH CONTRAST TECHNIQUE: Multidetector CT imaging of the lower right extremity was performed according to the standard protocol following intravenous contrast administration. COMPARISON:  Radiographs dated 01/03/2018 CONTRAST:  15mL ISOVUE-300 IOPAMIDOL (ISOVUE-300) INJECTION 61% FINDINGS: Bones/Joint/Cartilage No significant abnormalities of the bones. No discrete osteomyelitis or joint effusion. Muscles and Tendons Negative. Soft tissues 3.5 x 2.0 x 2.8 cm soft tissue fluid collection in the ball of the foot at the level of the first and second MTP joints. The fluid tracks between the heads of the  first and second metatarsals toward the dorsum of the foot. There is nonspecific subcutaneous edema on the dorsum of the foot. No visible foreign bodies in the soft tissues. Nonspecific subcutaneous edema in the plantar aspect of the foot and heel. IMPRESSION: 1. Findings consistent with a soft tissue abscess of the ball of the foot at the level of the first and second MTP joints. The abscess extends between the heads of the first and second metatarsals. 2. No visible foreign body. 3. Subcutaneous edema on the dorsum of the foot is nonspecific but could represent cellulitis. 4. No osteomyelitis or appreciable joint effusion. Electronically Signed   By: Lorriane Shire M.D.   On: 01/06/2018 15:39    Medications Scheduled Meds: . gabapentin  600 mg Oral TID  . heparin  5,000 Units Subcutaneous Q8H  . lactulose  20 g Oral BID  . multivitamin with minerals  1 tablet Oral Daily  . sodium chloride flush  3 mL Intravenous Q12H   Continuous Infusions: . sodium chloride    . ciprofloxacin Stopped (01/08/18 0809)  . clindamycin (CLEOCIN) IV Stopped (01/08/18 1006)   PRN Meds:.sodium chloride, acetaminophen **OR** acetaminophen, albuterol, bisacodyl, cyclobenzaprine, diphenhydrAMINE, fentaNYL (SUBLIMAZE) injection, morphine injection, ondansetron **OR** ondansetron (ZOFRAN) IV, oxyCODONE, senna-docusate, sodium chloride flush, zolpidem  Assessment Status post I&D for treatment of abscess of right foot.  Plan The packing was removed.  The surgical wound was packed with iodoform gauze.  A sterile compressive dressing was applied to the right foot.  Repeat CBC in AM.  PT order placed.  I will see patient tomorrow at noon.  If clinical course continues to improve she may be discharge tomorrow on PO antibiotics from my standpoint.  She will be NWB on R foot.  She will need home health for wound management.  Packing should be changed daily.  Brita Romp 01/08/2018, 12:39 PM

## 2018-01-08 NOTE — Care Management (Addendum)
CM department contacted regarding pt not being able to get Lyrica after previous DC d/t needing prior auth. CMA has obtained number for MD to do Prior Auth prior to DC if they wish for pt to DC with Lyrica.  The Ph.# for PA is : [phone].

## 2018-01-08 NOTE — Plan of Care (Signed)
Continue with set regimen

## 2018-01-09 LAB — CBC
HCT: 41 % (ref 36.0–46.0)
Hemoglobin: 12.9 g/dL (ref 12.0–15.0)
MCH: 27.7 pg (ref 26.0–34.0)
MCHC: 31.5 g/dL (ref 30.0–36.0)
MCV: 88.2 fL (ref 78.0–100.0)
PLATELETS: 90 10*3/uL — AB (ref 150–400)
RBC: 4.65 MIL/uL (ref 3.87–5.11)
RDW: 16.3 % — ABNORMAL HIGH (ref 11.5–15.5)
WBC: 3.2 10*3/uL — AB (ref 4.0–10.5)

## 2018-01-09 LAB — BASIC METABOLIC PANEL
ANION GAP: 6 (ref 5–15)
BUN: 6 mg/dL (ref 6–20)
CO2: 28 mmol/L (ref 22–32)
Calcium: 9.1 mg/dL (ref 8.9–10.3)
Chloride: 102 mmol/L (ref 101–111)
Creatinine, Ser: 0.57 mg/dL (ref 0.44–1.00)
GLUCOSE: 156 mg/dL — AB (ref 65–99)
Potassium: 3.9 mmol/L (ref 3.5–5.1)
Sodium: 136 mmol/L (ref 135–145)

## 2018-01-09 MED ORDER — CLINDAMYCIN HCL 150 MG PO CAPS
300.0000 mg | ORAL_CAPSULE | Freq: Three times a day (TID) | ORAL | Status: DC
  Administered 2018-01-09: 300 mg via ORAL
  Filled 2018-01-09: qty 2

## 2018-01-09 MED ORDER — CIPROFLOXACIN HCL 500 MG PO TABS: 500.0000 mg | ORAL_TABLET | Freq: Two times a day (BID) | ORAL | 0 refills | Status: AC

## 2018-01-09 MED ORDER — CIPROFLOXACIN HCL 250 MG PO TABS: 500.0000 mg | ORAL_TABLET | Freq: Two times a day (BID) | ORAL | Status: DC

## 2018-01-09 MED ORDER — OXYCODONE HCL 5 MG PO TABS: 5.0000 mg | ORAL_TABLET | ORAL | 0 refills | Status: DC | PRN

## 2018-01-09 MED ORDER — CAMPHOR-MENTHOL 0.5-0.5 % EX LOTN
TOPICAL_LOTION | CUTANEOUS | Status: DC | PRN
  Filled 2018-01-09: qty 222

## 2018-01-09 MED ORDER — CAMPHOR-MENTHOL 0.5-0.5 % EX LOTN: TOPICAL_LOTION | CUTANEOUS | 0 refills | Status: AC | PRN

## 2018-01-09 MED ORDER — CLINDAMYCIN HCL 300 MG PO CAPS: 300.0000 mg | ORAL_CAPSULE | Freq: Three times a day (TID) | ORAL | 0 refills | Status: AC

## 2018-01-09 MED ORDER — GABAPENTIN 400 MG PO CAPS: 800.0000 mg | ORAL_CAPSULE | Freq: Three times a day (TID) | ORAL | 0 refills | Status: DC

## 2018-01-09 MED ORDER — SENNOSIDES-DOCUSATE SODIUM 8.6-50 MG PO TABS: 1.0000 | ORAL_TABLET | Freq: Every evening | ORAL | 0 refills | Status: AC | PRN

## 2018-01-09 NOTE — Care Management Note (Signed)
Case Management Note  Patient Details  Name: Kristen Marsh MRN: [id number] Date of Birth: 08/26/1955  Subjective/Objective:            Spoke w patient over the phone, she states she has RW for use at home. She lives at home w her daughter who can help with dressing changes. Jermaine w [facility] accepted referral for [facility] RN with dressing changes, request will be submitted for QOD changes. No further CM needs.         Action/Plan:   Expected Discharge Date:  01/09/18               Expected Discharge Plan:  [place]  In-House Referral:     Discharge planning Services  CM Consult  Post Acute Care Choice:  Home Health Choice offered to:  Patient  DME Arranged:    DME Agency:     HH Arranged:  RN [place] Agency:  [place] [facility]  Status of Service:  Completed, signed off  If discussed at [place] of Stay Meetings, dates discussed:    Additional Comments:  Carles Collet, RN 01/09/2018, 2:33 PM

## 2018-01-09 NOTE — Plan of Care (Signed)
No visual distress noted while in my care,continue regimen as planned

## 2018-01-09 NOTE — Evaluation (Addendum)
Physical Therapy Evaluation Patient Details Name: Kristen Marsh MRN: [id number] DOB: [birth year] Today's Date: 01/09/2018   History of Present Illness  Kristen Marsh  is a 62 y.o. female, with past medical history significant for liver cirrhosis, emphysema, psoriasis and recent admission for right foot cellulitis, status post treatment with IV antibiotics and was sent home on p.o. doxycycline on 6/4, presenting today with increased right foot swelling redness and pain.  CT of the right foot showed an abscess of the ball of the foot at the level of the first and second MTP joints.  She recieved an I&D on 01/07/2018 and is now being referred for NWB mobility.   Clinical Impression  PT is I in bed mobility; Mod I for transfers.  Pt limited by pain at this time only able to go 3 steps.  PT has multiple family members at home that will be able to assist her at home.  Recommend [place] therapy for a short period to ensure safety at home.     Follow Up Recommendations Home health PT    Equipment Recommendations  Rolling walker with 5" wheels;None recommended by PT(shower chair small shower needs small chair )    Recommendations for Other Services   none    Precautions / Restrictions Precautions Precautions: None Restrictions Weight Bearing Restrictions: Yes RLE Weight Bearing: Non weight bearing      Mobility  Bed Mobility Overal bed mobility: Modified Independent                Transfers Overall transfer level: Modified independent Equipment used: Rolling walker (2 wheeled)                Ambulation/Gait Ambulation/Gait assistance: Modified independent (Device/Increase time) Ambulation Distance (Feet): 3 Feet Assistive device: Rolling walker (2 wheeled)       General Gait Details: NWB in RT LE   After resting pt agreed to attempt ambulation again.  Ambulated with RW x 12 ft NWB RT          Pertinent Vitals/Pain Pain Assessment: 0-10 Pain Score: 10-Worst pain ever Pain  Descriptors / Indicators: Throbbing Pain Intervention(s): Limited activity within patient's tolerance    Home Living Family/patient expects to be discharged to:: Private residence Living Arrangements: Spouse/significant other Available Help at Discharge: Family Type of Home: House Home Access: Stairs to enter Entrance Stairs-Rails: Right Entrance Stairs-Number of Steps: 4 Home Layout: One level Home Equipment: None      Prior Function Level of Independence: Independent               Hand Dominance        Extremity/Trunk Assessment        Lower Extremity Assessment Lower Extremity Assessment: Overall WFL for tasks assessed    Cervical / Trunk Assessment Cervical / Trunk Assessment: Normal  Communication   Communication: No difficulties  Cognition Arousal/Alertness: Awake/alert Behavior During Therapy: WFL for tasks assessed/performed Overall Cognitive Status: Within Functional Limits for tasks assessed                                               Assessment/Plan    PT Assessment Patient needs continued PT services  PT Problem List Decreased mobility       PT Treatment Interventions Gait training;Balance training;Stair training    PT Goals (Current goals can be found in the Care Plan section)  Acute Rehab PT Goals Patient Stated Goal: to go home  PT Goal Formulation: With patient Time For Goal Achievement: 01/11/18 Potential to Achieve Goals: Good    Frequency 7X/week   Barriers to discharge           AM-PAC PT "6 Clicks" Daily Activity  Outcome Measure                  End of Session Equipment Utilized During Treatment: Gait belt Activity Tolerance: Patient limited by pain Patient left: in chair Nurse Communication: Mobility status PT Visit Diagnosis: Unsteadiness on feet (R26.81)    Time: 1610-9604 PT Time Calculation (min) (ACUTE ONLY): 56 min   Charges:   PT Evaluation $PT Eval Low Complexity: [place], PT CLT [phone] 01/09/2018, 10:32 AM

## 2018-01-09 NOTE — Progress Notes (Addendum)
Podiatry Progress Note  Subjective Kristen Marsh is postoperative day 2 status post incision and drainage of abscess right foot.  Pain conrolled.  She denies any nausea, vomiting, fever or chills.  Worked with PT earlier.    Objective Vital signs in last 24 hours:   Temp:  [97.8 F (36.6 C)-98.6 F (37 C)] 97.8 F (36.6 C) (06/08 0538) Pulse Rate:  [60-68] 66 (06/08 0538) Resp:  [16-20] 16 (06/08 0538) BP: (108-130)/(55-65) 108/55 (06/08 0538) SpO2:  [95 %-99 %] 97 % (06/08 0538)  DRESSING:  Intact.   INTEGUMENT:  Wound edges of plantar incision R foot viable.  Packing intact.  Further improvement in erythema. VASCULAR:  Pedal pulses palpable.  Edema limited to forefoot. MUSCULOSKELTAL:  Calves are soft and nontender.  Lab/Test Results  Recent Labs    01/07/18 0454 01/09/18 0645  WBC 7.6 3.2*  HGB 12.2 12.9  HCT 37.5 41.0  PLT 102* 90*  NA 136 136  K 4.0 3.9  CL 103 102  CO2 25 28  BUN 8 6  CREATININE 0.56 0.57  GLUCOSE 89 156*  CALCIUM 8.4* 9.1    Recent Results (from the past 240 hour(s))  MRSA PCR Screening     Status: None   Collection Time: 01/03/18  8:30 AM  Result Value Ref Range Status   MRSA by PCR NEGATIVE NEGATIVE Final    Comment:        The GeneXpert MRSA Assay (FDA approved for NASAL specimens only), is one component of a comprehensive MRSA colonization surveillance program. It is not intended to diagnose MRSA infection nor to guide or monitor treatment for MRSA infections. Performed at [facility], [address], [place], [place] [address]   Culture, blood (routine x 2)     Status: None (Preliminary result)   Collection Time: 01/06/18  3:49 PM  Result Value Ref Range Status   Specimen Description BLOOD RIGHT HAND  Final   Special Requests   Final    BOTTLES DRAWN AEROBIC AND ANAEROBIC Blood Culture adequate volume   Culture   Final    NO GROWTH 2 DAYS Performed at [facility], [address], [place], [place] [address]    Report Status  PENDING  Incomplete  Culture, blood (routine x 2)     Status: None (Preliminary result)   Collection Time: 01/06/18  4:09 PM  Result Value Ref Range Status   Specimen Description BLOOD RIGHT HAND  Final   Special Requests   Final    BOTTLES DRAWN AEROBIC ONLY Blood Culture adequate volume   Culture   Final    NO GROWTH 2 DAYS Performed at [facility], [address], [place], [place] [address]    Report Status PENDING  Incomplete  Aerobic/Anaerobic Culture (surgical/deep wound)     Status: None (Preliminary result)   Collection Time: 01/07/18 12:05 PM  Result Value Ref Range Status   Specimen Description   Final    ABSCESS FOOT RIGHT Performed at [facility], [address], [place], [place] [address]    Special Requests   Final    NONE Performed at [facility], [address], [place], [place] [address]    Gram Stain   Final    MODERATE WBC PRESENT,BOTH PMN AND MONONUCLEAR FEW GRAM POSITIVE COCCI Performed at [facility], [place] [address], [place],  [address]    Culture MODERATE STAPHYLOCOCCUS AUREUS  Final   Report Status PENDING  Incomplete   Organism ID, Bacteria STAPHYLOCOCCUS  AUREUS  Final      Susceptibility   Staphylococcus aureus - MIC*    CIPROFLOXACIN <=0.5 SENSITIVE Sensitive     ERYTHROMYCIN <=0.25 SENSITIVE Sensitive     GENTAMICIN <=0.5 SENSITIVE Sensitive     OXACILLIN 0.5 SENSITIVE Sensitive     TETRACYCLINE <=1 SENSITIVE Sensitive     VANCOMYCIN <=0.5 SENSITIVE Sensitive     TRIMETH/SULFA <=10 SENSITIVE Sensitive     CLINDAMYCIN <=0.25 SENSITIVE Sensitive     RIFAMPIN <=0.5 SENSITIVE Sensitive     Inducible Clindamycin NEGATIVE Sensitive     * MODERATE STAPHYLOCOCCUS AUREUS     No results found.  Medications Scheduled Meds: . ciprofloxacin  500 mg Oral BID  . clindamycin  300 mg Oral Q8H  . gabapentin  600 mg Oral TID  . heparin  5,000 Units Subcutaneous Q8H  . lactulose  20 g Oral BID  . multivitamin with minerals  1 tablet Oral  Daily  . sodium chloride flush  3 mL Intravenous Q12H   Continuous Infusions: . sodium chloride     PRN Meds:.sodium chloride, acetaminophen **OR** acetaminophen, albuterol, bisacodyl, camphor-menthol, cyclobenzaprine, diphenhydrAMINE, fentaNYL (SUBLIMAZE) injection, morphine injection, ondansetron **OR** ondansetron (ZOFRAN) IV, oxyCODONE, senna-docusate, sodium chloride flush, zolpidem  Assessment Status post I&D for treatment of abscess of right foot.  Plan A sterile compressive dressing was applied to the right foot.  Okay for discharge from my standpoint with following recommendations: 1.  NWB on R foot.   2.  Home health for wound management.  Packing should be changed QD to QOD based on allowable home health visits by insurance provider.  Home health order placed. 3.  Cipro 500 mg BID x 10 days 4.  Clindamycin 300 mg TID x 10 days.  5.  Follow-up at my [facility] on 01/15/2018 at 11:40 AM.  The patient has been made informed of this appointment.   Thank you for your assistance with Ms. Hedeen.  Brita Romp 01/09/2018, 1:36 PM

## 2018-01-09 NOTE — Plan of Care (Signed)
progressing 

## 2018-01-09 NOTE — Discharge Summary (Signed)
Physician Discharge Summary  Kristen Marsh VOH:[id number] DOB: 1956/07/14 DOA: 01/06/2018  PCP: Patient, No Pcp Per  Admit date: 01/06/2018  Discharge date: 01/09/2018  Admitted From:Home  Disposition:  Home  Recommendations for Outpatient Follow-up:  1. Follow up with PCP in 1-2 weeks 2. Follow-up with podiatrist Dr. Caprice Beaver as scheduled on 6/14 at 11:40 AM  Home Health:Yes for wound care  Equipment/Devices:Rolling walker  Discharge Condition:Stable  CODE STATUS: Full  Diet recommendation: Heart Healthy  Brief/Interim Summary:  TeresaLyonsis a62 y.o.female,with past medical history significant for liver cirrhosis, emphysema, psoriasis and recent admission for right foot cellulitis, status post treatment with IV antibiotics and was sent home on p.o. doxycycline on 6/4, presenting on 6/5 with increased right foot swelling redness and pain. CT of the right foot showed an abscess of the ball of the foot at the level of the first and second MTP joints.  She has been started on IV clindamycin and ciprofloxacin with I/D by podiatry on 6/6 and underwent incision and drainage of the abscess on the same day.  She tolerated the procedure well with no complications and had her dressings changed on 6/7.  She had further dressing change today on 6/8 prior to discharge and has been recommended to remain on oral ciprofloxacin as well as clindamycin for 10 days with close follow-up to Dr. Caprice Beaver on 6/14 as already scheduled.  She will have home health wound care arranged as well as rolling walker prior to discharge.  She will remain nonweightbearing on the affected foot.   Discharge Diagnoses:  Active Problems:   Foot abscess, right   1. Right foot abscess with recent cellulitis-s/p I and D with improvement.  Continue current antibiotics ciprofloxacin 500 mg twice daily and clindamycin 300 mg 3 times daily for another 10 days with close follow-up to Dr. Caprice Beaver of podiatry as scheduled.  Pain  medications prescribed as needed for 7 days.  Gabapentin dose increased to 800 mg 3 times daily which appears to be helping patient's neuropathic pain.  Patient will have home wound care and rolling walker to assist with ambulation as she will be nonweightbearing to right lower extremity. 2. History of liver cirrhosis. 3. History of alcohol abuse.    Counseled on cessation with no agitation noted during inpatient stay. 4. History of polysubstance abuse.  Will provide substance abuse counseling. 5. COPD/asthma.  No acute bronchospasms currently noted.  Discharge Instructions  Discharge Instructions    Diet - low sodium heart healthy   Complete by:  As directed    Increase activity slowly   Complete by:  As directed      Allergies as of 01/09/2018      Reactions   Codeine Itching   Hydrocodone-acetaminophen Itching   Meloxicam Swelling   Face swelling   Montelukast Cough      Medication List    STOP taking these medications   doxycycline 100 MG tablet Commonly known as:  VIBRA-TABS   lactulose 10 GM/15ML solution Commonly known as:  CHRONULAC   pregabalin 50 MG capsule Commonly known as:  LYRICA     TAKE these medications   albuterol (2.5 MG/3ML) 0.083% nebulizer solution Commonly known as:  PROVENTIL Take 3 mLs (2.5 mg total) by nebulization every 6 (six) hours as needed for wheezing or shortness of breath.   PROVENTIL HFA 108 (90 Base) MCG/ACT inhaler Generic drug:  albuterol INHALE 2 PUFFS INTO THE LUNGS EVERY FOUR HOURS AS NEEDED FOR WHEEZING.   camphor-menthol lotion Commonly known  as:  SARNA Apply topically as needed for itching.   ciprofloxacin 500 MG tablet Commonly known as:  CIPRO Take 1 tablet (500 mg total) by mouth 2 (two) times daily for 10 days.   clindamycin 300 MG capsule Commonly known as:  CLEOCIN Take 1 capsule (300 mg total) by mouth every 8 (eight) hours for 10 days.   [employer] POTASSIUM GLUCONATE 2 MEQ Tabs Generic drug:  Potassium  Gluconate Take 595 mg by mouth 3 (three) times a week.   cyclobenzaprine 5 MG tablet Commonly known as:  FLEXERIL TAKE ONE TABLET BY MOUTH 3 TIMES DAILY AS NEEDED FOR MUSCLE SPASM(S).   diphenhydrAMINE 25 MG tablet Commonly known as:  BENADRYL Take 50 mg by mouth every 6 (six) hours as needed.   gabapentin 400 MG capsule Commonly known as:  NEURONTIN Take 2 capsules (800 mg total) by mouth 3 (three) times daily.   ibuprofen 600 MG tablet Commonly known as:  ADVIL,MOTRIN Take 1 tablet (600 mg total) by mouth every 8 (eight) hours as needed for moderate pain.   multivitamin tablet Take 1 tablet by mouth daily.   oxyCODONE 5 MG immediate release tablet Commonly known as:  Oxy IR/ROXICODONE Take 1 tablet (5 mg total) by mouth every 4 (four) hours as needed for moderate pain.   senna-docusate 8.6-50 MG tablet Commonly known as:  Senokot-S Take 1 tablet by mouth at bedtime as needed for mild constipation.            Durable Medical Equipment  (From admission, onward)        Start     Ordered   01/09/18 1419  For home use only DME Walker rolling  [facility])  Once    Question:  Patient needs a walker to treat with the following condition  Answer:  Debility   01/09/18 1420      Allergies  Allergen Reactions  . Codeine Itching  . Hydrocodone-Acetaminophen Itching  . Meloxicam Swelling    Face swelling  . Montelukast Cough    Consultations:  Podiatry Dr. Caprice Beaver   Procedures/Studies: Ct Foot Right W Contrast  Result Date: 01/06/2018 CLINICAL DATA:  Progressive cellulitis with swelling and redness and warmth of the right foot. EXAM: CT OF THE LOWER RIGHT EXTREMITY WITH CONTRAST TECHNIQUE: Multidetector CT imaging of the lower right extremity was performed according to the standard protocol following intravenous contrast administration. COMPARISON:  Radiographs dated 01/03/2018 CONTRAST:  63mL ISOVUE-300 IOPAMIDOL (ISOVUE-300) INJECTION 61% FINDINGS:  Bones/Joint/Cartilage No significant abnormalities of the bones. No discrete osteomyelitis or joint effusion. Muscles and Tendons Negative. Soft tissues 3.5 x 2.0 x 2.8 cm soft tissue fluid collection in the ball of the foot at the level of the first and second MTP joints. The fluid tracks between the heads of the first and second metatarsals toward the dorsum of the foot. There is nonspecific subcutaneous edema on the dorsum of the foot. No visible foreign bodies in the soft tissues. Nonspecific subcutaneous edema in the plantar aspect of the foot and heel. IMPRESSION: 1. Findings consistent with a soft tissue abscess of the ball of the foot at the level of the first and second MTP joints. The abscess extends between the heads of the first and second metatarsals. 2. No visible foreign body. 3. Subcutaneous edema on the dorsum of the foot is nonspecific but could represent cellulitis. 4. No osteomyelitis or appreciable joint effusion. Electronically Signed   By: Lorriane Shire M.D.   On: 01/06/2018 15:39   Dg  Foot Complete Right  Result Date: 01/03/2018 CLINICAL DATA:  Stepped on unknown object, with right great toe erythema and pain. Initial encounter. EXAM: RIGHT FOOT COMPLETE - 3+ VIEW COMPARISON:  None. FINDINGS: There is no evidence of fracture or dislocation. Subcortical cystic change at the distal first metatarsal is of uncertain significance. Would correlate clinically to exclude gout. The joint spaces are preserved. There is no evidence of talar subluxation; the subtalar joint is unremarkable in appearance. A plantar calcaneal spur is noted. Soft tissue swelling is noted about the great toe. No radiopaque foreign bodies are seen. IMPRESSION: 1. No evidence of fracture or dislocation. 2. Subcortical cystic change at the distal first metatarsal is of uncertain significance. Would correlate clinically to exclude gout. 3. Soft tissue swelling about the great toe. No radiopaque foreign bodies seen.  Electronically Signed   By: Garald Balding M.D.   On: 01/03/2018 03:52   Discharge Exam: Vitals:   01/08/18 2118 01/09/18 0538  BP: (!) 130/55 (!) 108/55  Pulse: 68 66  Resp: 17 16  Temp: 98.1 F (36.7 C) 97.8 F (36.6 C)  SpO2: 95% 97%   Vitals:   01/08/18 0600 01/08/18 1409 01/08/18 2118 01/09/18 0538  BP: (!) 177/92 127/65 (!) 130/55 (!) 108/55  Pulse: 81 60 68 66  Resp: 18 20 17 16   Temp: 98.1 F (36.7 C) 98.6 F (37 C) 98.1 F (36.7 C) 97.8 F (36.6 C)  TempSrc: Oral Oral Oral Oral  SpO2: 100% 99% 95% 97%  Weight:      Height:        General: Pt is alert, awake, not in acute distress Cardiovascular: RRR, S1/S2 +, no rubs, no gallops Respiratory: CTA bilaterally, no wheezing, no rhonchi Abdominal: Soft, NT, ND, bowel sounds + Extremities: no edema, no cyanosis    The results of significant diagnostics from this hospitalization (including imaging, microbiology, ancillary and laboratory) are listed below for reference.     Microbiology: Recent Results (from the past 240 hour(s))  MRSA PCR Screening     Status: None   Collection Time: 01/03/18  8:30 AM  Result Value Ref Range Status   MRSA by PCR NEGATIVE NEGATIVE Final    Comment:        The GeneXpert MRSA Assay (FDA approved for NASAL specimens only), is one component of a comprehensive MRSA colonization surveillance program. It is not intended to diagnose MRSA infection nor to guide or monitor treatment for MRSA infections. Performed at [facility] [address], [place], [place] [address]   Culture, blood (routine x 2)     Status: None (Preliminary result)   Collection Time: 01/06/18  3:49 PM  Result Value Ref Range Status   Specimen Description BLOOD RIGHT HAND  Final   Special Requests   Final    BOTTLES DRAWN AEROBIC AND ANAEROBIC Blood Culture adequate volume   Culture   Final    NO GROWTH 2 DAYS Performed at [facility], [address] [place], [place] [address]    Report Status PENDING   Incomplete  Culture, blood (routine x 2)     Status: None (Preliminary result)   Collection Time: 01/06/18  4:09 PM  Result Value Ref Range Status   Specimen Description BLOOD RIGHT HAND  Final   Special Requests   Final    BOTTLES DRAWN AEROBIC ONLY Blood Culture adequate volume   Culture   Final    NO GROWTH 2 DAYS Performed at [facility], [address],  [place], [place] [address]    Report Status PENDING  Incomplete  Aerobic/Anaerobic Culture (surgical/deep wound)     Status: None (Preliminary result)   Collection Time: 01/07/18 12:05 PM  Result Value Ref Range Status   Specimen Description   Final    ABSCESS FOOT RIGHT Performed at [facility], [address], [place], [place] [address]    Special Requests   Final    NONE Performed at [facility], [address], [place], [place] [address]    Gram Stain   Final    MODERATE WBC PRESENT,BOTH PMN AND MONONUCLEAR FEW GRAM POSITIVE COCCI Performed at [facility], [place] [address] [place],  [address]    Culture MODERATE STAPHYLOCOCCUS AUREUS  Final   Report Status PENDING  Incomplete   Organism ID, Bacteria STAPHYLOCOCCUS AUREUS  Final      Susceptibility   Staphylococcus aureus - MIC*    CIPROFLOXACIN <=0.5 SENSITIVE Sensitive     ERYTHROMYCIN <=0.25 SENSITIVE Sensitive     GENTAMICIN <=0.5 SENSITIVE Sensitive     OXACILLIN 0.5 SENSITIVE Sensitive     TETRACYCLINE <=1 SENSITIVE Sensitive     VANCOMYCIN <=0.5 SENSITIVE Sensitive     TRIMETH/SULFA <=10 SENSITIVE Sensitive     CLINDAMYCIN <=0.25 SENSITIVE Sensitive     RIFAMPIN <=0.5 SENSITIVE Sensitive     Inducible Clindamycin NEGATIVE Sensitive     * MODERATE STAPHYLOCOCCUS AUREUS     Labs: BNP (last 3 results) No results for input(s): BNP in the last 8760 hours. Basic Metabolic Panel: Recent Labs  Lab 01/04/18 0559 01/05/18 0515 01/06/18 1301 01/07/18 0454 01/09/18 0645  NA 136 139 139 136 136  K 4.3 4.0 4.0 4.0 3.9  CL 106 105 105 103 102   CO2 26 29 25 25 28   GLUCOSE 167* 95 94 89 156*  BUN 11 8 10 8 6   CREATININE 0.54 0.53 0.49 0.56 0.57  CALCIUM 8.5* 8.1* 8.6* 8.4* 9.1  MG 1.8  --   --   --   --    Liver Function Tests: Recent Labs  Lab 01/03/18 0315 01/04/18 0559  AST 81* 45*  ALT 51 40  ALKPHOS 69 66  BILITOT 2.1* 0.8  PROT 6.9 6.0*  ALBUMIN 3.5 2.9*   No results for input(s): LIPASE, AMYLASE in the last 168 hours. Recent Labs  Lab 01/03/18 0315  AMMONIA 100*   CBC: Recent Labs  Lab 01/04/18 0559 01/05/18 0515 01/06/18 1301 01/07/18 0454 01/09/18 0645  WBC 7.5 4.0 5.4 7.6 3.2*  NEUTROABS  --   --  3.6  --   --   HGB 11.6* 11.7* 12.6 12.2 12.9  HCT 35.1* 36.5 38.2 37.5 41.0  MCV 86.7 87.7 87.8 87.4 88.2  PLT 80* 78* 75* 102* 90*   Cardiac Enzymes: No results for input(s): CKTOTAL, CKMB, CKMBINDEX, TROPONINI in the last 168 hours. BNP: Invalid input(s): POCBNP CBG: No results for input(s): GLUCAP in the last 168 hours. D-Dimer No results for input(s): DDIMER in the last 72 hours. Hgb A1c No results for input(s): HGBA1C in the last 72 hours. Lipid Profile No results for input(s): CHOL, HDL, LDLCALC, TRIG, CHOLHDL, LDLDIRECT in the last 72 hours. Thyroid function studies No results for input(s): TSH, T4TOTAL, T3FREE, THYROIDAB in the last 72 hours.  Invalid input(s): FREET3 Anemia work up No results for input(s): VITAMINB12, FOLATE, FERRITIN, TIBC, IRON, RETICCTPCT in the last 72 hours. Urinalysis    Component Value Date/Time   COLORURINE AMBER (A) 05/13/2015 0630  APPEARANCEUR CLOUDY (A) 05/13/2015 0630   LABSPEC 1.022 05/13/2015 0630   PHURINE 6.0 05/13/2015 0630   GLUCOSEU NEGATIVE 05/13/2015 0630   HGBUR TRACE (A) 05/13/2015 0630   BILIRUBINUR NEGATIVE 05/13/2015 0630   BILIRUBINUR neg 03/09/2014 1443   KETONESUR NEGATIVE 05/13/2015 0630   PROTEINUR NEGATIVE 05/13/2015 0630   UROBILINOGEN 1.0 05/13/2015 0630   NITRITE POSITIVE (A) 05/13/2015 0630   LEUKOCYTESUR MODERATE  (A) 05/13/2015 0630   Sepsis Labs Invalid input(s): PROCALCITONIN,  WBC,  LACTICIDVEN Microbiology Recent Results (from the past 240 hour(s))  MRSA PCR Screening     Status: None   Collection Time: 01/03/18  8:30 AM  Result Value Ref Range Status   MRSA by PCR NEGATIVE NEGATIVE Final    Comment:        The GeneXpert MRSA Assay (FDA approved for NASAL specimens only), is one component of a comprehensive MRSA colonization surveillance program. It is not intended to diagnose MRSA infection nor to guide or monitor treatment for MRSA infections. Performed at [facility], [address], [place], [place] [address]   Culture, blood (routine x 2)     Status: None (Preliminary result)   Collection Time: 01/06/18  3:49 PM  Result Value Ref Range Status   Specimen Description BLOOD RIGHT HAND  Final   Special Requests   Final    BOTTLES DRAWN AEROBIC AND ANAEROBIC Blood Culture adequate volume   Culture   Final    NO GROWTH 2 DAYS Performed at [facility], [address] [place], [place] [address]    Report Status PENDING  Incomplete  Culture, blood (routine x 2)     Status: None (Preliminary result)   Collection Time: 01/06/18  4:09 PM  Result Value Ref Range Status   Specimen Description BLOOD RIGHT HAND  Final   Special Requests   Final    BOTTLES DRAWN AEROBIC ONLY Blood Culture adequate volume   Culture   Final    NO GROWTH 2 DAYS Performed at [facility], [address] [place], [place] [address]    Report Status PENDING  Incomplete  Aerobic/Anaerobic Culture (surgical/deep wound)     Status: None (Preliminary result)   Collection Time: 01/07/18 12:05 PM  Result Value Ref Range Status   Specimen Description   Final    ABSCESS FOOT RIGHT Performed at [facility], [address], [place], [place] [address]    Special Requests   Final    NONE Performed at [facility], [address] [place], [place] [address]    Gram Stain   Final    MODERATE WBC PRESENT,BOTH PMN  AND MONONUCLEAR FEW GRAM POSITIVE COCCI Performed at [facility], [place] [address], [place], [place] [address]    Culture MODERATE STAPHYLOCOCCUS AUREUS  Final   Report Status PENDING  Incomplete   Organism ID, Bacteria STAPHYLOCOCCUS AUREUS  Final      Susceptibility   Staphylococcus aureus - MIC*    CIPROFLOXACIN <=0.5 SENSITIVE Sensitive     ERYTHROMYCIN <=0.25 SENSITIVE Sensitive     GENTAMICIN <=0.5 SENSITIVE Sensitive     OXACILLIN 0.5 SENSITIVE Sensitive     TETRACYCLINE <=1 SENSITIVE Sensitive     VANCOMYCIN <=0.5 SENSITIVE Sensitive     TRIMETH/SULFA <=10 SENSITIVE Sensitive     CLINDAMYCIN <=0.25 SENSITIVE Sensitive     RIFAMPIN <=0.5 SENSITIVE Sensitive     Inducible Clindamycin NEGATIVE Sensitive     * MODERATE STAPHYLOCOCCUS AUREUS     Time coordinating discharge:  40 minutes  SIGNED:   Rodena Goldmann, DO [facility] 01/09/2018, 2:21 PM Pager [phone]  If 7PM-7AM, please contact night-coverage [email or web address] Password [email or web address]

## 2018-01-09 NOTE — Progress Notes (Signed)
Removed IV-clean, dry, intact. Reviewed d/c paperwork including home health and new medications. Answered all questions. Wheeled stable patient to main entrance where she was picked up by her son.

## 2018-01-11 LAB — CULTURE, BLOOD (ROUTINE X 2)
CULTURE: NO GROWTH
CULTURE: NO GROWTH
SPECIAL REQUESTS: ADEQUATE
Special Requests: ADEQUATE

## 2018-01-12 LAB — AEROBIC/ANAEROBIC CULTURE (SURGICAL/DEEP WOUND)

## 2018-01-12 LAB — AEROBIC/ANAEROBIC CULTURE W GRAM STAIN (SURGICAL/DEEP WOUND)

## 2019-04-18 ENCOUNTER — Other Ambulatory Visit (HOSPITAL_COMMUNITY): Payer: Self-pay

## 2019-04-18 DIAGNOSIS — K7030 Alcoholic cirrhosis of liver without ascites: Secondary | ICD-10-CM

## 2019-04-19 ENCOUNTER — Other Ambulatory Visit (HOSPITAL_COMMUNITY): Payer: Self-pay | Admitting: *Deleted

## 2019-04-19 ENCOUNTER — Other Ambulatory Visit: Payer: Self-pay | Admitting: *Deleted

## 2019-04-19 DIAGNOSIS — Z8619 Personal history of other infectious and parasitic diseases: Secondary | ICD-10-CM

## 2019-05-22 IMAGING — CT CT FOOT*R* W/CM
3 of 7 series · 9 of 34 positions shown, 10 images · IV contrast (agent unspecified)
Comparison: Radiographs dated 01/03/2018

CONTRAST:  75mL IFLD3I-N55 IOPAMIDOL (IFLD3I-N55) INJECTION 61%

CLINICAL DATA: Progressive cellulitis with swelling and redness and
warmth of the right foot.

EXAM:
CT OF THE LOWER RIGHT EXTREMITY WITH CONTRAST
TECHNIQUE: Multidetector CT imaging of the lower right extremity was performed
according to the standard protocol following intravenous contrast
administration.

[Series 8: axial st · coronal · 0.29mm/px · 2 of 251 slices shown]
[im 106/251  bone]
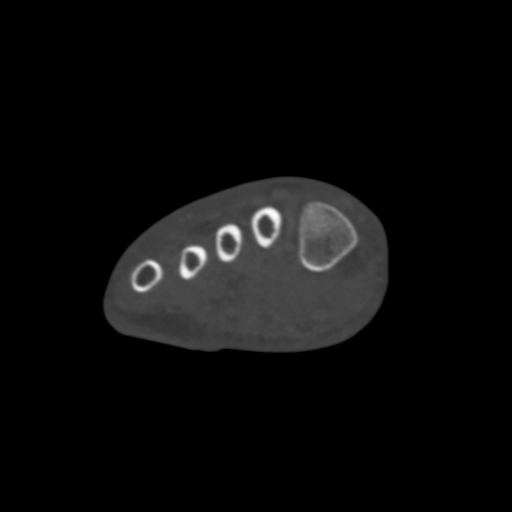
[im 212/251  bone]
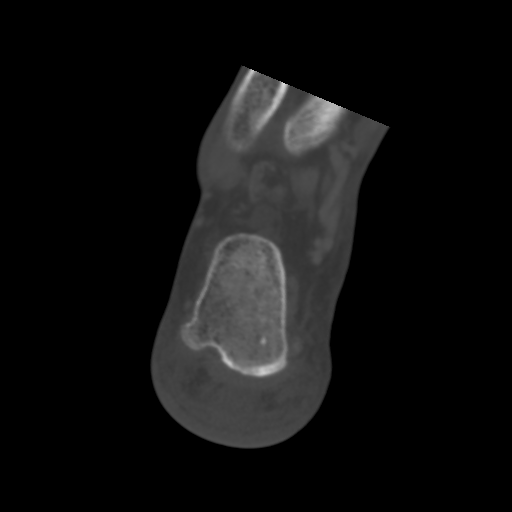

[Series 10: sag st · sagittal · 0.25mm/px · 6 of 120 slices shown]
[im 15/120  bone]
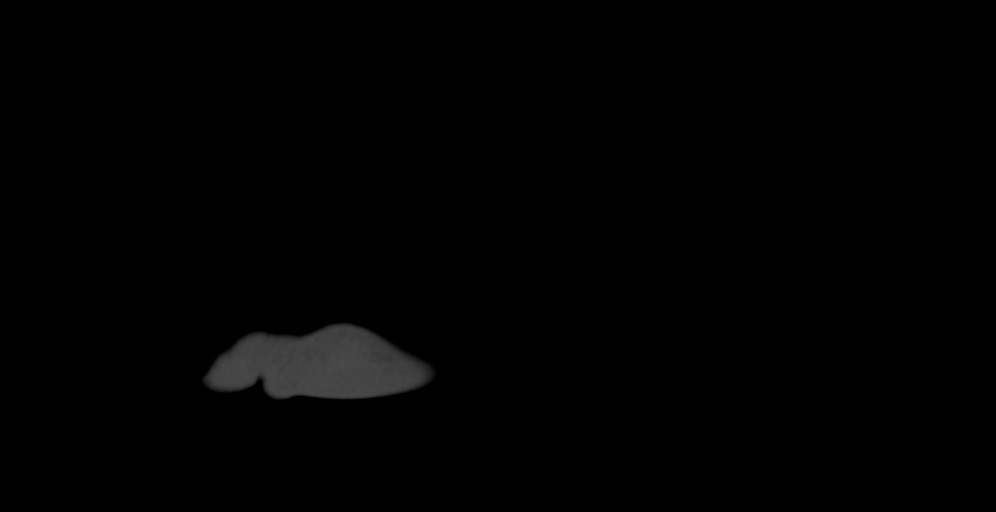
[im 24/120  soft-tissue]
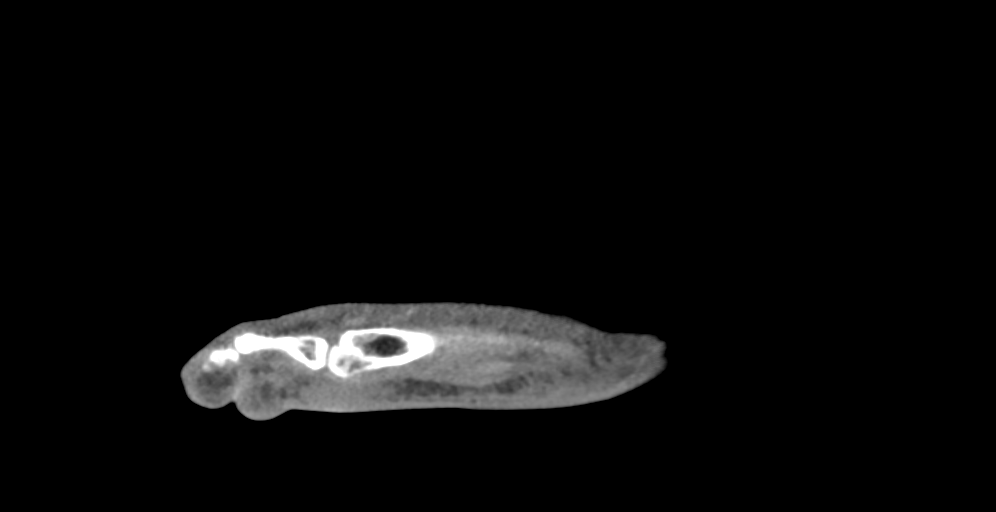
[im 29/120  bone]
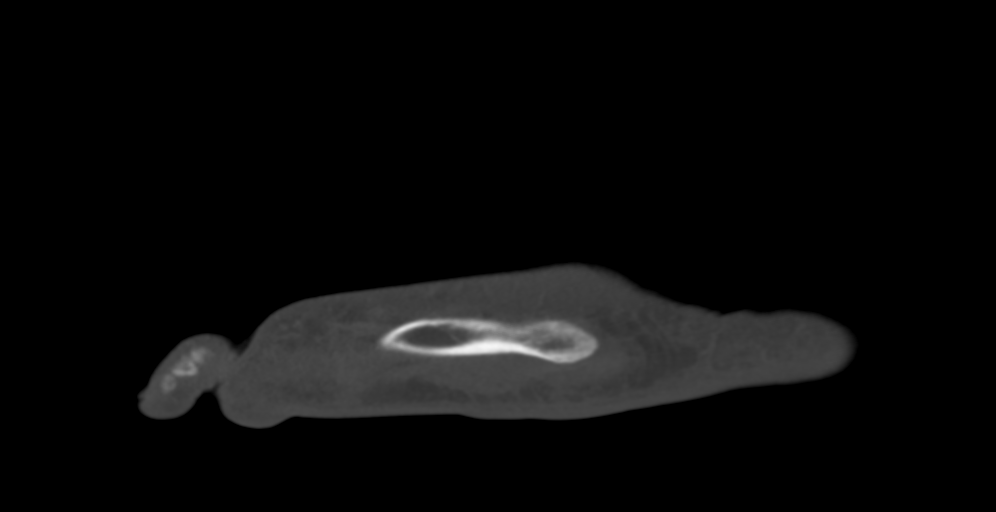
[im 43/120  bone]
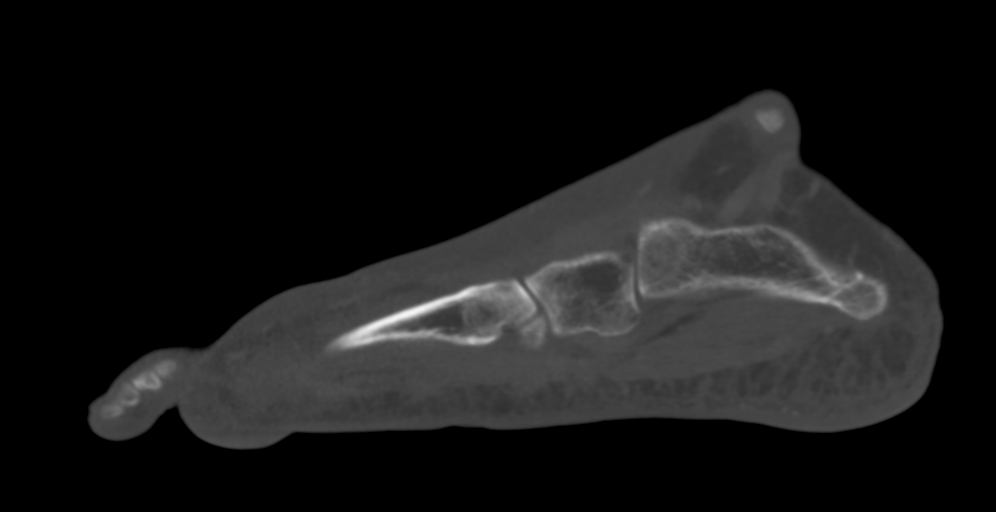
[im 57/120  bone]
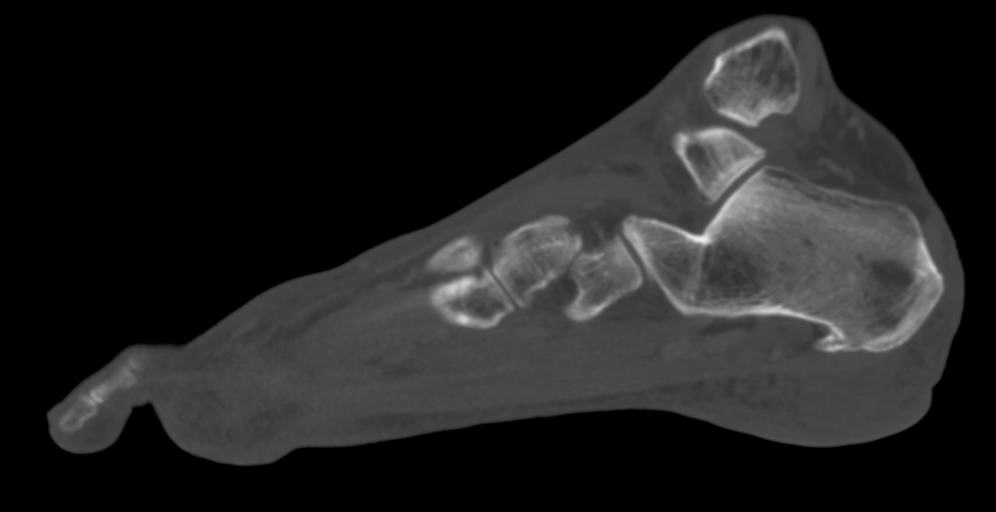
[im 71/120  bone]
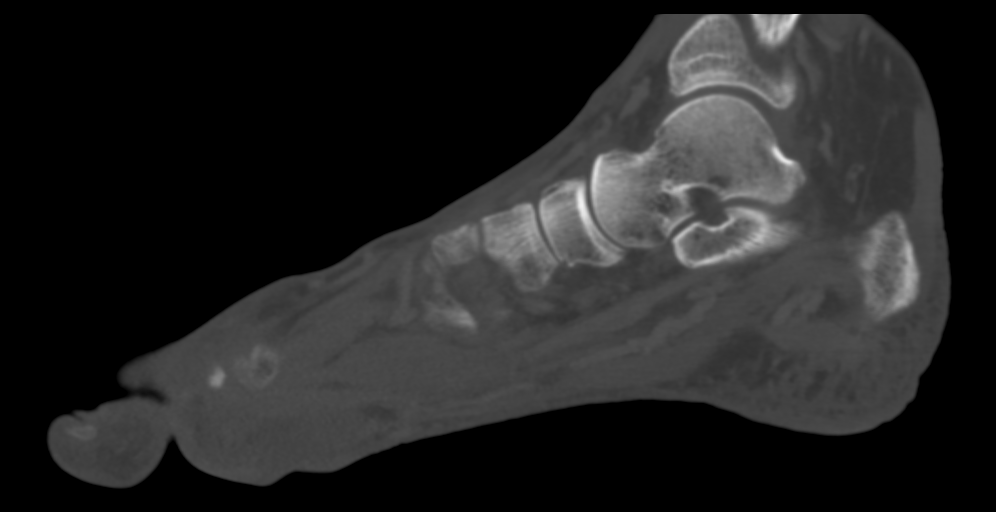

[Series 17: cor st · axial · 0.21mm/px · z∈[-137,-137]mm · 1 of 120 slices shown, 2 images]
[im 60/120  soft-tissue]
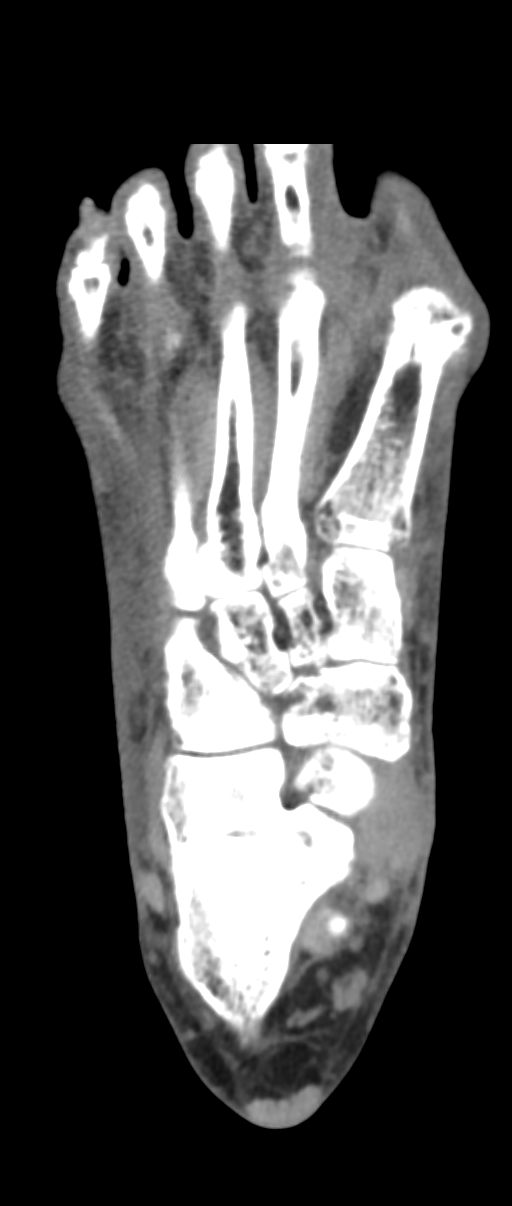
[im 60/120  bone]
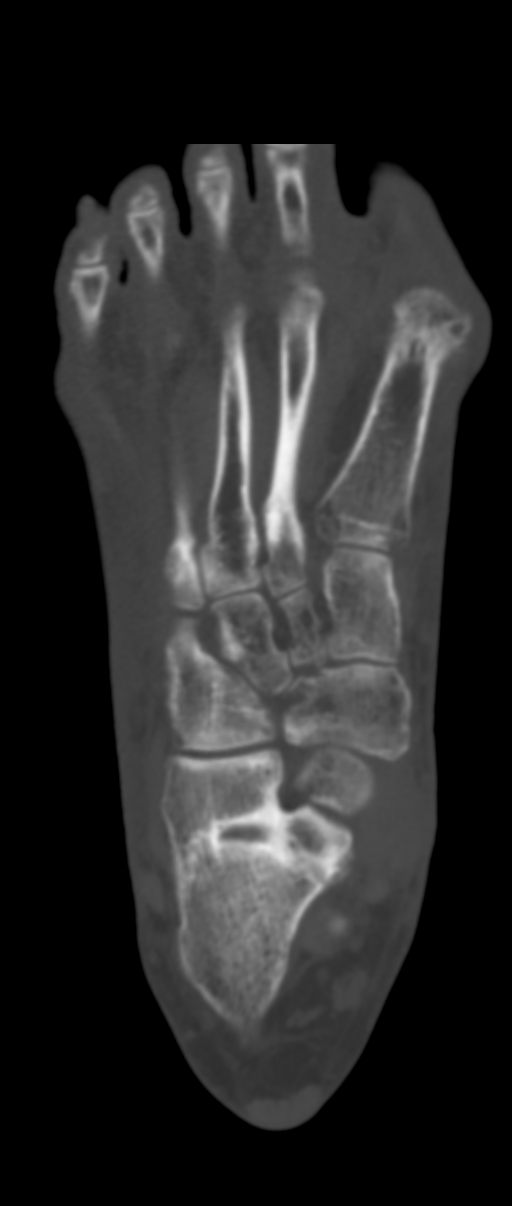

[9 of 34 positions shown; findings below may reference images not displayed]

FINDINGS: Bones/Joint/Cartilage

No significant abnormalities of the bones. No discrete osteomyelitis
or joint effusion.

Muscles and Tendons

Negative.

Soft tissues

3.5 x 2.0 x 2.8 cm soft tissue fluid collection in the ball of the
foot at the level of the first and second MTP joints. The fluid
tracks between the heads of the first and second metatarsals toward
the dorsum of the foot.

There is nonspecific subcutaneous edema on the dorsum of the foot.

No visible foreign bodies in the soft tissues. Nonspecific
subcutaneous edema in the plantar aspect of the foot and heel.
IMPRESSION: 1. Findings consistent with a soft tissue abscess of the ball of the
foot at the level of the first and second MTP joints. The abscess
extends between the heads of the first and second metatarsals.
2. No visible foreign body.
3. Subcutaneous edema on the dorsum of the foot is nonspecific but
could represent cellulitis.
4. No osteomyelitis or appreciable joint effusion.

## 2021-03-04 ENCOUNTER — Other Ambulatory Visit (HOSPITAL_COMMUNITY): Payer: Self-pay | Admitting: Family Medicine

## 2021-03-04 ENCOUNTER — Ambulatory Visit (HOSPITAL_COMMUNITY)
Admission: RE | Admit: 2021-03-04 | Discharge: 2021-03-04 | Disposition: A | Discharge status: Home / Self Care | Payer: Medicaid Other | Source: Ambulatory Visit | Attending: Family Medicine | Admitting: Family Medicine

## 2021-03-04 ENCOUNTER — Other Ambulatory Visit: Payer: Self-pay

## 2021-03-04 DIAGNOSIS — J441 Chronic obstructive pulmonary disease with (acute) exacerbation: Secondary | ICD-10-CM | POA: Diagnosis not present

## 2021-06-12 ENCOUNTER — Other Ambulatory Visit: Payer: Self-pay | Admitting: Family Medicine

## 2021-06-12 DIAGNOSIS — J449 Chronic obstructive pulmonary disease, unspecified: Secondary | ICD-10-CM

## 2021-10-14 ENCOUNTER — Ambulatory Visit (HOSPITAL_COMMUNITY)
Admission: RE | Admit: 2021-10-14 | Discharge: 2021-10-14 | Disposition: A | Discharge status: Home / Self Care | Payer: Medicaid Other | Source: Ambulatory Visit | Attending: Nurse Practitioner | Admitting: Nurse Practitioner

## 2021-10-14 ENCOUNTER — Other Ambulatory Visit: Payer: Self-pay

## 2021-10-14 ENCOUNTER — Other Ambulatory Visit (HOSPITAL_COMMUNITY): Payer: Self-pay | Admitting: Nurse Practitioner

## 2021-10-14 DIAGNOSIS — M5412 Radiculopathy, cervical region: Secondary | ICD-10-CM

## 2021-11-15 ENCOUNTER — Encounter: Payer: Self-pay | Admitting: Internal Medicine

## 2021-11-15 ENCOUNTER — Ambulatory Visit (INDEPENDENT_AMBULATORY_CARE_PROVIDER_SITE_OTHER): Payer: Medicaid Other | Admitting: Internal Medicine

## 2021-11-15 DIAGNOSIS — R0789 Other chest pain: Secondary | ICD-10-CM | POA: Diagnosis not present

## 2021-11-15 DIAGNOSIS — R0609 Other forms of dyspnea: Secondary | ICD-10-CM | POA: Diagnosis not present

## 2021-11-15 NOTE — Progress Notes (Signed)
? ?Kristen Marsh, female    DOB: 1956-03-07,    MRN: [id number] ? ? ?Brief patient profile:  ?64   yowf active smoker  referred to pulmonary clinic in [place]  11/15/2021 by Blenda Nicely  NP  for ? Copd with onset of symptoms in her ? Mid 40s   ? ? ? ? ?History of Present Illness  ?11/15/2021  Pulmonary/ 1st office eval/ Melvyn Novas / [facility]  ?Chief Complaint  ?Patient presents with  ? Consult  ?  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 ? ?No obvious day to day or daytime variability or assoc excess/ purulent sputum or mucus plugs or hemoptysis or cp or chest tightness, subjective wheeze or overt sinus or hb symptoms.  ? ?  Also denies any obvious fluctuation of symptoms with weather or environmental changes or other aggravating or alleviating factors except as outlined above  ? ?No unusual exposure hx or h/o childhood pna/ asthma or knowledge of premature birth. ? ?Current Allergies, Complete Past Medical History, Past Surgical History, Family History, and Social History were reviewed in [employer] record. ? ?ROS  The following are not active complaints unless bolded ?Hoarseness, sore throat, dysphagia= globus sensation, dental problems, itching, sneezing,  nasal congestion or discharge of excess mucus or purulent secretions, ear ache,   fever, chills, sweats, unintended wt loss or wt gain, classically pleuritic or exertional cp,  orthopnea pnd or arm/hand swelling  or leg swelling, presyncope, palpitations, abdominal pain, anorexia, nausea, vomiting, diarrhea  or change in bowel habits or change in  bladder habits, change in stools or change in urine, dysuria, hematuria,  rash, arthralgias, visual complaints, headache, numbness, weakness or ataxia or problems with walking or coordination,  change in mood or  memory. ?      ?   ? ? ? ?Past Medical History:  ?Diagnosis Date  ? Anxiety   ? Cirrhosis of liver ([place])   ? Depression   ? Emphysema   ? Fibromyalgia   ? GERD (gastroesophageal reflux disease)   ? Hep C w/o coma, chronic (HCC)   ? Hypertension   ? IBS (irritable bowel syndrome)   ? Kidney stones   ? MRSA carrier   ? Osteoarthritis   ? Pneumonia   ? As a child  ? Psoriasis   ? Due to alcohol  ? Shortness of breath   ? with exertion  ? Varicose vein   ? Of esophagus and top of stomach per pt  ? ? ?Outpatient Medications Prior to Visit - - NOTE:   Unable to verify as accurately reflecting what pt takes    ?Medication Sig Dispense Refill  ? albuterol (PROVENTIL) (2.5 MG/3ML) 0.083% nebulizer solution Take 3 mLs (2.5 mg total) by nebulization every 6 (six) hours as needed for wheezing or shortness of breath. 75 mL 3  ? camphor-menthol (SARNA) lotion Apply topically as needed for itching. 222 mL 0  ? cyclobenzaprine (FLEXERIL) 5 MG tablet TAKE ONE TABLET BY  MOUTH 3 TIMES DAILY AS NEEDED FOR MUSCLE SPASM(S). 45 tablet 0  ? diphenhydrAMINE (BENADRYL) 25 MG tablet Take 50 mg by mouth every 6 (six) hours as needed.    ? ibuprofen (ADVIL,MOTRIN) 600 MG tablet Take 1 tablet (600 mg total) by mouth every 8 (eight) hours as needed for moderate pain. 40 tablet 0  ? ipratropium-albuterol (DUONEB) 0.5-2.5 (3) MG/3ML SOLN SMARTSIG:1 Vial(s) Via Nebulizer Every 6-8 Hours PRN    ? Multiple Vitamin (MULTIVITAMIN) tablet Take 1 tablet by mouth daily.    ? omeprazole (PRILOSEC) 20 MG capsule Take 20 mg by mouth 2 (two) times daily.    ? Potassium Gluconate 2 MEQ TABS Take 595 mg by mouth 3 (three) times a week.     ? pregabalin (LYRICA) 75 MG capsule Take 75 mg by mouth 2 (two) times daily.    ? PROVENTIL HFA 108 (90 Base)  MCG/ACT inhaler INHALE 2 PUFFS INTO THE LUNGS EVERY FOUR HOURS AS NEEDED FOR WHEEZING. 6.7 g 5  ? SYMBICORT 160-4.5 MCG/ACT inhaler Inhale 2 puffs into the lungs 2 (two) times daily.    ? CLOBETASOL PROPIONATE E 0.05 % emollient cream SMARTSIG:sparingly Topical Twice Daily    ? gabapentin (NEURONTIN) 400 MG capsule Take 2 capsules (800 mg total) by mouth 3 (three) times daily. 180 capsule 0  ? oxyCODONE (OXY IR/ROXICODONE) 5 MG immediate release tablet Take 1 tablet (5 mg total) by mouth every 4 (four) hours as needed for moderate pain. 30 tablet 0  ? pregabalin (LYRICA) 50 MG capsule Take 50 mg by mouth 2 (two) times daily.    ? ?No facility-administered medications prior to visit.  ? ? ? ?Objective:  ?  ? ?BP 132/88 (BP Location: Left Arm, Patient Position: Sitting)   Pulse 98   Temp 98.7 ?F (37.1 ?C) (Temporal)   Ht '5\' 2"'$  (1.575 m)   Wt 160 lb 3.2 oz (72.7 kg)   SpO2 97% Comment: ra  BMI 29.30 kg/m?  ? ?SpO2: 97 % (ra)  very hoarse amb wf nad ? ?HEENT : clear  / mild pseudowheeze  ? ?NECK :  without JVD/Nodes/TM/ nl carotid upstrokes bilaterally ? ? ?LUNGS: no acc muscle use,  Min barrel  contour chest wall with bilateral  slightly decreased bs s audible wheeze and  without cough on insp or exp maneuvers and min  Hyperresonant  to  percussion bilaterally   ? ? ?CV:  RRR  no s3  2-3 / 6 SEM or increase in P2, and no edema  ? ?ABD:  obese soft and nontender with pos end  insp Hoover's  in the supine position. No bruits or organomegaly appreciated, bowel sounds nl ? ?MS:   Nl gait/  ext warm without deformities, calf tenderness, cyanosis or clubbing ?No obvious joint restrictions  ? ?SKIN: warm and dry without lesions   ? ?NEURO:  alert, approp, nl sensorium with  no motor or cerebellar deficits apparent.  ?    ? ?CXR PA and Lateral:   11/15/2021 :    ?I personally reviewed images and agree with radiology impression as follows:    ?Did not go for cxr as rec  ? ?   ?Assessment  ? ?No problem-specific Assessment  & Plan notes found for this encounter. ? ? ? ? ?Christinia Gully, MD ?11/15/2021 ?   ?

## 2021-11-15 NOTE — Patient Instructions (Addendum)
Prilosec Take 30- 60 min before your first and last meals of the day  ? ?We will order you a new nebulizer   ? ?Only use your albuterol as a rescue medication to be used if you can't catch your breath by resting or doing a relaxed purse lip breathing pattern.  ?- The less you use it, the better it will work when you need it. ?- Ok to use up to 2 puffs (or the nebulizer)  every 4 hours if you must but call for immediate appointment if use goes up over your usual need ?- Don't leave home without it !!  (think of it like the spare tire for your car)  ? ?The key is to stop smoking completely before smoking completely stops you! ? ?We will be referring you for an echo cardiogram at [facility]  ? ?We will also try to get you PFTs asap - may need to go to [facility] for this  ? ?Please remember to go to the lab department   for your tests - we will call you with the results when they are available. ?    ? ?Please remember to go to the  x-ray department  @  [facility] for your tests - we will call you with the results when they are available    ? ?Please schedule a follow up office visit in 6 weeks, call sooner if needed  ? ?  ?   ? ? ? ? ? ? ?

## 2021-11-16 ENCOUNTER — Encounter: Payer: Self-pay | Admitting: Internal Medicine

## 2021-11-16 DIAGNOSIS — R0789 Other chest pain: Secondary | ICD-10-CM | POA: Insufficient documentation

## 2021-11-16 NOTE — Assessment & Plan Note (Addendum)
Onset Jan 2023 positional R shoulder and neck s true radicular pattern   ?- C spine 10/14/21 Multilevel spondylosis and facet hypertrophy, most pronounced at C4-5 and C6-7.  ? ?I rec at a minimum cxr to r/o pancoast tumor though not apparent on c spine imaging from 10/21/21 which included both apices and  she did not go for cxr as rec.   She is techically eligible for LDSCT chest which needs to be completed anyway so will defer this to PCP or discuss at next ov. ? ? ?    ?  ? ? Medical decision making was a high level of complexity in this case because of a chronic condition/diagnosiswith   Progression  requiring extra time for  H and P, chart review, counseling, and reviewing how / when to use HFA and generating customized AVS unique to this office visit and same day charting for this pt new to me.  ?  ?Each maintenance medication was reviewed in detail including emphasizing most importantly the difference between maintenance and prns and under what circumstances the prns are to be triggered using an action plan format where appropriate. Please see avs for details which were reviewed in writing by both me and my nurse and patient given a written copy highlighted where appropriate with yellow highlighter for the patient's continued care at home along with an updated version of their medications.  Patient was asked to maintain medication reconciliation by comparing this list to the actual medications being used at home and to contact this office right away if there is a conflict or discrepancy.   ? ?     ?  ?     ?

## 2021-11-16 NOTE — Assessment & Plan Note (Addendum)
Onset in her 44s / active smoker  ?- Echo 11/15/2021 >>> ?- PFTs 11/15/2021 >>> ?- Labs ordered 11/15/2021  :  allergy screen   alpha one AT phenotype   ? ?DDX of  difficult airways management almost all start with A and  include Adherence, Ace Inhibitors, Acid Reflux, Active Sinus Disease, Alpha 1 Antitripsin deficiency, Anxiety masquerading as Airways dz,  ABPA,  Allergy(esp in young), Aspiration (esp in elderly), Adverse effects of meds,  Active smoking or vaping, A bunch of PE's (a small clot burden can't cause this syndrome unless there is already severe underlying pulm or vascular dz with poor reserve) plus two Bs  = Bronchiectasis and Beta blocker use..and one C= CHF  ? ?Adherence is always the initial "prime suspect" and is a multilayered concern that requires a "trust but verify" approach in every patient - starting with knowing how to use medications, especially inhalers, correctly, keeping up with refills and understanding the fundamental difference between maintenance and prns vs those medications only taken for a very short course and then stopped and not refilled.  ?- - The proper method of use, as well as anticipated side effects, of a metered-dose inhaler were discussed and demonstrated to the patient using teach back method.  ?- return with all meds in hand using a trust but verify approach to confirm accurate Medication  Reconciliation The principal here is that until we are certain that the  patients are doing what we've asked, it makes no sense to ask them to do more.  ? ?Active smoking > (see separate a/p)  ? ?? Acid (or non-acid) GERD > always difficult to exclude as up to 75% of pts in some series report no assoc GI/ Heartburn symptoms> rec max (24h)  acid suppression and diet restrictions/ reviewed and instructions given in writing.  ? ?? Allergy/asthma/copd  > send screen, continue symbicort for now and check pfts asap  ? ?? Alpha one def > send phenotype  ? ?? Anxiety/depression/  deconditioning > usually at the bottom of this list of usual suspects but should be much higher on this pt's based on H and P and may interfere with adherence and also interpretation of response or lack thereof to symptom management which can be quite subjective.  ? ?? Active rhinitis/ sinusitis/ upper airway obstruction > ent eval asap> referred back to Dr Joya Gaskins at [facility]  ? ?? Adverse effects of meds > none of the usual suspects listed but have not done full med reconciliation > return with meds as above ? ?? chf component > she has impressive SEM > send bnp check echo  ? ? ?

## 2021-11-18 LAB — CBC WITH DIFFERENTIAL/PLATELET
Basophils Absolute: 0.1 10*3/uL (ref 0.0–0.2)
Basos: 1 %
EOS (ABSOLUTE): 0.2 10*3/uL (ref 0.0–0.4)
Eos: 4 %
Hematocrit: 36.1 % (ref 34.0–46.6)
Hemoglobin: 11.9 g/dL (ref 11.1–15.9)
Immature Grans (Abs): 0 10*3/uL (ref 0.0–0.1)
Immature Granulocytes: 0 %
Lymphocytes Absolute: 0.8 10*3/uL (ref 0.7–3.1)
Lymphs: 20 %
MCH: 25.5 pg — ABNORMAL LOW (ref 26.6–33.0)
MCHC: 33 g/dL (ref 31.5–35.7)
MCV: 78 fL — ABNORMAL LOW (ref 79–97)
Monocytes Absolute: 0.5 10*3/uL (ref 0.1–0.9)
Monocytes: 13 %
Neutrophils Absolute: 2.4 10*3/uL (ref 1.4–7.0)
Neutrophils: 62 %
Platelets: 98 10*3/uL — CL (ref 150–450)
RBC: 4.66 x10E6/uL (ref 3.77–5.28)
RDW: 14.3 % (ref 11.7–15.4)
WBC: 4 10*3/uL (ref 3.4–10.8)

## 2021-11-18 LAB — BASIC METABOLIC PANEL
BUN/Creatinine Ratio: 20 (ref 12–28)
BUN: 11 mg/dL (ref 8–27)
CO2: 24 mmol/L (ref 20–29)
Calcium: 9.2 mg/dL (ref 8.7–10.3)
Chloride: 108 mmol/L — ABNORMAL HIGH (ref 96–106)
Creatinine, Ser: 0.55 mg/dL — ABNORMAL LOW (ref 0.57–1.00)
Glucose: 91 mg/dL (ref 70–99)
Potassium: 4 mmol/L (ref 3.5–5.2)
Sodium: 142 mmol/L (ref 134–144)
eGFR: 102 mL/min/{1.73_m2} (ref >59)

## 2021-11-18 LAB — TSH: TSH: 3.52 u[IU]/mL (ref 0.450–4.500)

## 2021-11-18 LAB — ALPHA-1-ANTITRYPSIN PHENOTYP: A-1 Antitrypsin: 136 mg/dL (ref 101–187)

## 2021-11-18 LAB — BRAIN NATRIURETIC PEPTIDE: BNP: 43.7 pg/mL (ref 0.0–100.0)

## 2021-11-18 LAB — IGE: IgE (Immunoglobulin E), Serum: 10 IU/mL (ref 6–495)

## 2021-11-20 NOTE — Progress Notes (Signed)
Tried calling the pt and there was no answer and her VM is not set up yet. Will call back.

## 2021-11-28 ENCOUNTER — Ambulatory Visit (HOSPITAL_COMMUNITY): Admission: RE | Admit: 2021-11-28 | Payer: Medicaid Other | Source: Ambulatory Visit

## 2021-12-03 NOTE — Progress Notes (Signed)
Called the pt and there was no answer and no option to leave msg, WCB.  ?

## 2021-12-31 ENCOUNTER — Ambulatory Visit: Payer: Medicaid Other | Admitting: Internal Medicine

## 2021-12-31 NOTE — Progress Notes (Deleted)
Kristen Marsh, female    DOB: 12-08-1955,    MRN: [id number]   Brief patient profile:  15   yowf active smoker  referred to pulmonary clinic in [place]  11/15/2021 by Blenda Nicely  NP  for ? Copd with onset of symptoms in her ? 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 We will be referring you for an echo cardiogram at [facility]  We will also try to get you PFTs asap -  not done as of 12/31/2021   Please remember to go to the lab department   PLT 98   Please remember to go to the  x-ray department   > did not go        12/31/2021  f/u ov/[place] office/Kristen Marsh re: *** maint on ***  No chief complaint on file.   Dyspnea:  *** Cough: *** Sleeping: *** SABA use: *** 02: *** Covid status: *** Lung cancer screening: ***   No obvious day to day or daytime variability or assoc excess/ purulent sputum or mucus plugs or hemoptysis or cp or chest tightness, subjective wheeze or overt sinus or hb symptoms.   *** without nocturnal  or early am exacerbation  of respiratory  c/o's or need for noct saba. Also denies any obvious fluctuation of symptoms with weather or environmental changes or other aggravating or  alleviating factors except as outlined above   No unusual exposure hx or h/o childhood pna/ asthma or knowledge of premature birth.  Current Allergies, Complete Past Medical History, Past Surgical History, Family History, and Social History were reviewed in [employer] record.  ROS  The following are not active complaints unless bolded Hoarseness, sore throat, dysphagia, dental problems, itching, sneezing,  nasal congestion or discharge of excess mucus or purulent secretions, ear ache,   fever, chills, sweats, unintended wt loss or wt gain, classically pleuritic or exertional cp,  orthopnea pnd or arm/hand swelling  or leg swelling, presyncope, palpitations, abdominal pain, anorexia, nausea, vomiting, diarrhea  or change in bowel habits or change in bladder habits, change in stools or change in urine, dysuria, hematuria,  rash, arthralgias, visual complaints, headache, numbness, weakness or ataxia or problems with walking or coordination,  change in mood or  memory.        No outpatient medications have been marked as taking for the 12/31/21 encounter (Appointment) with Tanda Rockers, MD.                Past Medical History:  Diagnosis Date  Anxiety    Cirrhosis of liver (HCC)    Depression    Emphysema    Fibromyalgia    GERD (gastroesophageal reflux disease)    Hep C w/o coma, chronic (HCC)    Hypertension    IBS (irritable bowel syndrome)    Kidney stones    MRSA carrier    Osteoarthritis    Pneumonia    As a child   Psoriasis    Due to alcohol   Shortness of breath    with exertion   Varicose vein    Of esophagus and top of stomach per pt       Objective:     Wt Readings from Last 3 Encounters:  11/15/21 160 lb 3.2 oz (72.7 kg)  01/06/18 140 lb (63.5 kg)  01/03/18 136 lb 11 oz (62 kg)      Vital signs reviewed  12/31/2021  - Note at rest 02 sats  ***% on ***   General appearance:    ***      Min bar   2-3 / 6 SEM  ***      CXR  PA and Lateral:   11/15/2021 :    I personally reviewed images and agree with radiology impression as follows:    Did not go for cxr as rec      Assessment

## 2022-02-25 ENCOUNTER — Encounter (HOSPITAL_COMMUNITY): Payer: Self-pay | Admitting: Emergency Medicine

## 2022-02-25 ENCOUNTER — Emergency Department (HOSPITAL_COMMUNITY)
Admission: EM | Admit: 2022-02-25 | Discharge: 2022-02-25 | Disposition: A | Discharge status: Home / Self Care | Payer: Medicaid Other | Attending: Emergency Medicine | Admitting: Emergency Medicine

## 2022-02-25 ENCOUNTER — Other Ambulatory Visit: Payer: Self-pay

## 2022-02-25 DIAGNOSIS — L089 Local infection of the skin and subcutaneous tissue, unspecified: Secondary | ICD-10-CM

## 2022-02-25 DIAGNOSIS — Z23 Encounter for immunization: Secondary | ICD-10-CM | POA: Insufficient documentation

## 2022-02-25 DIAGNOSIS — I10 Essential (primary) hypertension: Secondary | ICD-10-CM | POA: Diagnosis not present

## 2022-02-25 DIAGNOSIS — J439 Emphysema, unspecified: Secondary | ICD-10-CM | POA: Insufficient documentation

## 2022-02-25 DIAGNOSIS — L03116 Cellulitis of left lower limb: Secondary | ICD-10-CM | POA: Diagnosis not present

## 2022-02-25 LAB — CBC WITH DIFFERENTIAL/PLATELET
Abs Immature Granulocytes: 0.02 10*3/uL (ref 0.00–0.07)
Basophils Absolute: 0 10*3/uL (ref 0.0–0.1)
Basophils Relative: 1 %
Eosinophils Absolute: 0.1 10*3/uL (ref 0.0–0.5)
Eosinophils Relative: 3 %
HCT: 41.2 % (ref 36.0–46.0)
Hemoglobin: 13.1 g/dL (ref 12.0–15.0)
Immature Granulocytes: 0 %
Lymphocytes Relative: 13 %
Lymphs Abs: 0.6 10*3/uL — ABNORMAL LOW (ref 0.7–4.0)
MCH: 27 pg (ref 26.0–34.0)
MCHC: 31.8 g/dL (ref 30.0–36.0)
MCV: 84.9 fL (ref 80.0–100.0)
Monocytes Absolute: 0.6 10*3/uL (ref 0.1–1.0)
Monocytes Relative: 14 %
Neutro Abs: 3.1 10*3/uL (ref 1.7–7.7)
Neutrophils Relative %: 69 %
Platelets: 91 10*3/uL — ABNORMAL LOW (ref 150–400)
RBC: 4.85 MIL/uL (ref 3.87–5.11)
RDW: 16.9 % — ABNORMAL HIGH (ref 11.5–15.5)
WBC: 4.5 10*3/uL (ref 4.0–10.5)
nRBC: 0 % (ref 0.0–0.2)

## 2022-02-25 LAB — BASIC METABOLIC PANEL
Anion gap: 5 (ref 5–15)
BUN: 11 mg/dL (ref 8–23)
CO2: 27 mmol/L (ref 22–32)
Calcium: 9 mg/dL (ref 8.9–10.3)
Chloride: 105 mmol/L (ref 98–111)
Creatinine, Ser: 0.55 mg/dL (ref 0.44–1.00)
GFR, Estimated: >60 mL/min (ref >60)
Glucose, Bld: 136 mg/dL — ABNORMAL HIGH (ref 70–99)
Potassium: 3.7 mmol/L (ref 3.5–5.1)
Sodium: 137 mmol/L (ref 135–145)

## 2022-02-25 MED ORDER — TETANUS-DIPHTH-ACELL PERTUSSIS 5-2.5-18.5 LF-MCG/0.5 IM SUSY
0.5000 mL | PREFILLED_SYRINGE | Freq: Once | INTRAMUSCULAR | Status: AC
  Administered 2022-02-25: 0.5 mL via INTRAMUSCULAR
  Filled 2022-02-25: qty 0.5

## 2022-02-25 MED ORDER — DOXYCYCLINE HYCLATE 100 MG PO TABS
100.0000 mg | ORAL_TABLET | Freq: Once | ORAL | Status: AC
  Administered 2022-02-25: 100 mg via ORAL
  Filled 2022-02-25: qty 1

## 2022-02-25 MED ORDER — BACITRACIN ZINC 500 UNIT/GM EX OINT
TOPICAL_OINTMENT | CUTANEOUS | Status: AC
  Administered 2022-02-25: 3
  Filled 2022-02-25: qty 2.7

## 2022-02-25 MED ORDER — DOXYCYCLINE HYCLATE 100 MG PO CAPS: 100.0000 mg | ORAL_CAPSULE | Freq: Two times a day (BID) | ORAL | 0 refills | Status: AC

## 2022-02-25 NOTE — Discharge Instructions (Signed)
You were seen today for infection and cellulitis of the wound on your left leg.  Apply bacitracin antibiotic ointment.  Avoid Neosporin.  Take antibiotics as prescribed.  If you develop increasing redness or fever, you should be reevaluated.

## 2022-02-25 NOTE — ED Triage Notes (Signed)
Pt states 2 days ago the left leg went through foot stool. Pt now has swelling to the lower leg with redness.

## 2022-02-25 NOTE — ED Provider Notes (Signed)
[facility] EMERGENCY DEPARTMENT Provider Note   CSN: [id number] Arrival date & time: 02/25/22  0040     History  Chief Complaint  Patient presents with   Wound Infection    Kristen Marsh is a 66 y.o. female.  HPI     This is a 66 year old female who presents with concern for wound infection.  Patient reports that she stepped up on a piece of furniture that collapsed under her left foot.  There were exposed screws and she sustained an injury to the left lower extremity.  This was Friday evening.  Since that time she has been using peroxide and Neosporin.  She states that the wound has gotten more red.  No drainage.  She is not experienced any systemic symptoms such as fevers.  No known history of diabetes.  She was concerned that it was not healing well.  Home Medications Prior to Admission medications   Medication Sig Start Date End Date Taking? Authorizing Provider  doxycycline (VIBRAMYCIN) 100 MG capsule Take 1 capsule (100 mg total) by mouth 2 (two) times daily. 02/25/22  Yes Peder Allums, Barbette Hair, MD  albuterol (PROVENTIL) (2.5 MG/3ML) 0.083% nebulizer solution Take 3 mLs (2.5 mg total) by nebulization every 6 (six) hours as needed for wheezing or shortness of breath. 07/09/15   Sharion Balloon, FNP  camphor-menthol [facility]) lotion Apply topically as needed for itching. 01/09/18   Manuella Ghazi, Pratik D, DO  cyclobenzaprine (FLEXERIL) 5 MG tablet TAKE ONE TABLET BY MOUTH 3 TIMES DAILY AS NEEDED FOR MUSCLE SPASM(S). 01/05/18   Barton Dubois, MD  diphenhydrAMINE (BENADRYL) 25 MG tablet Take 50 mg by mouth every 6 (six) hours as needed.    [provider]  ibuprofen (ADVIL,MOTRIN) 600 MG tablet Take 1 tablet (600 mg total) by mouth every 8 (eight) hours as needed for moderate pain. 01/05/18   Barton Dubois, MD  ipratropium-albuterol (DUONEB) 0.5-2.5 (3) MG/3ML SOLN SMARTSIG:1 Vial(s) Via Nebulizer Every 6-8 Hours PRN 11/11/21   [provider]  Multiple Vitamin (MULTIVITAMIN) tablet  Take 1 tablet by mouth daily.    [provider]  omeprazole (PRILOSEC) 20 MG capsule Take 20 mg by mouth 2 (two) times daily. 11/09/21   [provider]  Potassium Gluconate 2 MEQ TABS Take 595 mg by mouth 3 (three) times a week.     [provider]  pregabalin (LYRICA) 75 MG capsule Take 75 mg by mouth 2 (two) times daily. 10/12/21   [provider]  PROVENTIL HFA 108 (90 Base) MCG/ACT inhaler INHALE 2 PUFFS INTO THE LUNGS EVERY FOUR HOURS AS NEEDED FOR WHEEZING. 06/16/16   Claretta Fraise, MD  SYMBICORT 160-4.5 MCG/ACT inhaler Inhale 2 puffs into the lungs 2 (two) times daily. 10/08/21   [provider]      Allergies    Codeine, Hydrocodone-acetaminophen, Meloxicam, and Montelukast    Review of Systems   Review of Systems  Constitutional:  Negative for fever.  Skin:  Positive for color change and wound.  All other systems reviewed and are negative.   Physical Exam Updated Vital Signs BP (!) 162/76   Pulse 85   Temp 97.7 F (36.5 C) (Oral)   Resp 18   Ht 1.575 m ('5\' 2"'$ )   Wt 72.6 kg   SpO2 93%   BMI 29.26 kg/m  Physical Exam Vitals and nursing note reviewed.  Constitutional:      Appearance: She is well-developed.  HENT:     Head: Normocephalic and atraumatic.  Eyes:  Pupils: Pupils are equal, round, and reactive to light.  Cardiovascular:     Rate and Rhythm: Normal rate and regular rhythm.  Pulmonary:     Effort: Pulmonary effort is normal. No respiratory distress.  Abdominal:     Palpations: Abdomen is soft.  Musculoskeletal:     Cervical back: Neck supple.     Comments: Focused examination of the left lower extremity reveals a 7 cm gaping laceration over the lateral left lower extremity, surrounding erythema and warmth noted, no fluctuance, no drainage  Skin:    General: Skin is warm and dry.  Neurological:     Mental Status: She is alert and oriented to person, place, and time.  Psychiatric:        Mood and Affect:  Mood normal.     ED Results / Procedures / Treatments   Labs (all labs ordered are listed, but only abnormal results are displayed) Labs Reviewed  CBC WITH DIFFERENTIAL/PLATELET - Abnormal; Notable for the following components:      Result Value   RDW 16.9 (*)    Platelets 91 (*)    Lymphs Abs 0.6 (*)    All other components within normal limits  BASIC METABOLIC PANEL - Abnormal; Notable for the following components:   Glucose, Bld 136 (*)    All other components within normal limits    EKG None  Radiology No results found.  Procedures Procedures    Medications Ordered in ED Medications  Tdap (BOOSTRIX) injection 0.5 mL (0.5 mLs Intramuscular Given 02/25/22 0153)  doxycycline (VIBRA-TABS) tablet 100 mg (100 mg Oral Given 02/25/22 0153)    ED Course/ Medical Decision Making/ A&P                           Medical Decision Making Amount and/or Complexity of Data Reviewed Labs: ordered.  Risk Prescription drug management.   This patient presents to the ED for concern of wound infection, this involves an extensive number of treatment options, and is a complaint that carries with it a high risk of complications and morbidity.  I considered the following differential and admission for this acute, potentially life threatening condition.  The differential diagnosis includes cellulitis, wound infection, deep space infection, local reaction to neomycin  MDM:    This is a 66 year old female who presents with wound infection.  She is nontoxic and vital signs are reassuring.  She is afebrile.  She does appear to have an acutely infected wound.  This is not amenable to closure given she is several days out from the initial insult.  Some of the erythema may be related to reaction to neomycin.  Basic labs obtained given her age.  No leukocytosis.  No significant metabolic derangements.  She was given a dose of doxycycline.  Recommend that she stop using neomycin and use bacitracin for  topical antibiotic.  Additionally she will be given wound care instructions and oral doxycycline.  (Labs, imaging, consults)  Labs: I Ordered, and personally interpreted labs.  The pertinent results include: CBC, BMP  Imaging Studies ordered: I ordered imaging studies including none I independently visualized and interpreted imaging. I agree with the radiologist interpretation  Additional history obtained from chart review.  External records from outside source obtained and reviewed including prior evaluations  Cardiac Monitoring: The patient was maintained on a cardiac monitor.  I personally viewed and interpreted the cardiac monitored which showed an underlying rhythm of: Normal sinus rhythm  Reevaluation:  After the interventions noted above, I reevaluated the patient and found that they have :stayed the same  Social Determinants of Health: Lives independently  Disposition: Discharge  Co morbidities that complicate the patient evaluation  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    Dispense:  20 capsule    Refill:  0    I have reviewed the patients home medicines and have made adjustments as needed  Problem List / ED Course: Problem List Items Addressed This Visit       Other   Cellulitis - Primary   Other  Visit Diagnoses     Wound infection                       Final Clinical Impression(s) / ED Diagnoses Final diagnoses:  Cellulitis of left lower extremity  Wound infection    Rx / DC Orders ED Discharge Orders          Ordered    doxycycline (VIBRAMYCIN) 100 MG capsule  2 times daily        02/25/22 0259              Merryl Hacker, MD 02/25/22 [phone]

## 2022-03-02 ENCOUNTER — Emergency Department (HOSPITAL_COMMUNITY)
Admission: EM | Admit: 2022-03-02 | Discharge: 2022-03-02 | Disposition: A | Discharge status: Home / Self Care | Payer: Medicaid Other | Attending: Emergency Medicine | Admitting: Emergency Medicine

## 2022-03-02 ENCOUNTER — Encounter (HOSPITAL_COMMUNITY): Payer: Self-pay

## 2022-03-02 ENCOUNTER — Other Ambulatory Visit: Payer: Self-pay

## 2022-03-02 ENCOUNTER — Emergency Department (HOSPITAL_COMMUNITY): Payer: Medicaid Other

## 2022-03-02 DIAGNOSIS — E7220 Disorder of urea cycle metabolism, unspecified: Secondary | ICD-10-CM | POA: Insufficient documentation

## 2022-03-02 DIAGNOSIS — R400 Somnolence: Secondary | ICD-10-CM | POA: Diagnosis not present

## 2022-03-02 DIAGNOSIS — R0602 Shortness of breath: Secondary | ICD-10-CM | POA: Diagnosis present

## 2022-03-02 LAB — COMPREHENSIVE METABOLIC PANEL
ALT: 32 U/L (ref 0–44)
AST: 47 U/L — ABNORMAL HIGH (ref 15–41)
Albumin: 3.9 g/dL (ref 3.5–5.0)
Alkaline Phosphatase: 84 U/L (ref 38–126)
Anion gap: 6 (ref 5–15)
BUN: 12 mg/dL (ref 8–23)
CO2: 23 mmol/L (ref 22–32)
Calcium: 9 mg/dL (ref 8.9–10.3)
Chloride: 106 mmol/L (ref 98–111)
Creatinine, Ser: 0.77 mg/dL (ref 0.44–1.00)
GFR, Estimated: >60 mL/min (ref >60)
Glucose, Bld: 124 mg/dL — ABNORMAL HIGH (ref 70–99)
Potassium: 3.5 mmol/L (ref 3.5–5.1)
Sodium: 135 mmol/L (ref 135–145)
Total Bilirubin: 1.5 mg/dL — ABNORMAL HIGH (ref 0.3–1.2)
Total Protein: 8.1 g/dL (ref 6.5–8.1)

## 2022-03-02 LAB — CBC WITH DIFFERENTIAL/PLATELET
Abs Immature Granulocytes: 0.04 10*3/uL (ref 0.00–0.07)
Basophils Absolute: 0.1 10*3/uL (ref 0.0–0.1)
Basophils Relative: 1 %
Eosinophils Absolute: 0.4 10*3/uL (ref 0.0–0.5)
Eosinophils Relative: 5 %
HCT: 47.8 % — ABNORMAL HIGH (ref 36.0–46.0)
Hemoglobin: 15.2 g/dL — ABNORMAL HIGH (ref 12.0–15.0)
Immature Granulocytes: 1 %
Lymphocytes Relative: 32 %
Lymphs Abs: 2.7 10*3/uL (ref 0.7–4.0)
MCH: 26.4 pg (ref 26.0–34.0)
MCHC: 31.8 g/dL (ref 30.0–36.0)
MCV: 83 fL (ref 80.0–100.0)
Monocytes Absolute: 1 10*3/uL (ref 0.1–1.0)
Monocytes Relative: 12 %
Neutro Abs: 4.2 10*3/uL (ref 1.7–7.7)
Neutrophils Relative %: 49 %
Platelets: 153 10*3/uL (ref 150–400)
RBC: 5.76 MIL/uL — ABNORMAL HIGH (ref 3.87–5.11)
RDW: 17.4 % — ABNORMAL HIGH (ref 11.5–15.5)
WBC: 8.4 10*3/uL (ref 4.0–10.5)
nRBC: 0 % (ref 0.0–0.2)

## 2022-03-02 LAB — ETHANOL: Alcohol, Ethyl (B): <10 mg/dL (ref <10)

## 2022-03-02 LAB — AMMONIA: Ammonia: 140 umol/L — ABNORMAL HIGH (ref 9–35)

## 2022-03-02 MED ORDER — LACTULOSE 10 GM/15ML PO SOLN
20.0000 g | Freq: Once | ORAL | Status: AC
Start: 2022-03-02 — End: 2022-03-02
  Administered 2022-03-02: 20 g via ORAL
  Filled 2022-03-02: qty 30

## 2022-03-02 NOTE — ED Notes (Signed)
ED Provider at bedside. 

## 2022-03-02 NOTE — Discharge Instructions (Signed)
Make sure you take your lactulose and all the medicines as prescribed to you.  Do not take any medicines or drugs that are not prescribed to you.  Follow-up with your doctor if any problems

## 2022-03-02 NOTE — ED Notes (Signed)
Pt reports using Heroin prior to EMS arrival. Pt denies ETOH use today.

## 2022-03-02 NOTE — ED Notes (Signed)
Pt returned from Radiology.

## 2022-03-02 NOTE — ED Notes (Signed)
Pt placed on 2L Nasal Cannula. Pts O2 Sats dropped to 86% RA while resting.

## 2022-03-02 NOTE — ED Provider Notes (Signed)
[facility] EMERGENCY DEPARTMENT Provider Note   CSN: [id number] Arrival date & time: 03/02/22  1951     History {Add pertinent medical, surgical, social history, OB history to HPI:1} Chief Complaint  Patient presents with   Shortness of Breath    Kristen Marsh is a 66 y.o. female.  Patient was brought in lethargic.  She has a history of cirrhosis.  It was found out later patient had do some heroin   Shortness of Breath      Home Medications Prior to Admission medications   Medication Sig Start Date End Date Taking? Authorizing Provider  albuterol (PROVENTIL) (2.5 MG/3ML) 0.083% nebulizer solution Take 3 mLs (2.5 mg total) by nebulization every 6 (six) hours as needed for wheezing or shortness of breath. 07/09/15   Sharion Balloon, FNP  camphor-menthol [facility]) lotion Apply topically as needed for itching. 01/09/18   Manuella Ghazi, Pratik D, DO  cyclobenzaprine (FLEXERIL) 5 MG tablet TAKE ONE TABLET BY MOUTH 3 TIMES DAILY AS NEEDED FOR MUSCLE SPASM(S). 01/05/18   Barton Dubois, MD  diphenhydrAMINE (BENADRYL) 25 MG tablet Take 50 mg by mouth every 6 (six) hours as needed.    [provider]  doxycycline (VIBRAMYCIN) 100 MG capsule Take 1 capsule (100 mg total) by mouth 2 (two) times daily. 02/25/22   Horton, Barbette Hair, MD  ibuprofen (ADVIL,MOTRIN) 600 MG tablet Take 1 tablet (600 mg total) by mouth every 8 (eight) hours as needed for moderate pain. 01/05/18   Barton Dubois, MD  ipratropium-albuterol (DUONEB) 0.5-2.5 (3) MG/3ML SOLN SMARTSIG:1 Vial(s) Via Nebulizer Every 6-8 Hours PRN 11/11/21   [provider]  Multiple Vitamin (MULTIVITAMIN) tablet Take 1 tablet by mouth daily.    [provider]  omeprazole (PRILOSEC) 20 MG capsule Take 20 mg by mouth 2 (two) times daily. 11/09/21   [provider]  Potassium Gluconate 2 MEQ TABS Take 595 mg by mouth 3 (three) times a week.     [provider]  pregabalin (LYRICA) 75 MG capsule Take 75 mg by mouth 2  (two) times daily. 10/12/21   [provider]  PROVENTIL HFA 108 (90 Base) MCG/ACT inhaler INHALE 2 PUFFS INTO THE LUNGS EVERY FOUR HOURS AS NEEDED FOR WHEEZING. 06/16/16   Claretta Fraise, MD  SYMBICORT 160-4.5 MCG/ACT inhaler Inhale 2 puffs into the lungs 2 (two) times daily. 10/08/21   [provider]      Allergies    Codeine, Hydrocodone-acetaminophen, Meloxicam, and Montelukast    Review of Systems   Review of Systems  Respiratory:  Positive for shortness of breath.     Physical Exam Updated Vital Signs BP 122/84   Pulse 94   Temp 98.4 F (36.9 C) (Oral)   Resp 16   Ht '5\' 2"'$  (1.575 m)   Wt 72.5 kg   SpO2 96%   BMI 29.23 kg/m  Physical Exam  ED Results / Procedures / Treatments   Labs (all labs ordered are listed, but only abnormal results are displayed) Labs Reviewed  CBC WITH DIFFERENTIAL/PLATELET - Abnormal; Notable for the following components:      Result Value   RBC 5.76 (*)    Hemoglobin 15.2 (*)    HCT 47.8 (*)    RDW 17.4 (*)    All other components within normal limits  COMPREHENSIVE METABOLIC PANEL - Abnormal; Notable for the following components:   Glucose, Bld 124 (*)    AST 47 (*)    Total Bilirubin 1.5 (*)  All other components within normal limits  AMMONIA - Abnormal; Notable for the following components:   Ammonia 140 (*)    All other components within normal limits  ETHANOL  RAPID URINE DRUG SCREEN, HOSP PERFORMED    EKG None  Radiology CT Head Wo Contrast  Result Date: 03/02/2022 CLINICAL DATA:  Dizziness EXAM: CT HEAD WITHOUT CONTRAST TECHNIQUE: Contiguous axial images were obtained from the base of the skull through the vertex without intravenous contrast. RADIATION DOSE REDUCTION: This exam was performed according to the departmental dose-optimization program which includes automated exposure control, adjustment of the mA and/or kV according to patient size and/or use of iterative reconstruction technique. COMPARISON:   None Available. FINDINGS: Brain: No evidence of acute infarction, hemorrhage, hydrocephalus, extra-axial collection or mass lesion/mass effect. Mild atrophic changes are noted. 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 Mild chronic white matter ischemic changes are seen as well. Vascular: No hyperdense vessel or unexpected calcification. Skull: Normal. Negative for fracture or focal lesion. Sinuses/Orbits: No acute finding. Other: None. IMPRESSION: Chronic changes without acute abnormality Electronically Signed   By: Inez Catalina M.D.   On: 03/02/2022 20:49   DG Chest Port 1 View  Result Date: 03/02/2022 CLINICAL DATA:  Shortness of breath. EXAM: PORTABLE CHEST 1 VIEW COMPARISON:  03/04/2021 FINDINGS: Cardiac enlargement. Mild peripheral interstitial change may represent interstitial fibrosis or edema. No focal consolidation. No pleural effusions. No pneumothorax. Mediastinal contours appear intact. Probable emphysematous changes in the upper lungs. IMPRESSION: Cardiac enlargement. Mild interstitial pattern to the lung bases may represent edema or fibrosis. Electronically Signed   By: Lucienne Capers M.D.   On: 03/02/2022 20:46    Procedures Procedures  {Document cardiac monitor, telemetry assessment procedure when appropriate:1}  Medications Ordered in ED Medications  lactulose (CHRONULAC) 10 GM/15ML solution 20 g (has no administration in time range)    ED Course/ Medical Decision Making/ A&P                           Medical Decision Making Amount and/or Complexity of Data Reviewed Labs: ordered. Radiology: ordered. ECG/medicine tests: ordered.  Risk Prescription drug management.  Patient with lethargy from heroin use.  She is alert and oriented x4 we discharged home  {Document critical care time when appropriate:1} {Document review of labs and clinical decision tools ie heart score, Chads2Vasc2 etc:1}  {Document your independent review of radiology images, and  any outside records:1} {Document your discussion with family members, caretakers, and with consultants:1} {Document social determinants of health affecting pt's care:1} {Document your decision making why or why not admission, treatments were needed:1} Final Clinical Impression(s) / ED Diagnoses Final diagnoses:  Somnolence    Rx / DC Orders ED Discharge Orders     None

## 2022-03-02 NOTE — ED Triage Notes (Signed)
Pt arrived from home via [facility] who report family called due to Pt experiencing lethargy, SOB and Hx of polysubstance/ETOH abuse. Pt presents with audible wheezing and EMS report Pt has not had an ETOH drink in 3 days.

## 2022-08-14 ENCOUNTER — Ambulatory Visit (HOSPITAL_COMMUNITY)
Admission: RE | Admit: 2022-08-14 | Discharge: 2022-08-14 | Disposition: A | Discharge status: Home / Self Care | Payer: Medicare HMO | Source: Ambulatory Visit | Attending: Adult Health | Admitting: Adult Health

## 2022-08-14 ENCOUNTER — Other Ambulatory Visit (HOSPITAL_COMMUNITY): Payer: Self-pay | Admitting: Adult Health

## 2022-08-14 DIAGNOSIS — R5383 Other fatigue: Secondary | ICD-10-CM | POA: Diagnosis not present

## 2022-08-14 DIAGNOSIS — S8991XA Unspecified injury of right lower leg, initial encounter: Secondary | ICD-10-CM

## 2022-08-14 DIAGNOSIS — M25562 Pain in left knee: Secondary | ICD-10-CM | POA: Diagnosis not present

## 2022-08-14 DIAGNOSIS — M79605 Pain in left leg: Secondary | ICD-10-CM | POA: Insufficient documentation

## 2022-08-14 DIAGNOSIS — R010 Benign and innocent cardiac murmurs: Secondary | ICD-10-CM | POA: Diagnosis not present

## 2022-08-14 DIAGNOSIS — L03115 Cellulitis of right lower limb: Secondary | ICD-10-CM | POA: Diagnosis not present

## 2022-08-14 DIAGNOSIS — L03116 Cellulitis of left lower limb: Secondary | ICD-10-CM | POA: Diagnosis not present

## 2022-08-14 DIAGNOSIS — K7460 Unspecified cirrhosis of liver: Secondary | ICD-10-CM | POA: Diagnosis not present

## 2022-08-14 DIAGNOSIS — G6289 Other specified polyneuropathies: Secondary | ICD-10-CM | POA: Diagnosis not present

## 2022-08-14 DIAGNOSIS — Z9181 History of falling: Secondary | ICD-10-CM | POA: Diagnosis not present

## 2022-08-27 ENCOUNTER — Other Ambulatory Visit (HOSPITAL_COMMUNITY): Payer: Self-pay | Admitting: Adult Health

## 2022-08-27 DIAGNOSIS — R011 Cardiac murmur, unspecified: Secondary | ICD-10-CM

## 2022-08-28 DIAGNOSIS — R600 Localized edema: Secondary | ICD-10-CM | POA: Diagnosis not present

## 2022-08-28 DIAGNOSIS — L03115 Cellulitis of right lower limb: Secondary | ICD-10-CM | POA: Diagnosis not present

## 2022-08-28 DIAGNOSIS — R010 Benign and innocent cardiac murmurs: Secondary | ICD-10-CM | POA: Diagnosis not present

## 2022-08-28 DIAGNOSIS — L03116 Cellulitis of left lower limb: Secondary | ICD-10-CM | POA: Diagnosis not present

## 2022-09-22 ENCOUNTER — Encounter (HOSPITAL_COMMUNITY): Payer: Self-pay

## 2022-09-22 ENCOUNTER — Ambulatory Visit (HOSPITAL_COMMUNITY): Admission: RE | Admit: 2022-09-22 | Payer: Medicare HMO | Source: Ambulatory Visit

## 2022-10-02 ENCOUNTER — Emergency Department (HOSPITAL_COMMUNITY): Payer: Medicare HMO

## 2022-10-02 ENCOUNTER — Emergency Department (HOSPITAL_COMMUNITY)
Admission: EM | Admit: 2022-10-02 | Discharge: 2022-10-02 | Disposition: A | Discharge status: Home / Self Care | Payer: Medicare HMO | Attending: Emergency Medicine | Admitting: Emergency Medicine

## 2022-10-02 ENCOUNTER — Other Ambulatory Visit: Payer: Self-pay

## 2022-10-02 DIAGNOSIS — S8990XA Unspecified injury of unspecified lower leg, initial encounter: Secondary | ICD-10-CM | POA: Diagnosis not present

## 2022-10-02 DIAGNOSIS — X58XXXA Exposure to other specified factors, initial encounter: Secondary | ICD-10-CM | POA: Diagnosis not present

## 2022-10-02 MED ORDER — OXYCODONE-ACETAMINOPHEN 5-325 MG PO TABS
1.0000 | ORAL_TABLET | Freq: Once | ORAL | Status: AC
  Administered 2022-10-02: 1 via ORAL
  Filled 2022-10-02: qty 1

## 2022-10-02 MED ORDER — OXYCODONE-ACETAMINOPHEN 5-325 MG PO TABS: 1.0000 | ORAL_TABLET | Freq: Four times a day (QID) | ORAL | 0 refills | Status: DC | PRN

## 2022-10-02 NOTE — ED Provider Notes (Signed)
Patient suffers from a right patella fracture which impairs her ability to perform daily activities like ambulating in the home.   a walker will not resolve the issue with performing activities of daily living.  A wheelchair will allow patient to safely perform daily activities.  patient can propel the wheelchair in the home or have a caregiver who can provide assistance.   Milton Ferguson, MD 10/02/22 1109

## 2022-10-02 NOTE — ED Notes (Signed)
Assisted pt in restroom

## 2022-10-02 NOTE — ED Notes (Cosign Needed)
Patient suffers from patella which impairs their ability to perform daily activities like ambulating in  the home. A walker will not resolve issue with performing activities of daily living. A wheelchair  will allow patient to safely perform daily activities. Patient can safely propel the wheelchair in the  home or has a caregiver who can provide assistance.

## 2022-10-02 NOTE — ED Triage Notes (Signed)
Pt fell around midnight going up steps at a hotel and fell on her bilateral knees. Pt unable to put weight on right knee.

## 2022-10-02 NOTE — ED Provider Notes (Signed)
[facility] Provider Note   CSN: [id number] Arrival date & time: 10/02/22  0700     History  Chief Complaint  Patient presents with   Knee Injury    Kristen Marsh is a 67 y.o. female.  Patient has a history of COPD.  She fell and hurt her right knee.  The history is provided by the patient and medical records. No language interpreter was used.  Fall This is a new problem. The current episode started 12 to 24 hours ago. The problem occurs constantly. The problem has not changed since onset.Pertinent negatives include no chest pain, no abdominal pain and no shortness of breath. Nothing aggravates the symptoms. Nothing relieves the symptoms. She has tried nothing for the symptoms. The treatment provided no relief.       Home Medications Prior to Admission medications   Medication Sig Start Date End Date Taking? Authorizing Provider  oxyCODONE-acetaminophen (PERCOCET/ROXICET) 5-325 MG tablet Take 1 tablet by mouth every 6 (six) hours as needed for severe pain. 10/02/22  Yes Milton Ferguson, MD  albuterol (PROVENTIL) (2.5 MG/3ML) 0.083% nebulizer solution Take 3 mLs (2.5 mg total) by nebulization every 6 (six) hours as needed for wheezing or shortness of breath. 07/09/15   Sharion Balloon, FNP  camphor-menthol [facility]) lotion Apply topically as needed for itching. 01/09/18   Manuella Ghazi, Pratik D, DO  cyclobenzaprine (FLEXERIL) 5 MG tablet TAKE ONE TABLET BY MOUTH 3 TIMES DAILY AS NEEDED FOR MUSCLE SPASM(S). 01/05/18   Barton Dubois, MD  diphenhydrAMINE (BENADRYL) 25 MG tablet Take 50 mg by mouth every 6 (six) hours as needed.    [provider]  doxycycline (VIBRAMYCIN) 100 MG capsule Take 1 capsule (100 mg total) by mouth 2 (two) times daily. 02/25/22   Horton, Barbette Hair, MD  ibuprofen (ADVIL,MOTRIN) 600 MG tablet Take 1 tablet (600 mg total) by mouth every 8 (eight) hours as needed for moderate pain. 01/05/18   Barton Dubois, MD   ipratropium-albuterol (DUONEB) 0.5-2.5 (3) MG/3ML SOLN SMARTSIG:1 Vial(s) Via Nebulizer Every 6-8 Hours PRN 11/11/21   [provider]  Multiple Vitamin (MULTIVITAMIN) tablet Take 1 tablet by mouth daily.    [provider]  omeprazole (PRILOSEC) 20 MG capsule Take 20 mg by mouth 2 (two) times daily. 11/09/21   [provider]  Potassium Gluconate 2 MEQ TABS Take 595 mg by mouth 3 (three) times a week.     [provider]  pregabalin (LYRICA) 75 MG capsule Take 75 mg by mouth 2 (two) times daily. 10/12/21   [provider]  PROVENTIL HFA 108 (90 Base) MCG/ACT inhaler INHALE 2 PUFFS INTO THE LUNGS EVERY FOUR HOURS AS NEEDED FOR WHEEZING. 06/16/16   Claretta Fraise, MD  SYMBICORT 160-4.5 MCG/ACT inhaler Inhale 2 puffs into the lungs 2 (two) times daily. 10/08/21   [provider]      Allergies    Codeine, Hydrocodone-acetaminophen, Meloxicam, and Montelukast    Review of Systems   Review of Systems  Constitutional:  Negative for chills and fever.  HENT:  Negative for ear pain and sore throat.   Eyes:  Negative for pain and visual disturbance.  Respiratory:  Negative for cough and shortness of breath.   Cardiovascular:  Negative for chest pain and palpitations.  Gastrointestinal:  Negative for abdominal pain and vomiting.  Genitourinary:  Negative for dysuria and hematuria.  Musculoskeletal:  Negative for arthralgias and back pain.       Tender swollen  right knee  Skin:  Negative for color change and rash.  Neurological:  Negative for seizures and syncope.  All other systems reviewed and are negative.   Physical Exam Updated Vital Signs BP (!) 173/78   Pulse (!) 112   Temp 97.9 F (36.6 C) (Oral)   Resp 19   Ht '5\' 2"'$  (1.575 m)   Wt 68 kg   SpO2 100%   BMI 27.44 kg/m  Physical Exam Vitals and nursing note reviewed.  Constitutional:      Appearance: She is well-developed.  HENT:     Head: Normocephalic.     Nose: Nose normal.   Eyes:     General: No scleral icterus.    Conjunctiva/sclera: Conjunctivae normal.  Neck:     Thyroid: No thyromegaly.  Cardiovascular:     Rate and Rhythm: Normal rate and regular rhythm.     Heart sounds: No murmur heard.    No friction rub. No gallop.  Pulmonary:     Breath sounds: No stridor. No wheezing or rales.  Chest:     Chest wall: No tenderness.  Abdominal:     General: There is no distension.     Tenderness: There is no abdominal tenderness. There is no rebound.  Musculoskeletal:        General: Normal range of motion.     Cervical back: Neck supple.     Comments: Swelling to right knee with tenderness  Lymphadenopathy:     Cervical: No cervical adenopathy.  Skin:    Findings: No erythema or rash.  Neurological:     Mental Status: She is alert and oriented to person, place, and time.     Motor: No abnormal muscle tone.     Coordination: Coordination normal.  Psychiatric:        Behavior: Behavior normal.     ED Results / Procedures / Treatments   Labs (all labs ordered are listed, but only abnormal results are displayed) Labs Reviewed - No data to display  EKG None  Radiology DG Knee Right Port  Result Date: 10/02/2022 CLINICAL DATA:  Pain EXAM: PORTABLE RIGHT KNEE - 1-2 VIEW; PORTABLE LEFT KNEE - 1-2 VIEW COMPARISON:  Radiograph 08/14/2022 FINDINGS: Right knee: There is a nondisplaced fracture of the patella. Moderate-sized joint effusion. Anterior soft tissue swelling. There is mild medial compartment degenerative change. Left knee: There is no evidence of acute fracture. There is mild medial compartment degenerative change. Trace joint effusion. Mild soft tissue swelling anteriorly. IMPRESSION: Right knee: Nondisplaced fracture of the patella. Adjacent prepatellar soft tissue swelling. Moderate-sized joint effusion. Left knee: No evidence of acute fracture. Trace joint effusion. Mild soft tissue swelling anteriorly. Electronically Signed   By: Maurine Simmering  M.D.   On: 10/02/2022 08:03   DG Knee Left Port  Result Date: 10/02/2022 CLINICAL DATA:  Pain EXAM: PORTABLE RIGHT KNEE - 1-2 VIEW; PORTABLE LEFT KNEE - 1-2 VIEW COMPARISON:  Radiograph 08/14/2022 FINDINGS: Right knee: There is a nondisplaced fracture of the patella. Moderate-sized joint effusion. Anterior soft tissue swelling. There is mild medial compartment degenerative change. Left knee: There is no evidence of acute fracture. There is mild medial compartment degenerative change. Trace joint effusion. Mild soft tissue swelling anteriorly. IMPRESSION: Right knee: Nondisplaced fracture of the patella. Adjacent prepatellar soft tissue swelling. Moderate-sized joint effusion. Left knee: No evidence of acute fracture. Trace joint effusion. Mild soft tissue swelling anteriorly. Electronically Signed   By: Maurine Simmering M.D.   On: 10/02/2022 08:03  Procedures Procedures    Medications Ordered in ED Medications  oxyCODONE-acetaminophen (PERCOCET/ROXICET) 5-325 MG per tablet 1 tablet (1 tablet Oral Given 10/02/22 1030)    ED Course/ Medical Decision Making/ A&P                             Medical Decision Making Amount and/or Complexity of Data Reviewed Radiology: ordered.  Risk Prescription drug management.  This patient presents to the ED for concern of fall, this involves an extensive number of treatment options, and is a complaint that carries with it a high risk of complications and morbidity.  The differential diagnosis includes the injury    Co morbidities that complicate the patient evaluation  COPD   Additional history obtained:  Additional history obtained from patient External records from outside source obtained and reviewed including hospital records   Lab Tests:  No labs  Imaging Studies ordered:  I ordered imaging studies including x-rays of right and left knee I independently visualized and interpreted imaging which showed patella fracture right knee I agree  with the radiologist interpretation   Cardiac Monitoring: / EKG:  The patient was maintained on a cardiac monitor.  I personally viewed and interpreted the cardiac monitored which showed an underlying rhythm of: Normal sinus rhythm   Consultations Obtained:  No consultant  Problem List / ED Course / Critical interventions / Medication management  COPD kidney injury I ordered medication including Percocet for pain Reevaluation of the patient after these medicines showed that the patient improved I have reviewed the patients home medicines and have made adjustments as needed   Social Determinants of Health:  None   Test / Admission - Considered:  None  Patient with a nondisplaced patella fracture on the right.  She is given a wheelchair to help with ambulation and prescription for pain medicine and she will follow-up with orthopedics        Final Clinical Impression(s) / ED Diagnoses Final diagnoses:  Knee injury, unspecified laterality, initial encounter    Rx / DC Orders ED Discharge Orders          Ordered    oxyCODONE-acetaminophen (PERCOCET/ROXICET) 5-325 MG tablet  Every 6 hours PRN        10/02/22 1346              Milton Ferguson, MD 10/04/22 0820

## 2022-10-02 NOTE — Discharge Instructions (Addendum)
Follow-up with Dr. Aline Brochure or one of his colleagues next week

## 2022-10-02 NOTE — ED Notes (Signed)
CSW consulted for [facility] needs. CSW spoke with pt who states she is staying at the [facility] while she is in the process of moving. Pt is agreeable to [facility] and does not have an agency preference. CSW spoke to Judson Roch with [facility] who accepts [facility] PT/OT referral. MD placed [facility] orders. Pt also states she needs a wheelchair ordered. MD placed orders and narrative. CSW spoke to [place] who will work on getting wheelchair ordered and delivered.

## 2022-10-17 ENCOUNTER — Encounter: Payer: Self-pay | Admitting: Orthopedic Surgery

## 2022-10-17 ENCOUNTER — Ambulatory Visit (INDEPENDENT_AMBULATORY_CARE_PROVIDER_SITE_OTHER): Payer: Medicare HMO | Admitting: Orthopedic Surgery

## 2022-10-17 ENCOUNTER — Other Ambulatory Visit (INDEPENDENT_AMBULATORY_CARE_PROVIDER_SITE_OTHER): Payer: Medicare HMO

## 2022-10-17 VITALS — Ht 62.0 in | Wt 160.0 lb

## 2022-10-17 DIAGNOSIS — S82091A Other fracture of right patella, initial encounter for closed fracture: Secondary | ICD-10-CM

## 2022-10-17 DIAGNOSIS — S82034A Nondisplaced transverse fracture of right patella, initial encounter for closed fracture: Secondary | ICD-10-CM

## 2022-10-17 MED ORDER — OXYCODONE HCL 5 MG PO TABS: 5.0000 mg | ORAL_TABLET | Freq: Four times a day (QID) | ORAL | 0 refills | Status: AC | PRN

## 2022-10-17 NOTE — Patient Instructions (Signed)
Brace on at all time  No weight bearing  DO not bend your knee  Medications as needed   Follow up in 2-3 weeks

## 2022-10-18 NOTE — Progress Notes (Signed)
New Patient Visit  Assessment: Kristen Marsh is a 67 y.o. female with the following: 1. closed fracture of right patella, initial encounter  Plan: Kristen Marsh fell and sustained a minimally displaced, transverse fracture of the right patella.  She is able to maintain a straight leg raise.  She is to remain nonweightbearing.  Knee immobilizer at all times.  Updated prescription for pain medication was provided.  I will see her back in 2-3 weeks.  Follow-up: Return for 2-3 weeks.  Subjective:  Chief Complaint  Patient presents with   Knee Pain    Right patella fracture, DOI 10/01/22    History of Present Illness: Kristen Marsh is a 67 y.o. female who presents for evaluation of right knee pain.  Approximate 2 weeks ago, she fell, landed directly on the right knee.  She had immediate pain.  She was evaluated in the emergency department.  She was diagnosed with a transverse patella fracture.  She was fitted for a knee immobilizer.  She states she has been wearing the knee immobilizer.  She has been taking medications for pain.  She has been bearing some weight on her right leg.   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 per pt    Past Surgical History:  Procedure Laterality Date   ABDOMINAL HYSTERECTOMY     APPENDECTOMY     COLONOSCOPY W/ BIOPSIES AND POLYPECTOMY     DIAGNOSTIC LAPAROSCOPY      exploratory lap   DIRECT LARYNGOSCOPY N/A 03/22/2014   Procedure: DIRECT LARYNGOSCOPY;  Surgeon: Ruby Cola, MD;  Location: [place];  Service: ENT;  Laterality: N/A;  With biopsy   DIRECT LARYNGOSCOPY N/A 01/15/2015   Procedure: DIRECT LARYNGOSCOPY WITH BIOPSY;  Surgeon: Leta Baptist, MD;  Location: [place];  Service: ENT;  Laterality: N/A;   ESOPHAGOGASTRODUODENOSCOPY (EGD) WITH PROPOFOL N/A 07/26/2014   Procedure: ESOPHAGOGASTRODUODENOSCOPY (EGD) WITH PROPOFOL;  Surgeon: Rogene Houston, MD;  Location: [facility];  Service: Endoscopy;  Laterality: N/A;   HYSTEROSCOPY     INCISION AND DRAINAGE Right 01/07/2018   Procedure: INCISION AND DRAINAGE OF ABSCESS RIGHT FOOT;  Surgeon: Caprice Beaver, DPM;  Location: [facility];  Service: Podiatry;  Laterality: Right;    Family History  Problem Relation Age of Onset   Cancer Mother        esophageal   Cancer Brother        Liver cancer and cirrhosis age 77   Social History   Tobacco Use   Smoking status: Every Day    Packs/day: 0.50    Years: 40.00    Additional pack years: 0.00    Total pack years: 20.00    Types: Cigarettes  Smokeless tobacco: Never   Tobacco comments:    1/2 pack a day at least 40 yrs  Vaping Use   Vaping Use: Never used  Substance Use Topics   Alcohol use: Yes    Alcohol/week: 4.0 standard drinks of alcohol    Types: 4 Cans of beer per week    Comment: 1 smirnoff a day.    Drug use: Yes    Types: Cocaine, "Crack" cocaine    Comment: Pt reports using Heroin    Allergies  Allergen Reactions   Codeine Itching   Hydrocodone-Acetaminophen Itching   Meloxicam Swelling    Face swelling   Montelukast Cough    Current Meds  Medication Sig   albuterol (PROVENTIL) (2.5 MG/3ML) 0.083% nebulizer solution Take 3 mLs (2.5 mg total) by nebulization every 6 (six) hours as needed for wheezing or shortness of breath.   camphor-menthol (SARNA) lotion Apply topically as needed for itching.   cyclobenzaprine  (FLEXERIL) 5 MG tablet TAKE ONE TABLET BY MOUTH 3 TIMES DAILY AS NEEDED FOR MUSCLE SPASM(S).   diphenhydrAMINE (BENADRYL) 25 MG tablet Take 50 mg by mouth every 6 (six) hours as needed.   doxycycline (VIBRAMYCIN) 100 MG capsule Take 1 capsule (100 mg total) by mouth 2 (two) times daily.   ibuprofen (ADVIL,MOTRIN) 600 MG tablet Take 1 tablet (600 mg total) by mouth every 8 (eight) hours as needed for moderate pain.   ipratropium-albuterol (DUONEB) 0.5-2.5 (3) MG/3ML SOLN SMARTSIG:1 Vial(s) Via Nebulizer Every 6-8 Hours PRN   Multiple Vitamin (MULTIVITAMIN) tablet Take 1 tablet by mouth daily.   omeprazole (PRILOSEC) 20 MG capsule Take 20 mg by mouth 2 (two) times daily.   oxyCODONE (ROXICODONE) 5 MG immediate release tablet Take 1 tablet (5 mg total) by mouth every 6 (six) hours as needed for up to 7 days.   Potassium Gluconate 2 MEQ TABS Take 595 mg by mouth 3 (three) times a week.    pregabalin (LYRICA) 75 MG capsule Take 75 mg by mouth 2 (two) times daily.   PROVENTIL HFA 108 (90 Base) MCG/ACT inhaler INHALE 2 PUFFS INTO THE LUNGS EVERY FOUR HOURS AS NEEDED FOR WHEEZING.   SYMBICORT 160-4.5 MCG/ACT inhaler Inhale 2 puffs into the lungs 2 (two) times daily.   [DISCONTINUED] oxyCODONE-acetaminophen (PERCOCET/ROXICET) 5-325 MG tablet Take 1 tablet by mouth every 6 (six) hours as needed for severe pain.    Objective: Ht 5\' 2"  (1.575 m)   Wt 160 lb (72.6 kg)   BMI 29.26 kg/m   Physical Exam:  General: Alert and oriented. and No acute distress. Gait: Unable to ambulate.  Right knee with mild swelling.  Bruising is appreciated.  She has tenderness to palpation directly over the patella.  She is able to get to full extension.  Upon release of the leg, she is able to maintain a straight leg raise, although this is painful.  Toes are warm and well-perfused.  Quadriceps and patellar tendons are intact.  IMAGING: I personally ordered and reviewed the following images  X-rays of the right knee  were obtained in clinic today.  There is a minimally displaced, transverse fracture of the patella.  Minimal arthritis is noted.  Swelling of the knee.  No bony lesions.  Impression: Transverse right patella fracture, minimally displaced.   New Medications:  Meds ordered this encounter  Medications   oxyCODONE (ROXICODONE) 5 MG immediate release tablet    Sig: Take 1 tablet (5 mg total) by mouth every 6 (six) hours as needed  for up to 7 days.    Dispense:  20 tablet    Refill:  0      Mordecai Rasmussen, MD  10/18/2022 10:21 PM

## 2022-10-27 ENCOUNTER — Encounter (HOSPITAL_COMMUNITY): Payer: Medicare HMO

## 2022-11-05 ENCOUNTER — Other Ambulatory Visit: Payer: Self-pay | Admitting: *Deleted

## 2022-11-05 DIAGNOSIS — M79604 Pain in right leg: Secondary | ICD-10-CM

## 2022-11-07 ENCOUNTER — Encounter: Payer: Medicare HMO | Admitting: Orthopedic Surgery

## 2022-11-11 NOTE — Progress Notes (Deleted)
[facility]           OF [place]  History and Physical   Kristen Seminoleeresa Hamstra is a 67 y.o. female who presents with ***  ***  The pt does *** have hx of previous venous procedures. The patient has *** history of DVT. Pt does *** history of varicose vein.   Pt does *** history of skin changes in lower legs.   There is *** family history of venous disorders.   The patient has *** used compression stockings in the past.    The pt is not on a statin for cholesterol management.  The pt is not on a daily aspirin.   Other AC:  none The pt is not on medication for hypertension.   The pt is not on medication for diabetes.   Tobacco hx:  ***  Pt does *** have family hx of AAA.  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 per pt    Past Surgical History:  Procedure Laterality Date   ABDOMINAL HYSTERECTOMY     APPENDECTOMY     COLONOSCOPY W/ BIOPSIES AND POLYPECTOMY     DIAGNOSTIC LAPAROSCOPY     exploratory lap   DIRECT LARYNGOSCOPY N/A 03/22/2014   Procedure: DIRECT LARYNGOSCOPY;  Surgeon: Melvenia BeamMitchell Gore, MD;  [facility];  Service: ENT;  Laterality: N/A;  With biopsy   DIRECT LARYNGOSCOPY N/A 01/15/2015   Procedure: DIRECT LARYNGOSCOPY WITH BIOPSY;  Surgeon: Newman PiesSu Teoh, MD;  Location: [facility];  Service: ENT;  Laterality: N/A;   ESOPHAGOGASTRODUODENOSCOPY (EGD) WITH PROPOFOL N/A 07/26/2014   Procedure: ESOPHAGOGASTRODUODENOSCOPY (EGD) WITH PROPOFOL;  Surgeon: Malissa HippoNajeeb U Rehman, MD;  Location: [facility];  Service: Endoscopy;  Laterality:  N/A;   HYSTEROSCOPY     INCISION AND DRAINAGE Right 01/07/2018   Procedure: INCISION AND DRAINAGE OF ABSCESS RIGHT FOOT;  Surgeon: Ferman HammingMcKinney, Benjamin, DPM;  Location: [facility];  Service: Podiatry;  Laterality: Right;    Social History   Socioeconomic History   Marital status: Divorced    Spouse name: Not on file   Number of children: Not on file   Years of education: Not on file   Highest education level: Not on file  Occupational History   Not on file  Tobacco Use   Smoking status: Every Day    Packs/day: 0.50    Years: 40.00    Additional pack years: 0.00    Total pack years: 20.00    Types: Cigarettes   Smokeless tobacco: Never   Tobacco comments:    1/2 pack a day at least 40 yrs  Vaping Use   Vaping Use: Never used  Substance and Sexual Activity   Alcohol use: Yes    Alcohol/week: 4.0 standard drinks of alcohol    Types: 4 Cans of beer per week    Comment: 1 smirnoff  a day.    Drug use: Yes    Types: Cocaine, "Crack" cocaine    Comment: Pt reports using Heroin   Sexual activity: Not Currently    Birth control/protection: Surgical  Other Topics Concern   Not on file  Social History Narrative   Not on file   Social Determinants of Health   Financial Resource Strain: Not on file  Food Insecurity: Not on file  Transportation Needs: Not on file  Physical Activity: Not on file  Stress: Not on file  Social Connections: Not on file  Intimate Partner Violence: Not on file    *** Family History  Problem Relation Age of Onset   Cancer Mother        esophageal   Cancer Brother        Liver cancer and cirrhosis age 27    Current Outpatient Medications  Medication Sig Dispense Refill   albuterol (PROVENTIL) (2.5 MG/3ML) 0.083% nebulizer solution Take 3 mLs (2.5 mg total) by nebulization every 6 (six) hours as needed for wheezing or shortness of breath. 75 mL 3   camphor-menthol (SARNA) lotion Apply topically as needed for itching. 222 mL 0   cyclobenzaprine  (FLEXERIL) 5 MG tablet TAKE ONE TABLET BY MOUTH 3 TIMES DAILY AS NEEDED FOR MUSCLE SPASM(S). 45 tablet 0   diphenhydrAMINE (BENADRYL) 25 MG tablet Take 50 mg by mouth every 6 (six) hours as needed.     doxycycline (VIBRAMYCIN) 100 MG capsule Take 1 capsule (100 mg total) by mouth 2 (two) times daily. 20 capsule 0   ibuprofen (ADVIL,MOTRIN) 600 MG tablet Take 1 tablet (600 mg total) by mouth every 8 (eight) hours as needed for moderate pain. 40 tablet 0   ipratropium-albuterol (DUONEB) 0.5-2.5 (3) MG/3ML SOLN SMARTSIG:1 Vial(s) Via Nebulizer Every 6-8 Hours PRN     Multiple Vitamin (MULTIVITAMIN) tablet Take 1 tablet by mouth daily.     omeprazole (PRILOSEC) 20 MG capsule Take 20 mg by mouth 2 (two) times daily.     Potassium Gluconate 2 MEQ TABS Take 595 mg by mouth 3 (three) times a week.      pregabalin (LYRICA) 75 MG capsule Take 75 mg by mouth 2 (two) times daily.     PROVENTIL HFA 108 (90 Base) MCG/ACT inhaler INHALE 2 PUFFS INTO THE LUNGS EVERY FOUR HOURS AS NEEDED FOR WHEEZING. 6.7 g 5   SYMBICORT 160-4.5 MCG/ACT inhaler Inhale 2 puffs into the lungs 2 (two) times daily.     No current facility-administered medications for this visit.    Allergies  Allergen Reactions   Codeine Itching   Hydrocodone-Acetaminophen Itching   Meloxicam Swelling    Face swelling   Montelukast Cough    REVIEW OF SYSTEMS:  *** [X]  denotes positive finding, [ ]  denotes negative finding Cardiac  Comments:  Chest pain or chest pressure:    Shortness of breath upon exertion:    Short of breath when lying flat:    Irregular heart rhythm:        Vascular    Pain in calf, thigh, or hip brought on by ambulation:    Pain in feet at night that wakes you up from your sleep:     Blood clot in your veins:    Leg swelling:  x       Pulmonary    Oxygen at home:    Productive cough:     Wheezing:         Neurologic    Sudden weakness  in arms or legs:     Sudden numbness in arms or legs:     Sudden  onset of difficulty speaking or slurred speech:    Temporary loss of vision in one eye:     Problems with dizziness:         Gastrointestinal    Blood in stool:     Vomited blood:         Genitourinary    Burning when urinating:     Blood in urine:        Psychiatric    Major depression:         Hematologic    Bleeding problems:    Problems with blood clotting too easily:        Skin    Rashes or ulcers:        Constitutional    Fever or chills:      PHYSICAL EXAMINATION:  ***  General:  WDWN in NAD; vital signs documented above Gait: Not observed HENT: WNL, normocephalic Pulmonary: normal non-labored breathing without wheezing Cardiac: {Desc; regular/irreg:14544} HR; {With/Without:20273} carotid bruit*** Abdomen: soft, NT, aortic pulse is *** palpable Skin: {With/Without:20273} rashes Vascular Exam/Pulses:  Right Left  Radial {Exam; arterial pulse strength 0-4:30167} {Exam; arterial pulse strength 0-4:30167}  DP {Exam; arterial pulse strength 0-4:30167} {Exam; arterial pulse strength 0-4:30167}  PT {Exam; arterial pulse strength 0-4:30167} {Exam; arterial pulse strength 0-4:30167}   Extremities: ***  Neurologic: A&O X 3;  moving all extremities equally Psychiatric:  The pt has {Desc; normal/abnormal:11317::"Normal"} affect.   Non-Invasive Vascular Imaging:   Venous duplex on 11/13/2022: ***    Kristen Marsh is a 67 y.o. female who presents with: ***    -pt has *** pedal pulses -pt does *** have evidence of DVT.  Pt does ***have venous reflux *** -discussed with pt about wearing *** high *** mmHg compression stockings and pt was measured for these today.   *** -discussed the importance of leg elevation and how to elevate properly - pt is advised to elevate their legs and a diagram is given to them to demonstrate for pt to lay flat on their back with knees elevated and slightly bent with their feet higher than their knees, which puts their feet higher than  their heart for 15 minutes per day.  If pt cannot lay flat, advised to lay as flat as possible.  -pt is advised to continue as much walking as possible and avoid sitting or standing for long periods of time.  -discussed importance of weight loss and exercise and that water aerobics would also be beneficial.  -handout with recommendations given -pt will f/u ***   Doreatha Massed, [facility] [phone]  Clinic MD:  Edilia Bo

## 2022-11-13 ENCOUNTER — Ambulatory Visit (HOSPITAL_COMMUNITY): Payer: Medicare HMO | Attending: Vascular Surgery

## 2023-01-13 ENCOUNTER — Telehealth: Payer: Self-pay | Admitting: Orthopedic Surgery

## 2023-01-13 NOTE — Telephone Encounter (Signed)
Dr. Dallas Schimke pt - Kristen Marsh w/[facility] [phone] lvm stating that the pt is now enrolled in medication management w/them and will be getting all her medication through them.  She is needing a refill on Oxycodone 5mg .

## 2023-01-15 NOTE — Telephone Encounter (Signed)
Called and left VM for a call back regarding request.

## 2023-01-20 NOTE — Telephone Encounter (Signed)
Left VM at # provided stating pt will not be receiving a refill of medication from this provider.

## 2023-01-20 NOTE — Telephone Encounter (Signed)
  Dr. Dallas Schimke pt - Max w/[facility] [phone] lvm stating that the pt is now enrolled in medication management w/them and will be getting all her medication through them.  She is needing a refill on Oxycodone 5mg .  HIs message stated he is requesting all of her meds be transferred to them.

## 2023-02-05 ENCOUNTER — Encounter (HOSPITAL_COMMUNITY): Payer: Self-pay | Admitting: Emergency Medicine

## 2023-02-05 ENCOUNTER — Emergency Department (HOSPITAL_COMMUNITY)
Admission: EM | Admit: 2023-02-05 | Discharge: 2023-02-06 | Disposition: A | Discharge status: Home / Self Care | Payer: 59 | Attending: Emergency Medicine | Admitting: Emergency Medicine

## 2023-02-05 ENCOUNTER — Other Ambulatory Visit: Payer: Self-pay

## 2023-02-05 DIAGNOSIS — R1084 Generalized abdominal pain: Secondary | ICD-10-CM

## 2023-02-05 DIAGNOSIS — E7220 Disorder of urea cycle metabolism, unspecified: Secondary | ICD-10-CM

## 2023-02-05 DIAGNOSIS — R1010 Upper abdominal pain, unspecified: Secondary | ICD-10-CM | POA: Diagnosis present

## 2023-02-05 DIAGNOSIS — R160 Hepatomegaly, not elsewhere classified: Secondary | ICD-10-CM | POA: Diagnosis not present

## 2023-02-05 DIAGNOSIS — E876 Hypokalemia: Secondary | ICD-10-CM

## 2023-02-05 MED ORDER — ONDANSETRON HCL 4 MG/2ML IJ SOLN
4.0000 mg | Freq: Once | INTRAMUSCULAR | Status: AC
  Administered 2023-02-05: 4 mg via INTRAVENOUS
  Filled 2023-02-05: qty 2

## 2023-02-05 MED ORDER — HYDROMORPHONE HCL 1 MG/ML IJ SOLN
1.0000 mg | Freq: Once | INTRAMUSCULAR | Status: AC
  Administered 2023-02-05: 1 mg via INTRAVENOUS
  Filled 2023-02-05: qty 1

## 2023-02-05 MED ORDER — SODIUM CHLORIDE 0.9 % IV BOLUS
500.0000 mL | Freq: Once | INTRAVENOUS | Status: AC
  Administered 2023-02-05: 500 mL via INTRAVENOUS

## 2023-02-05 NOTE — ED Triage Notes (Signed)
Pt in with sharp upper abdominal pain onset 3 days ago. Pt states pain radiates to bliateral flanks, hx of cirrhosis, and states she has missed a few days of lactulose since she feels so nauseous. Denies any emesis, reports loose 3 stools today

## 2023-02-05 NOTE — ED Provider Notes (Signed)
[place] EMERGENCY DEPARTMENT AT [facility] Provider Note   CSN: [id number] Arrival date & time: 02/05/23  2318     History  Chief Complaint  Patient presents with   Abdominal Pain    Kristen Marsh is a 67 y.o. female.  Presents to the emergency department for evaluation of upper abdominal pain.  Symptoms present for 3 days.  Pain originates from the epigastric area but radiates around both sides.  Patient reports a history of "stomach troubles".  She does take a PPI for this.  She also reports a history of cirrhosis.  She reports having "half of beer" today, no excessive alcohol intake.       Home Medications Prior to Admission medications   Medication Sig Start Date End Date Taking? Authorizing Provider  potassium chloride SA (KLOR-CON M) 20 MEQ tablet Take 1 tablet (20 mEq total) by mouth daily. 02/06/23  Yes Raysean Graumann, Canary Brim, MD  albuterol (PROVENTIL) (2.5 MG/3ML) 0.083% nebulizer solution Take 3 mLs (2.5 mg total) by nebulization every 6 (six) hours as needed for wheezing or shortness of breath. 07/09/15   Junie Spencer, FNP  camphor-menthol [facility]) lotion Apply topically as needed for itching. 01/09/18   Sherryll Burger, Pratik D, DO  cyclobenzaprine (FLEXERIL) 5 MG tablet TAKE ONE TABLET BY MOUTH 3 TIMES DAILY AS NEEDED FOR MUSCLE SPASM(S). 01/05/18   Vassie Loll, MD  diphenhydrAMINE (BENADRYL) 25 MG tablet Take 50 mg by mouth every 6 (six) hours as needed.    [provider]  doxycycline (VIBRAMYCIN) 100 MG capsule Take 1 capsule (100 mg total) by mouth 2 (two) times daily. 02/25/22   Horton, Mayer Masker, MD  ibuprofen (ADVIL,MOTRIN) 600 MG tablet Take 1 tablet (600 mg total) by mouth every 8 (eight) hours as needed for moderate pain. 01/05/18   Vassie Loll, MD  ipratropium-albuterol (DUONEB) 0.5-2.5 (3) MG/3ML SOLN SMARTSIG:1 Vial(s) Via Nebulizer Every 6-8 Hours PRN 11/11/21   [provider]  Multiple Vitamin (MULTIVITAMIN) tablet Take 1 tablet by  mouth daily.    [provider]  omeprazole (PRILOSEC) 20 MG capsule Take 20 mg by mouth 2 (two) times daily. 11/09/21   [provider]  Potassium Gluconate 2 MEQ TABS Take 595 mg by mouth 3 (three) times a week.     [provider]  pregabalin (LYRICA) 75 MG capsule Take 75 mg by mouth 2 (two) times daily. 10/12/21   [provider]  PROVENTIL HFA 108 (90 Base) MCG/ACT inhaler INHALE 2 PUFFS INTO THE LUNGS EVERY FOUR HOURS AS NEEDED FOR WHEEZING. 06/16/16   Mechele Claude, MD  SYMBICORT 160-4.5 MCG/ACT inhaler Inhale 2 puffs into the lungs 2 (two) times daily. 10/08/21   [provider]      Allergies    Codeine, Hydrocodone-acetaminophen, Meloxicam, and Montelukast    Review of Systems   Review of Systems  Physical Exam Updated Vital Signs BP (!) 157/77   Pulse 91   Temp 99.4 F (37.4 C) (Oral)   Resp 18   Wt 68 kg   SpO2 94%   BMI 27.44 kg/m  Physical Exam Vitals and nursing note reviewed.  Constitutional:      General: She is not in acute distress.    Appearance: She is well-developed.  HENT:     Head: Normocephalic and atraumatic.     Mouth/Throat:     Mouth: Mucous membranes are moist.  Eyes:     General: Vision grossly intact. Gaze aligned appropriately.  Extraocular Movements: Extraocular movements intact.     Conjunctiva/sclera: Conjunctivae normal.  Cardiovascular:     Rate and Rhythm: Normal rate and regular rhythm.     Pulses: Normal pulses.     Heart sounds: Normal heart sounds, S1 normal and S2 normal. No murmur heard.    No friction rub. No gallop.  Pulmonary:     Effort: Pulmonary effort is normal. No respiratory distress.     Breath sounds: Normal breath sounds.  Abdominal:     General: Bowel sounds are normal.     Palpations: Abdomen is soft.     Tenderness: There is abdominal tenderness in the epigastric area. There is no guarding or rebound.     Hernia: No hernia is present.  Musculoskeletal:         General: No swelling.     Cervical back: Full passive range of motion without pain, normal range of motion and neck supple. No spinous process tenderness or muscular tenderness. Normal range of motion.     Right lower leg: No edema.     Left lower leg: No edema.  Skin:    General: Skin is warm and dry.     Capillary Refill: Capillary refill takes less than 2 seconds.     Findings: No ecchymosis, erythema, rash or wound.  Neurological:     General: No focal deficit present.     Mental Status: She is alert and oriented to person, place, and time.     GCS: GCS eye subscore is 4. GCS verbal subscore is 5. GCS motor subscore is 6.     Cranial Nerves: Cranial nerves 2-12 are intact.     Sensory: Sensation is intact.     Motor: Motor function is intact.     Coordination: Coordination is intact.  Psychiatric:        Attention and Perception: Attention normal.        Mood and Affect: Mood normal.        Speech: Speech normal.        Behavior: Behavior normal.     ED Results / Procedures / Treatments   Labs (all labs ordered are listed, but only abnormal results are displayed) Labs Reviewed  COMPREHENSIVE METABOLIC PANEL - Abnormal; Notable for the following components:      Result Value   Potassium 3.2 (*)    Glucose, Bld 120 (*)    Calcium 8.6 (*)    AST 51 (*)    Total Bilirubin 1.7 (*)    All other components within normal limits  CBC - Abnormal; Notable for the following components:   WBC 3.8 (*)    RBC 5.23 (*)    RDW 15.6 (*)    Platelets 86 (*)    All other components within normal limits  URINALYSIS, ROUTINE W REFLEX MICROSCOPIC - Abnormal; Notable for the following components:   Color, Urine AMBER (*)    APPearance HAZY (*)    Bilirubin Urine SMALL (*)    Protein, ur 30 (*)    Leukocytes,Ua MODERATE (*)    All other components within normal limits  AMMONIA - Abnormal; Notable for the following components:   Ammonia 103 (*)    All other components within normal  limits  PROTIME-INR - Abnormal; Notable for the following components:   Prothrombin Time 15.5 (*)    All other components within normal limits  LIPASE, BLOOD    EKG None  Radiology CT ABDOMEN PELVIS W CONTRAST  Result Date: 02/06/2023  CLINICAL DATA:  Upper abdominal pain, cirrhosis EXAM: CT ABDOMEN AND PELVIS WITH CONTRAST TECHNIQUE: Multidetector CT imaging of the abdomen and pelvis was performed using the standard protocol following bolus administration of intravenous contrast. RADIATION DOSE REDUCTION: This exam was performed according to the departmental dose-optimization program which includes automated exposure control, adjustment of the mA and/or kV according to patient size and/or use of iterative reconstruction technique. CONTRAST:  OMNIPAQUE IOHEXOL 300 MG/ML  SOLN COMPARISON:  02/28/2015 FINDINGS: Lower chest: Lung bases are clear. Hepatobiliary: Cirrhosis. 19 mm peripheral enhancing lesion in segment 3 (series 2/image 20), poorly evaluated on CT but suspicious for small hepatocellular carcinoma. Gallbladder is unremarkable. No intrahepatic or extrahepatic duct dilatation. Pancreas: Parenchymal calcifications, suggesting sequela of prior/chronic pancreatitis. 5 mm cyst along the pancreatic tail (series 2/image 29), likely reflecting a small pseudocyst in this clinical context. Spleen: Splenomegaly, measuring 17.6 cm in maximal craniocaudal dimension. Adrenals/Urinary Tract: Adrenal glands are within normal limits. Mild left renal parenchymal atrophy. Right kidney is within normal limits. No hydronephrosis. Bladder is underdistended but unremarkable. Stomach/Bowel: Stomach is within normal limits. No evidence of bowel obstruction. Appendix is not discretely visualized. Mild sigmoid diverticulosis, without evidence of diverticulitis. Vascular/Lymphatic: No evidence of abdominal aortic aneurysm. Portal vein is patent. Gastroesophageal and perigastric varices. Recanalized periumbilical vein.  Splenorenal shunt. No suspicious abdominopelvic lymphadenopathy. Reproductive: Status post hysterectomy. No adnexal masses. Other: No abdominopelvic ascites. Musculoskeletal: Mild degenerative changes of the visualized thoracolumbar spine. IMPRESSION: Cirrhosis. 19 mm peripheral enhancing lesion in segment 3, poorly evaluated on CT but suspicious for small hepatocellular carcinoma. Follow-up MRI abdomen with/without contrast is suggested in 3-4 weeks. Splenomegaly. Portal vein is patent. Multifocal varices, as above. No abdominopelvic ascites. Mild sigmoid diverticulosis, without evidence of diverticulitis. Electronically Signed   By: Charline Bills M.D.   On: 02/06/2023 01:26    Procedures Procedures    Medications Ordered in ED Medications  lactulose (CHRONULAC) 10 GM/15ML solution 30 g (has no administration in time range)  sodium chloride 0.9 % bolus 500 mL (0 mLs Intravenous Stopped 02/06/23 0057)  HYDROmorphone (DILAUDID) injection 1 mg (1 mg Intravenous Given 02/05/23 2354)  ondansetron (ZOFRAN) injection 4 mg (4 mg Intravenous Given 02/05/23 2354)  metoCLOPramide (REGLAN) injection 10 mg (10 mg Intravenous Given 02/06/23 0104)  iohexol (OMNIPAQUE) 300 MG/ML solution 100 mL (100 mLs Intravenous Contrast Given 02/06/23 0111)    ED Course/ Medical Decision Making/ A&P                             Medical Decision Making Amount and/or Complexity of Data Reviewed External Data Reviewed: labs, radiology and notes. Labs: ordered. Decision-making details documented in ED Course. Radiology: ordered and independent interpretation performed. Decision-making details documented in ED Course.  Risk Prescription drug management.   Differential Diagnosis considered includes, but not limited to: Cholelithiasis; cholecystitis; cholangitis; bowel obstruction; esophagitis; gastritis; peptic ulcer disease; pancreatitis; cardiac.  Presents to the emergency department for evaluation of abdominal pain.   Patient with mild tenderness on exam across the upper abdomen, no focal tenderness, guarding or rebound.  Majority of lab work unremarkable.  No leukocytosis.  LFTs unremarkable.  Lipase normal.  INR unremarkable.  No anion gap.  Patient with mild hypokalemia.  Additionally her ammonia is elevated.  She admits to missing doses of lactulose.  She is mentating fine.  Will be given lactulose here and get her back on her standard dosing with an increase for 2 days.  CT scan does not show any acute abnormality, there are concerns for lesions in the liver that need to be further evaluated.  This can be performed as an outpatient.        Final Clinical Impression(s) / ED Diagnoses Final diagnoses:  Generalized abdominal pain  Liver mass  Hyperammonemia (HCC)  Hypokalemia    Rx / DC Orders ED Discharge Orders          Ordered    potassium chloride SA (KLOR-CON M) 20 MEQ tablet  Daily        02/06/23 0137              Gilda Crease, MD 02/06/23 [phone]

## 2023-02-06 ENCOUNTER — Emergency Department (HOSPITAL_COMMUNITY): Payer: 59

## 2023-02-06 DIAGNOSIS — R1084 Generalized abdominal pain: Secondary | ICD-10-CM | POA: Diagnosis not present

## 2023-02-06 LAB — URINALYSIS, ROUTINE W REFLEX MICROSCOPIC
Bacteria, UA: NONE SEEN
Glucose, UA: NEGATIVE mg/dL
Hgb urine dipstick: NEGATIVE
Ketones, ur: NEGATIVE mg/dL
Nitrite: NEGATIVE
Protein, ur: 30 mg/dL — AB
Specific Gravity, Urine: 1.028 (ref 1.005–1.030)
pH: 5 (ref 5.0–8.0)

## 2023-02-06 LAB — COMPREHENSIVE METABOLIC PANEL
ALT: 26 U/L (ref 0–44)
AST: 51 U/L — ABNORMAL HIGH (ref 15–41)
Albumin: 3.5 g/dL (ref 3.5–5.0)
Alkaline Phosphatase: 92 U/L (ref 38–126)
Anion gap: 6 (ref 5–15)
BUN: 11 mg/dL (ref 8–23)
CO2: 26 mmol/L (ref 22–32)
Calcium: 8.6 mg/dL — ABNORMAL LOW (ref 8.9–10.3)
Chloride: 105 mmol/L (ref 98–111)
Creatinine, Ser: 0.62 mg/dL (ref 0.44–1.00)
GFR, Estimated: >60 mL/min (ref >60)
Glucose, Bld: 120 mg/dL — ABNORMAL HIGH (ref 70–99)
Potassium: 3.2 mmol/L — ABNORMAL LOW (ref 3.5–5.1)
Sodium: 137 mmol/L (ref 135–145)
Total Bilirubin: 1.7 mg/dL — ABNORMAL HIGH (ref 0.3–1.2)
Total Protein: 7.3 g/dL (ref 6.5–8.1)

## 2023-02-06 LAB — PROTIME-INR
INR: 1.2 (ref 0.8–1.2)
Prothrombin Time: 15.5 seconds — ABNORMAL HIGH (ref 11.4–15.2)

## 2023-02-06 LAB — CBC
HCT: 44.3 % (ref 36.0–46.0)
Hemoglobin: 14.3 g/dL (ref 12.0–15.0)
MCH: 27.3 pg (ref 26.0–34.0)
MCHC: 32.3 g/dL (ref 30.0–36.0)
MCV: 84.7 fL (ref 80.0–100.0)
Platelets: 86 10*3/uL — ABNORMAL LOW (ref 150–400)
RBC: 5.23 MIL/uL — ABNORMAL HIGH (ref 3.87–5.11)
RDW: 15.6 % — ABNORMAL HIGH (ref 11.5–15.5)
WBC: 3.8 10*3/uL — ABNORMAL LOW (ref 4.0–10.5)
nRBC: 0 % (ref 0.0–0.2)

## 2023-02-06 LAB — AMMONIA: Ammonia: 103 umol/L — ABNORMAL HIGH (ref 9–35)

## 2023-02-06 LAB — LIPASE, BLOOD: Lipase: 37 U/L (ref 11–51)

## 2023-02-06 MED ORDER — LACTULOSE 10 GM/15ML PO SOLN
30.0000 g | ORAL | Status: AC
  Administered 2023-02-06: 30 g via ORAL
  Filled 2023-02-06: qty 60

## 2023-02-06 MED ORDER — POTASSIUM CHLORIDE CRYS ER 20 MEQ PO TBCR: 20.0000 meq | EXTENDED_RELEASE_TABLET | Freq: Every day | ORAL | 0 refills | Status: AC

## 2023-02-06 MED ORDER — IOHEXOL 300 MG/ML  SOLN
100.0000 mL | Freq: Once | INTRAMUSCULAR | Status: AC | PRN
  Administered 2023-02-06: 100 mL via INTRAVENOUS

## 2023-02-06 MED ORDER — METOCLOPRAMIDE HCL 5 MG/ML IJ SOLN
10.0000 mg | Freq: Once | INTRAMUSCULAR | Status: AC
  Administered 2023-02-06: 10 mg via INTRAVENOUS
  Filled 2023-02-06: qty 2

## 2023-02-06 NOTE — ED Notes (Signed)
Patient transported to CT 

## 2023-02-06 NOTE — Discharge Instructions (Addendum)
There are masses in your liver that could be cancer.  You need to follow-up with your gastroenterologist to have this further tested as an outpatient.  Please increase your lactulose dosing to 3 times a day for the next 3 days.

## 2023-02-17 ENCOUNTER — Ambulatory Visit (INDEPENDENT_AMBULATORY_CARE_PROVIDER_SITE_OTHER): Payer: 59 | Admitting: Physician Assistant

## 2023-02-17 ENCOUNTER — Ambulatory Visit (HOSPITAL_COMMUNITY)
Admission: RE | Admit: 2023-02-17 | Discharge: 2023-02-17 | Disposition: A | Discharge status: Home / Self Care | Payer: 59 | Source: Ambulatory Visit | Attending: Physician Assistant | Admitting: Physician Assistant

## 2023-02-17 VITALS — BP 187/92 | HR 83 | Temp 97.4°F | Resp 18 | Ht 62.0 in | Wt 148.4 lb

## 2023-02-17 DIAGNOSIS — M79604 Pain in right leg: Secondary | ICD-10-CM | POA: Diagnosis present

## 2023-02-17 DIAGNOSIS — M79605 Pain in left leg: Secondary | ICD-10-CM | POA: Insufficient documentation

## 2023-02-17 NOTE — Progress Notes (Signed)
[facility] [place]   Reason for referral:B lateral lower leg skin discoloration, possible vein insufficiency   History of Present Illness  Kristen Marsh is a 67 y.o. female who presents with chief complaint: LE skin changes leg.  Patient notes lateral lower leg injuries on separate occasions that changed the skin color and took a while to heal.    The patient has had no history of DVT, no history of varicose vein, no history of venous stasis ulcers, no history of  Lymphedema and positive history of skin changes in lower legs laterally after traumatic injury.  There is unknown family history of venous disorders.  The patient has not used compression stockings in the past.  Her PCP noted the skin change and wanted [facility] to work her up for venous disease.  She denies edema, non healing wounds, heaviness or brawny skin changes circumferentially.  She also denies claudication and rest pain.  She states she has COPD and gets short of breath easily and she has history of lumbar pain without radiculopathy.    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 per pt    Past Surgical History:  Procedure Laterality Date   ABDOMINAL HYSTERECTOMY     APPENDECTOMY     COLONOSCOPY W/ BIOPSIES AND POLYPECTOMY     DIAGNOSTIC LAPAROSCOPY     exploratory lap   DIRECT LARYNGOSCOPY N/A 03/22/2014   Procedure: DIRECT LARYNGOSCOPY;  Surgeon: Melvenia Beam, MD;  Location: [facility] OR;  Service: ENT;  Laterality: N/A;  With biopsy   DIRECT LARYNGOSCOPY N/A 01/15/2015   Procedure: DIRECT LARYNGOSCOPY  WITH BIOPSY;  Surgeon: Newman Pies, MD;  Location: [facility];  Service: ENT;  Laterality: N/A;   ESOPHAGOGASTRODUODENOSCOPY (EGD) WITH PROPOFOL N/A 07/26/2014   Procedure: ESOPHAGOGASTRODUODENOSCOPY (EGD) WITH PROPOFOL;  Surgeon: Malissa Hippo, MD;  Location: [facility];  Service: Endoscopy;  Laterality: N/A;   HYSTEROSCOPY     INCISION AND DRAINAGE Right 01/07/2018   Procedure: INCISION AND DRAINAGE OF ABSCESS RIGHT FOOT;  Surgeon: Ferman Hamming, DPM;  Location: [facility];  Service: Podiatry;  Laterality: Right;    Social History   Socioeconomic History   Marital status: Divorced    Spouse name: Not on file   Number of children: Not on file   Years of education: Not on file   Highest education level: Not on file  Occupational History   Not on file  Tobacco Use   Smoking status: Every Day    Current packs/day: 0.50    Average packs/day: 0.5 packs/day for 40.0 years (20.0 ttl pk-yrs)    Types: Cigarettes   Smokeless tobacco: Never   Tobacco comments:    1/2 pack a day at least 40 yrs  Vaping Use   Vaping status: Never Used  Substance and Sexual Activity   Alcohol use: Yes    Alcohol/week: 4.0 standard drinks of  alcohol    Types: 4 Cans of beer per week    Comment: daily Smirnoff   Drug use: Yes    Types: Cocaine, "Crack" cocaine, Methamphetamines    Comment: last use 3 days ago   Sexual activity: Not Currently    Birth control/protection: Surgical  Other Topics Concern   Not on file  Social History Narrative   Not on file   Social Determinants of Health   Financial Resource Strain: Not on file  Food Insecurity: Not on file  Transportation Needs: Not on file  Physical Activity: Not on file  Stress: Not on file  Social Connections: Unknown (12/15/2021)   Received from [facility]   Social Network    Social Network: Not on file  Intimate Partner Violence: Unknown (11/06/2021)   Received from [facility]   HITS    Physically Hurt: Not on file    Insult  or Talk Down To: Not on file    Threaten Physical Harm: Not on file    Scream or Curse: Not on file    Family History  Problem Relation Age of Onset   Cancer Mother        esophageal   Cancer Brother        Liver cancer and cirrhosis age 75    Current Outpatient Medications on File Prior to Visit  Medication Sig Dispense Refill   albuterol (PROVENTIL) (2.5 MG/3ML) 0.083% nebulizer solution Take 3 mLs (2.5 mg total) by nebulization every 6 (six) hours as needed for wheezing or shortness of breath. (Patient not taking: Reported on 02/17/2023) 75 mL 3   camphor-menthol (SARNA) lotion Apply topically as needed for itching. 222 mL 0   cyclobenzaprine (FLEXERIL) 5 MG tablet TAKE ONE TABLET BY MOUTH 3 TIMES DAILY AS NEEDED FOR MUSCLE SPASM(S). (Patient not taking: Reported on 02/17/2023) 45 tablet 0   diphenhydrAMINE (BENADRYL) 25 MG tablet Take 50 mg by mouth every 6 (six) hours as needed.     doxycycline (VIBRAMYCIN) 100 MG capsule Take 1 capsule (100 mg total) by mouth 2 (two) times daily. 20 capsule 0   ibuprofen (ADVIL,MOTRIN) 600 MG tablet Take 1 tablet (600 mg total) by mouth every 8 (eight) hours as needed for moderate pain. 40 tablet 0   ipratropium-albuterol (DUONEB) 0.5-2.5 (3) MG/3ML SOLN SMARTSIG:1 Vial(s) Via Nebulizer Every 6-8 Hours PRN     Multiple Vitamin (MULTIVITAMIN) tablet Take 1 tablet by mouth daily.     omeprazole (PRILOSEC) 20 MG capsule Take 20 mg by mouth 2 (two) times daily.     potassium chloride SA (KLOR-CON M) 20 MEQ tablet Take 1 tablet (20 mEq total) by mouth daily. 7 tablet 0   Potassium Gluconate 2 MEQ TABS Take 595 mg by mouth 3 (three) times a week.      pregabalin (LYRICA) 75 MG capsule Take 75 mg by mouth 2 (two) times daily.     PROVENTIL HFA 108 (90 Base) MCG/ACT inhaler INHALE 2 PUFFS INTO THE LUNGS EVERY FOUR HOURS AS NEEDED FOR WHEEZING. 6.7 g 5   SYMBICORT 160-4.5 MCG/ACT inhaler Inhale 2 puffs into the lungs 2 (two) times daily.     No current  facility-administered medications on file prior to visit.    Allergies as of 02/17/2023 - Review Complete 02/17/2023  Allergen Reaction Noted   Codeine Itching 12/08/2013   Hydrocodone-acetaminophen Itching 03/10/2011   Meloxicam Swelling 08/16/2013   Montelukast Cough 01/14/2013     ROS:   General:  No weight loss,  Fever, chills  HEENT: No recent headaches, no nasal bleeding, no visual changes, no sore throat  Neurologic: No dizziness, blackouts, seizures. No recent symptoms of stroke or mini- stroke. No recent episodes of slurred speech, or temporary blindness.  Cardiac: No recent episodes of chest pain/pressure, no shortness of breath at rest.  No shortness of breath with exertion.  Denies history of atrial fibrillation or irregular heartbeat  Vascular: No history of rest pain in feet.  No history of claudication.  No history of non-healing ulcer, No history of DVT   Pulmonary: No home oxygen, no productive cough, no hemoptysis,  No asthma or wheezing [X]  COPD Musculoskeletal:  [ ]  Arthritis, [x ] Low back pain,  [ ]  Joint pain  Hematologic:No history of hypercoagulable state.  No history of easy bleeding.  No history of anemia  Gastrointestinal: No hematochezia or melena,  No gastroesophageal reflux, no trouble swallowing  Urinary: [ ]  chronic Kidney disease, [ ]  on HD - [ ]  MWF or [ ]  TTHS, [ ]  Burning with urination, [ ]  Frequent urination, [ ]  Difficulty urinating;   Skin: No rashes  Psychological: No history of anxiety,  No history of depression  Physical Examination  Vitals:   02/17/23 1224  BP: (!) 187/92  Pulse: 83  Resp: 18  Temp: (!) 97.4 F (36.3 C)  TempSrc: Temporal  SpO2: 99%  Weight: 148 lb 6.4 oz (67.3 kg)  Height: 5\' 2"  (1.575 m)    Body mass index is 27.14 kg/m.  General:  Alert and oriented, no acute distress HEENT: Normal Neck: No bruit or JVD Pulmonary: Clear to auscultation bilaterally Cardiac: Regular Rate and Rhythm without  murmur Abdomen: Soft, non-tender, non-distended, no mass, no scars Skin: No rash Extremity Pulses:   radial, femoral, dorsalis pedis, pulses bilaterally Musculoskeletal: No deformity or edema  Neurologic: Upper and lower extremity motor 5/5 and symmetric  DATA: Venous Reflux Times  +--------------+---------+------+-----------+------------+--------+  RIGHT        Reflux NoRefluxReflux TimeDiameter cmsComments                          Yes                                   +--------------+---------+------+-----------+------------+--------+  CFV                    yes   >1 second                       +--------------+---------+------+-----------+------------+--------+  FV prox       no                                              +--------------+---------+------+-----------+------------+--------+  FV dist       no                                              +--------------+---------+------+-----------+------------+--------+  Popliteal    no                                              +--------------+---------+------+-----------+------------+--------+  GSV at [facility]    no                           0.654              +--------------+---------+------+-----------+------------+--------+  GSV prox thighno                            0.21              +--------------+---------+------+-----------+------------+--------+  GSV mid thigh no                           0.141              +--------------+---------+------+-----------+------------+--------+  GSV dist thigh          yes    >500 ms     0.304              +--------------+---------+------+-----------+------------+--------+  GSV at knee   no                           0.195              +--------------+---------+------+-----------+------------+--------+  GSV prox calf no                           0.188               +--------------+---------+------+-----------+------------+--------+  SSV Pop Fossa                              0.114              +--------------+---------+------+-----------+------------+--------+  SSV prox calf                              0.132              +--------------+---------+------+-----------+------------+--------+  SSV mid calf                               0.123              +--------------+---------+------+-----------+------------+--------+     Summary:  Right:  - No evidence of deep vein thrombosis seen in the right lower extremity,  from the common femoral through the popliteal veins.  - No evidence of superficial venous thrombosis in the right lower  extremity.    - Deep vein reflux in the CFV.   - Superficial vein reflux in the GSV at the distal thigh.   Assessment/Plan: The skin changes are only on the lateral aspect of B LE s/p traumatic injuries that took a good while to heal.  She has no symptoms of arterial or venous insufficiency.  She has palpable pedal pulse with multiphasic doppler flow B LE.  She has generalized pains all over, she states she thinks she has spinal issues.  She is not at risk of limb loss and no vascular intervention is indicated.     The venous duplex show 1 area of deep reflux and one area of GSV reflux.  The reflux is very mild and does not cause symptoms.      Mosetta Pigeon  PA-C [facility] [place] Office: [phone]  MD in clinic [place]

## 2024-02-02 DEATH — deceased
